# Patient Record
Sex: Female | Born: 1985 | Race: Black or African American | Hispanic: No | Marital: Single | Smoking: Former smoker
Health system: Southern US, Community
[De-identification: ages and names within clinical notes are randomized; demographics above are authoritative.]

## PROBLEM LIST (undated history)

## (undated) DIAGNOSIS — G932 Benign intracranial hypertension: Secondary | ICD-10-CM

## (undated) DIAGNOSIS — O24419 Gestational diabetes mellitus in pregnancy, unspecified control: Secondary | ICD-10-CM

## (undated) DIAGNOSIS — T7840XA Allergy, unspecified, initial encounter: Secondary | ICD-10-CM

## (undated) DIAGNOSIS — D649 Anemia, unspecified: Secondary | ICD-10-CM

## (undated) DIAGNOSIS — J45909 Unspecified asthma, uncomplicated: Secondary | ICD-10-CM

## (undated) DIAGNOSIS — F209 Schizophrenia, unspecified: Secondary | ICD-10-CM

## (undated) HISTORY — PX: WISDOM TOOTH EXTRACTION: SHX21

## (undated) HISTORY — DX: Unspecified asthma, uncomplicated: J45.909

## (undated) HISTORY — DX: Gestational diabetes mellitus in pregnancy, unspecified control: O24.419

## (undated) HISTORY — DX: Allergy, unspecified, initial encounter: T78.40XA

## (undated) HISTORY — PX: NO PAST SURGERIES: SHX2092

---

## 2017-01-17 DIAGNOSIS — R0789 Other chest pain: Secondary | ICD-10-CM | POA: Diagnosis not present

## 2017-01-17 DIAGNOSIS — F17210 Nicotine dependence, cigarettes, uncomplicated: Secondary | ICD-10-CM | POA: Diagnosis not present

## 2017-01-17 DIAGNOSIS — J45909 Unspecified asthma, uncomplicated: Secondary | ICD-10-CM | POA: Diagnosis not present

## 2017-01-28 DIAGNOSIS — M79672 Pain in left foot: Secondary | ICD-10-CM | POA: Diagnosis not present

## 2017-01-28 DIAGNOSIS — M7989 Other specified soft tissue disorders: Secondary | ICD-10-CM | POA: Diagnosis not present

## 2017-01-28 DIAGNOSIS — F17210 Nicotine dependence, cigarettes, uncomplicated: Secondary | ICD-10-CM | POA: Diagnosis not present

## 2017-02-12 DIAGNOSIS — R252 Cramp and spasm: Secondary | ICD-10-CM | POA: Diagnosis not present

## 2018-05-14 ENCOUNTER — Encounter: Payer: Self-pay | Admitting: Family Medicine

## 2018-05-14 ENCOUNTER — Ambulatory Visit (INDEPENDENT_AMBULATORY_CARE_PROVIDER_SITE_OTHER): Payer: BLUE CROSS/BLUE SHIELD | Admitting: Family Medicine

## 2018-05-14 ENCOUNTER — Other Ambulatory Visit: Payer: Self-pay

## 2018-05-14 VITALS — BP 114/73 | HR 76 | Temp 98.9°F | Ht 65.0 in | Wt 175.0 lb

## 2018-05-14 DIAGNOSIS — N760 Acute vaginitis: Secondary | ICD-10-CM

## 2018-05-14 DIAGNOSIS — Z30431 Encounter for routine checking of intrauterine contraceptive device: Secondary | ICD-10-CM

## 2018-05-14 DIAGNOSIS — N898 Other specified noninflammatory disorders of vagina: Secondary | ICD-10-CM

## 2018-05-14 DIAGNOSIS — B9689 Other specified bacterial agents as the cause of diseases classified elsewhere: Secondary | ICD-10-CM

## 2018-05-14 DIAGNOSIS — F439 Reaction to severe stress, unspecified: Secondary | ICD-10-CM

## 2018-05-14 LAB — WET PREP FOR TRICH, YEAST, CLUE
CLUE CELL EXAM: POSITIVE — AB
TRICHOMONAS EXAM: NEGATIVE
YEAST EXAM: NEGATIVE

## 2018-05-14 MED ORDER — METRONIDAZOLE 500 MG PO TABS: 500.0000 mg | ORAL_TABLET | Freq: Two times a day (BID) | ORAL | 0 refills | Status: DC

## 2018-05-14 NOTE — Patient Instructions (Addendum)
Shoulder Exercises Ask your health care provider which exercises are safe for you. Do exercises exactly as told by your health care provider and adjust them as directed. It is normal to feel mild stretching, pulling, tightness, or discomfort as you do these exercises, but you should stop right away if you feel sudden pain or your pain gets worse.Do not begin these exercises until told by your health care provider. RANGE OF MOTION EXERCISES These exercises warm up your muscles and joints and improve the movement and flexibility of your shoulder. These exercises also help to relieve pain, numbness, and tingling. These exercises involve stretching your injured shoulder directly. Exercise A: Pendulum  1. Stand near a wall or a surface that you can hold onto for balance. 2. Bend at the waist and let your left / right arm hang straight down. Use your other arm to support you. Keep your back straight and do not lock your knees. 3. Relax your left / right arm and shoulder muscles, and move your hips and your trunk so your left / right arm swings freely. Your arm should swing because of the motion of your body, not because you are using your arm or shoulder muscles. 4. Keep moving your body so your arm swings in the following directions, as told by your health care provider: ? Side to side. ? Forward and backward. ? In clockwise and counterclockwise circles. 5. Continue each motion for __________ seconds, or for as long as told by your health care provider. 6. Slowly return to the starting position. Repeat __________ times. Complete this exercise __________ times a day. Exercise B:Flexion, Standing  1. Stand and hold a broomstick, a cane, or a similar object. Place your hands a little more than shoulder-width apart on the object. Your left / right hand should be palm-up, and your other hand should be palm-down. 2. Keep your elbow straight and keep your shoulder muscles relaxed. Push the stick down with  your healthy arm to raise your left / right arm in front of your body, and then over your head until you feel a stretch in your shoulder. ? Avoid shrugging your shoulder while you raise your arm. Keep your shoulder blade tucked down toward the middle of your back. 3. Hold for __________ seconds. 4. Slowly return to the starting position. Repeat __________ times. Complete this exercise __________ times a day. Exercise C: Abduction, Standing 1. Stand and hold a broomstick, a cane, or a similar object. Place your hands a little more than shoulder-width apart on the object. Your left / right hand should be palm-up, and your other hand should be palm-down. 2. While keeping your elbow straight and your shoulder muscles relaxed, push the stick across your body toward your left / right side. Raise your left / right arm to the side of your body and then over your head until you feel a stretch in your shoulder. ? Do not raise your arm above shoulder height, unless your health care provider tells you to do that. ? Avoid shrugging your shoulder while you raise your arm. Keep your shoulder blade tucked down toward the middle of your back. 3. Hold for __________ seconds. 4. Slowly return to the starting position. Repeat __________ times. Complete this exercise __________ times a day. Exercise D:Internal Rotation  1. Place your left / right hand behind your back, palm-up. 2. Use your other hand to dangle an exercise band, a towel, or a similar object over your shoulder. Grasp the band with   your left / right hand so you are holding onto both ends. 3. Gently pull up on the band until you feel a stretch in the front of your left / right shoulder. ? Avoid shrugging your shoulder while you raise your arm. Keep your shoulder blade tucked down toward the middle of your back. 4. Hold for __________ seconds. 5. Release the stretch by letting go of the band and lowering your hands. Repeat __________ times. Complete  this exercise __________ times a day. STRETCHING EXERCISES These exercises warm up your muscles and joints and improve the movement and flexibility of your shoulder. These exercises also help to relieve pain, numbness, and tingling. These exercises are done using your healthy shoulder to help stretch the muscles of your injured shoulder. Exercise E: Corner Stretch (External Rotation and Abduction)  1. Stand in a doorway with one of your feet slightly in front of the other. This is called a staggered stance. If you cannot reach your forearms to the door frame, stand facing a corner of a room. 2. Choose one of the following positions as told by your health care provider: ? Place your hands and forearms on the door frame above your head. ? Place your hands and forearms on the door frame at the height of your head. ? Place your hands on the door frame at the height of your elbows. 3. Slowly move your weight onto your front foot until you feel a stretch across your chest and in the front of your shoulders. Keep your head and chest upright and keep your abdominal muscles tight. 4. Hold for __________ seconds. 5. To release the stretch, shift your weight to your back foot. Repeat __________ times. Complete this stretch __________ times a day. Exercise F:Extension, Standing 1. Stand and hold a broomstick, a cane, or a similar object behind your back. ? Your hands should be a little wider than shoulder-width apart. ? Your palms should face away from your back. 2. Keeping your elbows straight and keeping your shoulder muscles relaxed, move the stick away from your body until you feel a stretch in your shoulder. ? Avoid shrugging your shoulders while you move the stick. Keep your shoulder blade tucked down toward the middle of your back. 3. Hold for __________ seconds. 4. Slowly return to the starting position. Repeat __________ times. Complete this exercise __________ times a day. STRENGTHENING  EXERCISES These exercises build strength and endurance in your shoulder. Endurance is the ability to use your muscles for a long time, even after they get tired. Exercise G:External Rotation  1. Sit in a stable chair without armrests. 2. Secure an exercise band at elbow height on your left / right side. 3. Place a soft object, such as a folded towel or a small pillow, between your left / right upper arm and your body to move your elbow a few inches away (about 10 cm) from your side. 4. Hold the end of the band so it is tight and there is no slack. 5. Keeping your elbow pressed against the soft object, move your left / right forearm out, away from your abdomen. Keep your body steady so only your forearm moves. 6. Hold for __________ seconds. 7. Slowly return to the starting position. Repeat __________ times. Complete this exercise __________ times a day. Exercise H:Shoulder Abduction  1. Sit in a stable chair without armrests, or stand. 2. Hold a __________ weight in your left / right hand, or hold an exercise band with both hands.   3. Start with your arms straight down and your left / right palm facing in, toward your body. 4. Slowly lift your left / right hand out to your side. Do not lift your hand above shoulder height unless your health care provider tells you that this is safe. ? Keep your arms straight. ? Avoid shrugging your shoulder while you do this movement. Keep your shoulder blade tucked down toward the middle of your back. 5. Hold for __________ seconds. 6. Slowly lower your arm, and return to the starting position. Repeat __________ times. Complete this exercise __________ times a day. Exercise I:Shoulder Extension 1. Sit in a stable chair without armrests, or stand. 2. Secure an exercise band to a stable object in front of you where it is at shoulder height. 3. Hold one end of the exercise band in each hand. Your palms should face each other. 4. Straighten your elbows and  lift your hands up to shoulder height. 5. Step back, away from the secured end of the exercise band, until the band is tight and there is no slack. 6. Squeeze your shoulder blades together as you pull your hands down to the sides of your thighs. Stop when your hands are straight down by your sides. Do not let your hands go behind your body. 7. Hold for __________ seconds. 8. Slowly return to the starting position. Repeat __________ times. Complete this exercise __________ times a day. Exercise J:Standing Shoulder Row 1. Sit in a stable chair without armrests, or stand. 2. Secure an exercise band to a stable object in front of you so it is at waist height. 3. Hold one end of the exercise band in each hand. Your palms should be in a thumbs-up position. 4. Bend each of your elbows to an "L" shape (about 90 degrees) and keep your upper arms at your sides. 5. Step back until the band is tight and there is no slack. 6. Slowly pull your elbows back behind you. 7. Hold for __________ seconds. 8. Slowly return to the starting position. Repeat __________ times. Complete this exercise __________ times a day. Exercise K:Shoulder Press-Ups  1. Sit in a stable chair that has armrests. Sit upright, with your feet flat on the floor. 2. Put your hands on the armrests so your elbows are bent and your fingers are pointing forward. Your hands should be about even with the sides of your body. 3. Push down on the armrests and use your arms to lift yourself off of the chair. Straighten your elbows and lift yourself up as much as you comfortably can. ? Move your shoulder blades down, and avoid letting your shoulders move up toward your ears. ? Keep your feet on the ground. As you get stronger, your feet should support less of your body weight as you lift yourself up. 4. Hold for __________ seconds. 5. Slowly lower yourself back into the chair. Repeat __________ times. Complete this exercise __________ times a  day. Exercise L: Wall Push-Ups  1. Stand so you are facing a stable wall. Your feet should be about one arm-length away from the wall. 2. Lean forward and place your palms on the wall at shoulder height. 3. Keep your feet flat on the floor as you bend your elbows and lean forward toward the wall. 4. Hold for __________ seconds. 5. Straighten your elbows to push yourself back to the starting position. Repeat __________ times. Complete this exercise __________ times a day. This information is not intended to replace advice   given to you by your health care provider. Make sure you discuss any questions you have with your health care provider. Document Released: 06/08/2005 Document Revised: 04/18/2016 Document Reviewed: 04/05/2015 Elsevier Interactive Patient Education  2018 [place] and Stress Management Stress is a normal reaction to life events. It is what you feel when life demands more than you are used to or more than you can handle. Some stress can be useful. For example, the stress reaction can help you catch the last bus of the day, study for a test, or meet a deadline at work. But stress that occurs too often or for too long can cause problems. It can affect your emotional health and interfere with relationships and normal daily activities. Too much stress can weaken your immune system and increase your risk for physical illness. If you already have a medical problem, stress can make it worse. What are the causes? All sorts of life events may cause stress. An event that causes stress for one person may not be stressful for another person. Major life events commonly cause stress. These may be positive or negative. Examples include losing your job, moving into a new home, getting married, having a baby, or losing a loved one. Less obvious life events may also cause stress, especially if they occur day after day or in combination. Examples include working long hours, driving in  traffic, caring for children, being in debt, or being in a difficult relationship. What are the signs or symptoms? Stress may cause emotional symptoms including, the following:  Anxiety. This is feeling worried, afraid, on edge, overwhelmed, or out of control.  Anger. This is feeling irritated or impatient.  Depression. This is feeling sad, down, helpless, or guilty.  Difficulty focusing, remembering, or making decisions.  Stress may cause physical symptoms, including the following:  Aches and pains. These may affect your head, neck, back, stomach, or other areas of your body.  Tight muscles or clenched jaw.  Low energy or trouble sleeping.  Stress may cause unhealthy behaviors, including the following:  Eating to feel better (overeating) or skipping meals.  Sleeping too little, too much, or both.  Working too much or putting off tasks (procrastination).  Smoking, drinking alcohol, or using drugs to feel better.  How is this diagnosed? Stress is diagnosed through an assessment by your health care provider. Your health care provider will ask questions about your symptoms and any stressful life events.Your health care provider will also ask about your medical history and may order blood tests or other tests. Certain medical conditions and medicine can cause physical symptoms similar to stress. Mental illness can cause emotional symptoms and unhealthy behaviors similar to stress. Your health care provider may refer you to a mental health professional for further evaluation. How is this treated? Stress management is the recommended treatment for stress.The goals of stress management are reducing stressful life events and coping with stress in healthy ways. Techniques for reducing stressful life events include the following:  Stress identification. Self-monitor for stress and identify what causes stress for you. These skills may help you to avoid some stressful events.  Time  management. Set your priorities, keep a calendar of events, and learn to say "no." These tools can help you avoid making too many commitments.  Techniques for coping with stress include the following:  Rethinking the problem. Try to think realistically about stressful events rather than ignoring them or overreacting. Try to find the positives in a stressful situation  rather than focusing on the negatives.  Exercise. Physical exercise can release both physical and emotional tension. The key is to find a form of exercise you enjoy and do it regularly.  Relaxation techniques. These relax the body and mind. Examples include yoga, meditation, tai chi, biofeedback, deep breathing, progressive muscle relaxation, listening to music, being out in nature, journaling, and other hobbies. Again, the key is to find one or more that you enjoy and can do regularly.  Healthy lifestyle. Eat a balanced diet, get plenty of sleep, and do not smoke. Avoid using alcohol or drugs to relax.  Strong support network. Spend time with family, friends, or other people you enjoy being around.Express your feelings and talk things over with someone you trust.  Counseling or talktherapy with a mental health professional may be helpful if you are having difficulty managing stress on your own. Medicine is typically not recommended for the treatment of stress.Talk to your health care provider if you think you need medicine for symptoms of stress. Follow these instructions at home:  Keep all follow-up visits as directed by your health care provider.  Take all medicines as directed by your health care provider. Contact a health care provider if:  Your symptoms get worse or you start having new symptoms.  You feel overwhelmed by your problems and can no longer manage them on your own. Get help right away if:  You feel like hurting yourself or someone else. This information is not intended to replace advice given to you by  your health care provider. Make sure you discuss any questions you have with your health care provider. Document Released: 01/18/2001 Document Revised: 12/31/2015 Document Reviewed: 03/19/2013 Elsevier Interactive Patient Education  2017 [employer]  Bacterial Vaginosis Bacterial vaginosis is a vaginal infection that occurs when the normal balance of bacteria in the vagina is disrupted. It results from an overgrowth of certain bacteria. This is the most common vaginal infection among women ages 99-44. Because bacterial vaginosis increases your risk for STIs (sexually transmitted infections), getting treated can help reduce your risk for chlamydia, gonorrhea, herpes, and HIV (human immunodeficiency virus). Treatment is also important for preventing complications in pregnant women, because this condition can cause an early (premature) delivery. What are the causes? This condition is caused by an increase in harmful bacteria that are normally present in small amounts in the vagina. However, the reason that the condition develops is not fully understood. What increases the risk? The following factors may make you more likely to develop this condition:  Having a new sexual partner or multiple sexual partners.  Having unprotected sex.  Douching.  Having an intrauterine device (IUD).  Smoking.  Drug and alcohol abuse.  Taking certain antibiotic medicines.  Being pregnant.  You cannot get bacterial vaginosis from toilet seats, bedding, swimming pools, or contact with objects around you. What are the signs or symptoms? Symptoms of this condition include:  Grey or white vaginal discharge. The discharge can also be watery or foamy.  A fish-like odor with discharge, especially after sexual intercourse or during menstruation.  Itching in and around the vagina.  Burning or pain with urination.  Some women with bacterial vaginosis have no signs or symptoms. How is this diagnosed? This  condition is diagnosed based on:  Your medical history.  A physical exam of the vagina.  Testing a sample of vaginal fluid under a microscope to look for a large amount of bad bacteria or abnormal cells. Your health care provider  may use a cotton swab or a small wooden spatula to collect the sample.  How is this treated? This condition is treated with antibiotics. These may be given as a pill, a vaginal cream, or a medicine that is put into the vagina (suppository). If the condition comes back after treatment, a second round of antibiotics may be needed. Follow these instructions at home: Medicines  Take over-the-counter and prescription medicines only as told by your health care provider.  Take or use your antibiotic as told by your health care provider. Do not stop taking or using the antibiotic even if you start to feel better. General instructions  If you have a female sexual partner, tell her that you have a vaginal infection. She should see her health care provider and be treated if she has symptoms. If you have a female sexual partner, he does not need treatment.  During treatment: ? Avoid sexual activity until you finish treatment. ? Do not douche. ? Avoid alcohol as directed by your health care provider. ? Avoid breastfeeding as directed by your health care provider.  Drink enough water and fluids to keep your urine clear or pale yellow.  Keep the area around your vagina and rectum clean. ? Wash the area daily with warm water. ? Wipe yourself from front to back after using the toilet.  Keep all follow-up visits as told by your health care provider. This is important. How is this prevented?  Do not douche.  Wash the outside of your vagina with warm water only.  Use protection when having sex. This includes latex condoms and dental dams.  Limit how many sexual partners you have. To help prevent bacterial vaginosis, it is best to have sex with just one partner  (monogamous).  Make sure you and your sexual partner are tested for STIs.  Wear cotton or cotton-lined underwear.  Avoid wearing tight pants and pantyhose, especially during summer.  Limit the amount of alcohol that you drink.  Do not use any products that contain nicotine or tobacco, such as cigarettes and e-cigarettes. If you need help quitting, ask your health care provider.  Do not use illegal drugs. Where to find more information:  Centers for Disease Control and Prevention: AppraiserFraud.fi  American Sexual Health Association (ASHA): www.ashastd.org  [place] Department of Health and Financial controller, Office on Women's Health: DustingSprays.pl or SecuritiesCard.it Contact a health care provider if:  Your symptoms do not improve, even after treatment.  You have more discharge or pain when urinating.  You have a fever.  You have pain in your abdomen.  You have pain during sex.  You have vaginal bleeding between periods. Summary  Bacterial vaginosis is a vaginal infection that occurs when the normal balance of bacteria in the vagina is disrupted.  Because bacterial vaginosis increases your risk for STIs (sexually transmitted infections), getting treated can help reduce your risk for chlamydia, gonorrhea, herpes, and HIV (human immunodeficiency virus). Treatment is also important for preventing complications in pregnant women, because the condition can cause an early (premature) delivery.  This condition is treated with antibiotic medicines. These may be given as a pill, a vaginal cream, or a medicine that is put into the vagina (suppository). This information is not intended to replace advice given to you by your health care provider. Make sure you discuss any questions you have with your health care provider. Document Released: 07/25/2005 Document Revised: 11/28/2016 Document Reviewed: 04/09/2016 Elsevier Interactive Patient  Education  [employer].

## 2018-05-14 NOTE — Progress Notes (Signed)
BP 114/73   Pulse 76   Temp 98.9 F (37.2 C) (Oral)   Ht 5\' 5"  (1.651 m)   Wt 175 lb (79.4 kg)   SpO2 94%   BMI 29.12 kg/m    Subjective:    Patient ID: Caitlin Ortega, female    DOB: 28-Feb-1986, 32 y.o.   MRN: [id number]  HPI: Caitlin Ortega is a 32 y.o. female who presents today to establish care  Chief Complaint  Patient presents with  . Anxiety  . New Patient (Initial Visit)    pt would like to discuss about her mental health, IUD and having hard times to relax   CONTRACEPTION CONCERNS- has been having issues with BV with her IUD and is concerned with it, has some pelvic pain. Has cramps for about the last year, worse after sex- has the paraguard IUD Contraception: IUD Previous contraception: OCP- got pregnant now, IUD  Sexual activity: monogamous Average interval between menses: 1x a month Length of menses: 7 days Flow: heavy Dysmenorrhea: yes  VAGINAL DISCHARGE Duration: about a year Discharge description: thin clear  Pruritus: no Dysuria: no Malodorous: yes Urinary frequency: no Fevers: no Abdominal pain: yes  Sexual activity: monogamous History of sexually transmitted diseases: no Recent antibiotic use: no Context: recurrent BV  Treatments attempted: metronidazole  ANXIETY/STRESS Duration: since she was a teen- has noticed that her shoulders are very tense Anxious mood: yes  Excessive worrying: yes Irritability: no  Sweating: no Nausea: no Palpitations:no Hyperventilation: no Panic attacks: no Agoraphobia: no  Obscessions/compulsions: no Depressed mood: no Depression screen PHQ 2/9 05/14/2018  Decreased Interest 1  Down, Depressed, Hopeless 1  PHQ - 2 Score 2  Altered sleeping 0  Tired, decreased energy 2  Change in appetite 1  Feeling bad or failure about yourself  3  Trouble concentrating 1  Moving slowly or fidgety/restless 1  Suicidal thoughts 0  PHQ-9 Score 10  Difficult doing work/chores Not difficult at all   GAD 7 : Generalized  Anxiety Score 05/14/2018  Nervous, Anxious, on Edge 2  Control/stop worrying 2  Worry too much - different things 3  Trouble relaxing 3  Restless 0  Easily annoyed or irritable 1  Afraid - awful might happen 2  Total GAD 7 Score 13  Anxiety Difficulty Somewhat difficult   Anhedonia: no Weight changes: no Insomnia: no   Hypersomnia: no Fatigue/loss of energy: no Feelings of worthlessness: no Feelings of guilt: no Impaired concentration/indecisiveness: no Suicidal ideations: no  Crying spells: no Recent Stressors/Life Changes: no   Relationship problems: no   Family stress: yes     Financial stress: no    Job stress: no    Recent death/loss: no  Active Ambulatory Problems    Diagnosis Date Noted  . No Active Ambulatory Problems   Resolved Ambulatory Problems    Diagnosis Date Noted  . No Resolved Ambulatory Problems   Past Medical History:  Diagnosis Date  . Allergy   . Asthma    History reviewed. No pertinent surgical history. Outpatient Encounter Medications as of 05/14/2018  Medication Sig  . levonorgestrel (MIRENA) 20 MCG/24HR IUD by Intrauterine route.  . metroNIDAZOLE (FLAGYL) 500 MG tablet Take 1 tablet (500 mg total) by mouth 2 (two) times daily.   No facility-administered encounter medications on file as of 05/14/2018.    No Known Allergies Social History   Socioeconomic History  . Marital status: Not on file    Spouse name: Not on file  . Number of  children: Not on file  . Years of education: Not on file  . Highest education level: Not on file  Occupational History  . Not on file  Social Needs  . Financial resource strain: Not on file  . Food insecurity:    Worry: Not on file    Inability: Not on file  . Transportation needs:    Medical: Not on file    Non-medical: Not on file  Tobacco Use  . Smoking status: Former Research scientist (life sciences)  . Smokeless tobacco: Never Used  Substance and Sexual Activity  . Alcohol use: Not Currently  . Drug use: Never  .  Sexual activity: Yes  Lifestyle  . Physical activity:    Days per week: Not on file    Minutes per session: Not on file  . Stress: Not on file  Relationships  . Social connections:    Talks on phone: Not on file    Gets together: Not on file    Attends religious service: Not on file    Active member of club or organization: Not on file    Attends meetings of clubs or organizations: Not on file    Relationship status: Not on file  Other Topics Concern  . Not on file  Social History Narrative  . Not on file   Family History  Problem Relation Age of Onset  . Hypertension Mother   . Hypertension Sister   . Heart disease Maternal Grandfather   . Diabetes Maternal Grandfather     Review of Systems  Constitutional: Negative.   Respiratory: Negative.   Cardiovascular: Negative.   Musculoskeletal: Positive for myalgias. Negative for arthralgias, back pain, gait problem, joint swelling, neck pain and neck stiffness.  Skin: Negative.   Psychiatric/Behavioral: Negative.     Per HPI unless specifically indicated above     Objective:    BP 114/73   Pulse 76   Temp 98.9 F (37.2 C) (Oral)   Ht 5\' 5"  (1.651 m)   Wt 175 lb (79.4 kg)   SpO2 94%   BMI 29.12 kg/m   Wt Readings from Last 3 Encounters:  05/14/18 175 lb (79.4 kg)    Physical Exam  Constitutional: She is oriented to person, place, and time. She appears well-developed and well-nourished. No distress.  HENT:  Head: Normocephalic and atraumatic.  Right Ear: Hearing normal.  Left Ear: Hearing normal.  Nose: Nose normal.  Eyes: Conjunctivae and lids are normal. Right eye exhibits no discharge. Left eye exhibits no discharge. No scleral icterus.  Cardiovascular: Normal rate, regular rhythm, normal heart sounds and intact distal pulses. Exam reveals no gallop and no friction rub.  No murmur heard. Pulmonary/Chest: Effort normal and breath sounds normal. No stridor. No respiratory distress. She has no wheezes. She has  no rales. She exhibits no tenderness.  Genitourinary: Rectal exam shows guaiac negative stool. Vaginal discharge found.  Musculoskeletal: Normal range of motion.  Neurological: She is alert and oriented to person, place, and time.  Skin: Skin is warm, dry and intact. Capillary refill takes less than 2 seconds. No rash noted. She is not diaphoretic. No erythema. No pallor.  Psychiatric: She has a normal mood and affect. Her speech is normal and behavior is normal. Judgment and thought content normal. Cognition and memory are normal.  Nursing note and vitals reviewed.   No results found for this or any previous visit.    Assessment & Plan:   Problem List Items Addressed This Visit    None  Visit Diagnoses    BV (bacterial vaginosis)    -  Primary   Wil treat with metronidazole. Call with any concerns.    Relevant Medications   metroNIDAZOLE (FLAGYL) 500 MG tablet   Vaginal discharge       WIll check wet prep. Call with any concerns. +BV   Relevant Orders   WET PREP FOR TRICH, YEAST, CLUE   Stress       Discussed breathing exercises and shoulder stretches. Continue to monitor. Call with any concerns.    Encounter for management of intrauterine contraceptive device (IUD), unspecified IUD management type       Will continue with paraguard. Call with any concerns.        Follow up plan: Return if symptoms worsen or fail to improve, for Physical.

## 2018-11-09 ENCOUNTER — Encounter: Payer: BLUE CROSS/BLUE SHIELD | Admitting: Family Medicine

## 2019-01-28 ENCOUNTER — Other Ambulatory Visit: Payer: Self-pay

## 2019-01-28 ENCOUNTER — Ambulatory Visit (INDEPENDENT_AMBULATORY_CARE_PROVIDER_SITE_OTHER): Payer: Medicaid Other | Admitting: Family Medicine

## 2019-01-28 ENCOUNTER — Encounter: Payer: Self-pay | Admitting: Family Medicine

## 2019-01-28 ENCOUNTER — Other Ambulatory Visit (HOSPITAL_COMMUNITY)
Admission: RE | Admit: 2019-01-28 | Discharge: 2019-01-28 | Disposition: A | Discharge status: Home / Self Care | Payer: Medicaid Other | Source: Ambulatory Visit | Attending: Family Medicine | Admitting: Family Medicine

## 2019-01-28 VITALS — BP 106/72 | HR 57 | Temp 99.3°F | Ht 65.0 in | Wt 190.0 lb

## 2019-01-28 DIAGNOSIS — Z124 Encounter for screening for malignant neoplasm of cervix: Secondary | ICD-10-CM

## 2019-01-28 DIAGNOSIS — Z0000 Encounter for general adult medical examination without abnormal findings: Secondary | ICD-10-CM | POA: Diagnosis not present

## 2019-01-28 DIAGNOSIS — M545 Low back pain, unspecified: Secondary | ICD-10-CM

## 2019-01-28 DIAGNOSIS — Z30431 Encounter for routine checking of intrauterine contraceptive device: Secondary | ICD-10-CM | POA: Diagnosis not present

## 2019-01-28 LAB — UA/M W/RFLX CULTURE, ROUTINE
Bilirubin, UA: NEGATIVE
Glucose, UA: NEGATIVE
Ketones, UA: NEGATIVE
Leukocytes,UA: NEGATIVE
Nitrite, UA: NEGATIVE
Protein,UA: NEGATIVE
RBC, UA: NEGATIVE
Specific Gravity, UA: 1.025 (ref 1.005–1.030)
Urobilinogen, Ur: 0.2 mg/dL (ref 0.2–1.0)
pH, UA: 5 (ref 5.0–7.5)

## 2019-01-28 NOTE — Progress Notes (Signed)
BP 106/72   Pulse (!) 57   Temp 99.3 F (37.4 C) (Oral)   Ht 5\' 5"  (1.651 m)   Wt 190 lb (86.2 kg)   SpO2 98%   BMI 31.62 kg/m    Subjective:    Patient ID: Caitlin Ortega, female    DOB: 08/30/1985, 33 y.o.   MRN: [id number]  HPI: Caitlin Ortega is a 33 y.o. female presenting on 01/28/2019 for comprehensive medical examination. Current medical complaints include:  Has been having some issues with back pain. Would like to see a chiropractor. Would like referral for that.   She is not sure if she has a mirena or paraguard IUD- was going to have mirena placed at [facility] in 2013, but then states that she had another placed through [facility] in 2013. She really wanted the paragard and thought that was what was placed, but now she's unsure.  Menopausal Symptoms: no  Depression Screen done today and results listed below:  Depression screen [facility] 2/9 01/28/2019 05/14/2018  Decreased Interest 0 1  Down, Depressed, Hopeless 0 1  PHQ - 2 Score 0 2  Altered sleeping 3 0  Tired, decreased energy 1 2  Change in appetite 0 1  Feeling bad or failure about yourself  0 3  Trouble concentrating 0 1  Moving slowly or fidgety/restless 0 1  Suicidal thoughts 0 0  PHQ-9 Score 4 10  Difficult doing work/chores Not difficult at all Not difficult at all    Past Medical History:  Past Medical History:  Diagnosis Date  . Allergy   . Asthma     Surgical History:  History reviewed. No pertinent surgical history.  Medications:  Current Outpatient Medications on File Prior to Visit  Medication Sig  . IBUPROFEN PO Take by mouth daily. 600 mg   No current facility-administered medications on file prior to visit.     Allergies:  No Known Allergies  Social History:  Social History   Socioeconomic History  . Marital status: Single    Spouse name: Not on file  . Number of children: Not on file  . Years of education: Not on file  . Highest education level: Not on file   Occupational History  . Not on file  Social Needs  . Financial resource strain: Not on file  . Food insecurity    Worry: Not on file    Inability: Not on file  . Transportation needs    Medical: Not on file    Non-medical: Not on file  Tobacco Use  . Smoking status: Former Research scientist (life sciences)  . Smokeless tobacco: Never Used  Substance and Sexual Activity  . Alcohol use: Not Currently  . Drug use: Never  . Sexual activity: Yes  Lifestyle  . Physical activity    Days per week: Not on file    Minutes per session: Not on file  . Stress: Not on file  Relationships  . Social Herbalist on phone: Not on file    Gets together: Not on file    Attends religious service: Not on file    Active member of club or organization: Not on file    Attends meetings of clubs or organizations: Not on file    Relationship status: Not on file  . Intimate partner violence    Fear of current or ex partner: Not on file    Emotionally abused: Not on file    Physically abused: Not on file  Forced sexual activity: Not on file  Other Topics Concern  . Not on file  Social History Narrative  . Not on file   Social History   Tobacco Use  Smoking Status Former Smoker  Smokeless Tobacco Never Used   Social History   Substance and Sexual Activity  Alcohol Use Not Currently    Family History:  Family History  Problem Relation Age of Onset  . Hypertension Mother   . Hypertension Sister   . Heart disease Maternal Grandfather   . Diabetes Maternal Grandfather     Past medical history, surgical history, medications, allergies, family history and social history reviewed with patient today and changes made to appropriate areas of the chart.   Review of Systems  Constitutional: Negative.   HENT: Negative.   Eyes: Negative.   Respiratory: Negative.   Cardiovascular: Negative.   Gastrointestinal: Negative.   Genitourinary: Negative.        Heavy menses and pain in her belly occasionally   Musculoskeletal: Positive for back pain. Negative for falls, joint pain, myalgias and neck pain.  Skin: Negative.   Neurological: Negative.   Endo/Heme/Allergies: Negative.   Psychiatric/Behavioral: Negative.     All other ROS negative except what is listed above and in the HPI.      Objective:    BP 106/72   Pulse (!) 57   Temp 99.3 F (37.4 C) (Oral)   Ht 5\' 5"  (1.651 m)   Wt 190 lb (86.2 kg)   SpO2 98%   BMI 31.62 kg/m   Wt Readings from Last 3 Encounters:  01/28/19 190 lb (86.2 kg)  05/14/18 175 lb (79.4 kg)    Physical Exam Vitals signs and nursing note reviewed. Exam conducted with a chaperone present.  Constitutional:      General: She is not in acute distress.    Appearance: Normal appearance. She is not ill-appearing, toxic-appearing or diaphoretic.  HENT:     Head: Normocephalic and atraumatic.     Right Ear: Tympanic membrane, ear canal and external ear normal. There is no impacted cerumen.     Left Ear: Tympanic membrane, ear canal and external ear normal. There is no impacted cerumen.     Nose: Nose normal. No congestion or rhinorrhea.     Mouth/Throat:     Mouth: Mucous membranes are moist.     Pharynx: Oropharynx is clear. No oropharyngeal exudate or posterior oropharyngeal erythema.  Eyes:     General: No scleral icterus.       Right eye: No discharge.        Left eye: No discharge.     Extraocular Movements: Extraocular movements intact.     Conjunctiva/sclera: Conjunctivae normal.     Pupils: Pupils are equal, round, and reactive to light.  Neck:     Musculoskeletal: Normal range of motion and neck supple. No neck rigidity or muscular tenderness.     Vascular: No carotid bruit.  Cardiovascular:     Rate and Rhythm: Normal rate and regular rhythm.     Pulses: Normal pulses.     Heart sounds: No murmur. No friction rub. No gallop.   Pulmonary:     Effort: Pulmonary effort is normal. No respiratory distress.     Breath sounds: Normal breath  sounds. No stridor. No wheezing, rhonchi or rales.  Chest:     Chest wall: No tenderness.     Breasts:        Right: Normal. No swelling, bleeding, inverted nipple, mass,  nipple discharge, skin change or tenderness.        Left: Normal. No swelling, bleeding, inverted nipple, mass, nipple discharge, skin change or tenderness.  Abdominal:     General: Abdomen is flat. Bowel sounds are normal. There is no distension.     Palpations: Abdomen is soft. There is no mass.     Tenderness: There is no abdominal tenderness. There is no right CVA tenderness, left CVA tenderness, guarding or rebound.     Hernia: No hernia is present.  Genitourinary:    Labia:        Right: No rash, tenderness, lesion or injury.        Left: No rash, tenderness, lesion or injury.      Urethra: No prolapse, urethral pain, urethral swelling or urethral lesion.     Vagina: Normal. No signs of injury and foreign body. No vaginal discharge, erythema, tenderness, bleeding, lesions or prolapsed vaginal walls.     Cervix: Normal. No eversion.  Musculoskeletal:        General: No swelling, tenderness, deformity or signs of injury.     Right lower leg: No edema.     Left lower leg: No edema.  Lymphadenopathy:     Cervical: No cervical adenopathy.  Skin:    General: Skin is warm and dry.     Capillary Refill: Capillary refill takes less than 2 seconds.     Coloration: Skin is not jaundiced or pale.     Findings: No bruising, erythema, lesion or rash.  Neurological:     General: No focal deficit present.     Mental Status: She is alert and oriented to person, place, and time. Mental status is at baseline.     Cranial Nerves: No cranial nerve deficit.     Sensory: No sensory deficit.     Motor: No weakness.     Coordination: Coordination normal.     Gait: Gait normal.     Deep Tendon Reflexes: Reflexes normal.  Psychiatric:        Mood and Affect: Mood normal.        Behavior: Behavior normal.        Thought Content:  Thought content normal.        Judgment: Judgment normal.     Results for orders placed or performed in visit on 05/14/18  WET PREP FOR [place], YEAST, CLUE   Specimen: Vaginal Fluid   VAGINAL FLUI  Result Value Ref Range   Trichomonas Exam Negative Negative   Yeast Exam Negative Negative   Clue Cell Exam Positive (A) Negative      Assessment & Plan:   Problem List Items Addressed This Visit    None    Visit Diagnoses    Routine general medical examination at a health care facility    -  Primary   Vaccines updated. Screening labs checked today. Pap done. Continue diet and exercise. Continue to monitor. Call with any concerns.    Relevant Orders   CBC with Differential/Platelet   Comprehensive metabolic panel   Lipid Panel w/o Chol/HDL Ratio   TSH   HIV Antibody (routine testing w rflx)   UA/M w/rflx Culture, Routine   Screening for cervical cancer       Pap done today, await results.    Relevant Orders   Cytology - PAP   Low back pain without sciatica, unspecified back pain laterality, unspecified chronicity       Would like to see chiropractor. Referral generated today.  Relevant Medications   IBUPROFEN PO   Other Relevant Orders   Ambulatory referral to Chiropractic   Encounter for management of intrauterine contraceptive device (IUD), unspecified IUD management type       Will double check on type of IUD she had placed- records release for [facility] health department signed today.       Follow up plan: Return in about 1 year (around 01/28/2020) for Physical.   LABORATORY TESTING:  - Pap smear: pap done  IMMUNIZATIONS:   - Tdap: Tetanus vaccination status reviewed: last tetanus booster within 10 years, Td vaccination indicated and given today. - Influenza: Postponed to flu season - Pneumovax: Not applicable - HPV: Up to date  PATIENT COUNSELING:   Advised to take 1 mg of folate supplement per day if capable of pregnancy.   Sexuality: Discussed sexually  transmitted diseases, partner selection, use of condoms, avoidance of unintended pregnancy  and contraceptive alternatives.   Advised to avoid cigarette smoking.  I discussed with the patient that most people either abstain from alcohol or drink within safe limits (<=14/week and <=4 drinks/occasion for males, <=7/weeks and <= 3 drinks/occasion for females) and that the risk for alcohol disorders and other health effects rises proportionally with the number of drinks per week and how often a drinker exceeds daily limits.  Discussed cessation/primary prevention of drug use and availability of treatment for abuse.   Diet: Encouraged to adjust caloric intake to maintain  or achieve ideal body weight, to reduce intake of dietary saturated fat and total fat, to limit sodium intake by avoiding high sodium foods and not adding table salt, and to maintain adequate dietary potassium and calcium preferably from fresh fruits, vegetables, and low-fat dairy products.    stressed the importance of regular exercise  Injury prevention: Discussed safety belts, safety helmets, smoke detector, smoking near bedding or upholstery.   Dental health: Discussed importance of regular tooth brushing, flossing, and dental visits.    NEXT PREVENTATIVE PHYSICAL DUE IN 1 YEAR. Return in about 1 year (around 01/28/2020) for Physical.

## 2019-01-28 NOTE — Patient Instructions (Signed)

## 2019-01-29 ENCOUNTER — Telehealth: Payer: Self-pay | Admitting: Family Medicine

## 2019-01-29 DIAGNOSIS — N920 Excessive and frequent menstruation with regular cycle: Secondary | ICD-10-CM

## 2019-01-29 LAB — COMPREHENSIVE METABOLIC PANEL
ALT: 37 IU/L — ABNORMAL HIGH (ref 0–32)
AST: 31 IU/L (ref 0–40)
Albumin/Globulin Ratio: 1.7 (ref 1.2–2.2)
Albumin: 4.7 g/dL (ref 3.9–5.0)
Alkaline Phosphatase: 76 IU/L (ref 39–117)
BUN/Creatinine Ratio: 20 (ref 9–23)
BUN: 13 mg/dL (ref 6–20)
Bilirubin Total: 0.4 mg/dL (ref 0.0–1.2)
CO2: 20 mmol/L (ref 20–29)
Calcium: 10 mg/dL (ref 8.7–10.2)
Chloride: 103 mmol/L (ref 96–106)
Creatinine, Ser: 0.65 mg/dL (ref 0.57–1.00)
GFR calc Af Amer: 138 mL/min/{1.73_m2} (ref >59)
GFR calc non Af Amer: 120 mL/min/{1.73_m2} (ref >59)
Globulin, Total: 2.8 g/dL (ref 1.5–4.5)
Glucose: 80 mg/dL (ref 65–99)
Potassium: 4 mmol/L (ref 3.5–5.2)
Sodium: 137 mmol/L (ref 134–144)
Total Protein: 7.5 g/dL (ref 6.0–8.5)

## 2019-01-29 LAB — CBC WITH DIFFERENTIAL/PLATELET
Basophils Absolute: 0.1 10*3/uL (ref 0.0–0.2)
Basos: 1 %
EOS (ABSOLUTE): 0.2 10*3/uL (ref 0.0–0.4)
Eos: 3 %
Hematocrit: 34.6 % (ref 34.0–46.6)
Hemoglobin: 10.8 g/dL — ABNORMAL LOW (ref 11.1–15.9)
Immature Grans (Abs): 0 10*3/uL (ref 0.0–0.1)
Immature Granulocytes: 0 %
Lymphocytes Absolute: 2.7 10*3/uL (ref 0.7–3.1)
Lymphs: 39 %
MCH: 23.7 pg — ABNORMAL LOW (ref 26.6–33.0)
MCHC: 31.2 g/dL — ABNORMAL LOW (ref 31.5–35.7)
MCV: 76 fL — ABNORMAL LOW (ref 79–97)
Monocytes Absolute: 0.4 10*3/uL (ref 0.1–0.9)
Monocytes: 6 %
Neutrophils Absolute: 3.5 10*3/uL (ref 1.4–7.0)
Neutrophils: 51 %
Platelets: 334 10*3/uL (ref 150–450)
RBC: 4.56 x10E6/uL (ref 3.77–5.28)
RDW: 15.6 % — ABNORMAL HIGH (ref 11.7–15.4)
WBC: 6.9 10*3/uL (ref 3.4–10.8)

## 2019-01-29 LAB — LIPID PANEL W/O CHOL/HDL RATIO
Cholesterol, Total: 173 mg/dL (ref 100–199)
HDL: 47 mg/dL (ref >39)
LDL Calculated: 110 mg/dL — ABNORMAL HIGH (ref 0–99)
Triglycerides: 80 mg/dL (ref 0–149)
VLDL Cholesterol Cal: 16 mg/dL (ref 5–40)

## 2019-01-29 LAB — TSH: TSH: 0.926 u[IU]/mL (ref 0.450–4.500)

## 2019-01-29 LAB — HIV ANTIBODY (ROUTINE TESTING W REFLEX): HIV Screen 4th Generation wRfx: NONREACTIVE

## 2019-01-29 MED ORDER — FERROUS SULFATE 325 (65 FE) MG PO TABS: 325.0000 mg | ORAL_TABLET | Freq: Every day | ORAL | 3 refills | Status: DC

## 2019-01-29 NOTE — Telephone Encounter (Signed)
Please send iron into [facility] Caitlin Ortega

## 2019-01-29 NOTE — Telephone Encounter (Signed)
Please let her know that her labs came back normal except she's a little anemic- I'm going to get her set up for the [place] to make sure she doesn't have fibroids, so they will call her. i've also sent in a iron supplement for her. That should help. Everything else looks great! Thanks!

## 2019-01-30 LAB — CYTOLOGY - PAP
Adequacy: ABSENT
Diagnosis: NEGATIVE
HPV: NOT DETECTED

## 2019-01-31 ENCOUNTER — Encounter: Payer: Self-pay | Admitting: Family Medicine

## 2019-02-01 ENCOUNTER — Telehealth: Payer: Self-pay | Admitting: Family Medicine

## 2019-02-01 NOTE — Telephone Encounter (Signed)
Spoke with Caitlin Ortega who is handling the Pre Certification and has also spoke with [place].  Routing to [place] for documentation on Pre Cert for [place]

## 2019-02-01 NOTE — Telephone Encounter (Signed)
I have not been informed that a PA needed to be done. If there is something I need to fill out- please let me know. Patient has very heavy, painful periods and is anemic because of them, which is why we are trying to get the [place].

## 2019-02-01 NOTE — Telephone Encounter (Signed)
Copied from [place] [id number]. Topic: General - Inquiry >> Feb 01, 2019  8:58 AM Mathis Bud wrote: Reason for CRM: Gabriel Cirri called from [facility] center [facility] regarding patient app 6/29 with [facility], Gabriel Cirri states PCP did not do PA for  US PELVIS COMPLETE Call back [place] # [phone] [phone]

## 2019-02-04 ENCOUNTER — Other Ambulatory Visit: Payer: Self-pay

## 2019-02-04 ENCOUNTER — Ambulatory Visit
Admission: RE | Admit: 2019-02-04 | Discharge: 2019-02-04 | Disposition: A | Discharge status: Home / Self Care | Payer: Medicaid Other | Source: Ambulatory Visit | Attending: Family Medicine | Admitting: Family Medicine

## 2019-02-04 ENCOUNTER — Telehealth: Payer: Self-pay | Admitting: Family Medicine

## 2019-02-04 DIAGNOSIS — N920 Excessive and frequent menstruation with regular cycle: Secondary | ICD-10-CM | POA: Diagnosis not present

## 2019-02-04 DIAGNOSIS — D259 Leiomyoma of uterus, unspecified: Secondary | ICD-10-CM | POA: Diagnosis not present

## 2019-02-04 DIAGNOSIS — N83291 Other ovarian cyst, right side: Secondary | ICD-10-CM | POA: Diagnosis not present

## 2019-02-04 NOTE — Telephone Encounter (Signed)
Patient returned my call and was given results. Patient requested the size of her fibroid. Dr. Wynetta Emery was standing beside me when patient called back so Dr. Wynetta Emery provided the size for the patient. Patient states that since it is fairly small in size, she is going to hold off on GYN right now. States she will let us know if she changes her mind.

## 2019-02-04 NOTE — Telephone Encounter (Signed)
Called and left patient a VM asking for her to please return my call.  

## 2019-02-04 NOTE — Telephone Encounter (Signed)
Please let her know that she does have a fibroid, which is likely why her periods are so heavy and uncomfortable. We can get her into GYN to talk about her options for that if she'd like. She has a little cyst on her ovary from ovulating, everything else looks normal. Thanks!

## 2019-02-19 DIAGNOSIS — M546 Pain in thoracic spine: Secondary | ICD-10-CM | POA: Diagnosis not present

## 2019-02-19 DIAGNOSIS — M9902 Segmental and somatic dysfunction of thoracic region: Secondary | ICD-10-CM | POA: Diagnosis not present

## 2019-02-19 DIAGNOSIS — M531 Cervicobrachial syndrome: Secondary | ICD-10-CM | POA: Diagnosis not present

## 2019-02-19 DIAGNOSIS — M9901 Segmental and somatic dysfunction of cervical region: Secondary | ICD-10-CM | POA: Diagnosis not present

## 2019-02-21 DIAGNOSIS — M531 Cervicobrachial syndrome: Secondary | ICD-10-CM | POA: Diagnosis not present

## 2019-02-21 DIAGNOSIS — M9902 Segmental and somatic dysfunction of thoracic region: Secondary | ICD-10-CM | POA: Diagnosis not present

## 2019-02-21 DIAGNOSIS — M9901 Segmental and somatic dysfunction of cervical region: Secondary | ICD-10-CM | POA: Diagnosis not present

## 2019-02-21 DIAGNOSIS — M546 Pain in thoracic spine: Secondary | ICD-10-CM | POA: Diagnosis not present

## 2019-02-25 DIAGNOSIS — M546 Pain in thoracic spine: Secondary | ICD-10-CM | POA: Diagnosis not present

## 2019-02-25 DIAGNOSIS — M531 Cervicobrachial syndrome: Secondary | ICD-10-CM | POA: Diagnosis not present

## 2019-02-25 DIAGNOSIS — M9902 Segmental and somatic dysfunction of thoracic region: Secondary | ICD-10-CM | POA: Diagnosis not present

## 2019-02-25 DIAGNOSIS — M9901 Segmental and somatic dysfunction of cervical region: Secondary | ICD-10-CM | POA: Diagnosis not present

## 2019-02-27 DIAGNOSIS — M9901 Segmental and somatic dysfunction of cervical region: Secondary | ICD-10-CM | POA: Diagnosis not present

## 2019-02-27 DIAGNOSIS — M9902 Segmental and somatic dysfunction of thoracic region: Secondary | ICD-10-CM | POA: Diagnosis not present

## 2019-02-27 DIAGNOSIS — M531 Cervicobrachial syndrome: Secondary | ICD-10-CM | POA: Diagnosis not present

## 2019-02-27 DIAGNOSIS — M546 Pain in thoracic spine: Secondary | ICD-10-CM | POA: Diagnosis not present

## 2019-03-04 DIAGNOSIS — M9902 Segmental and somatic dysfunction of thoracic region: Secondary | ICD-10-CM | POA: Diagnosis not present

## 2019-03-04 DIAGNOSIS — M9901 Segmental and somatic dysfunction of cervical region: Secondary | ICD-10-CM | POA: Diagnosis not present

## 2019-03-04 DIAGNOSIS — M531 Cervicobrachial syndrome: Secondary | ICD-10-CM | POA: Diagnosis not present

## 2019-03-04 DIAGNOSIS — M546 Pain in thoracic spine: Secondary | ICD-10-CM | POA: Diagnosis not present

## 2019-03-07 DIAGNOSIS — M546 Pain in thoracic spine: Secondary | ICD-10-CM | POA: Diagnosis not present

## 2019-03-07 DIAGNOSIS — M531 Cervicobrachial syndrome: Secondary | ICD-10-CM | POA: Diagnosis not present

## 2019-03-07 DIAGNOSIS — M9902 Segmental and somatic dysfunction of thoracic region: Secondary | ICD-10-CM | POA: Diagnosis not present

## 2019-03-07 DIAGNOSIS — M9901 Segmental and somatic dysfunction of cervical region: Secondary | ICD-10-CM | POA: Diagnosis not present

## 2019-03-13 DIAGNOSIS — M531 Cervicobrachial syndrome: Secondary | ICD-10-CM | POA: Diagnosis not present

## 2019-03-13 DIAGNOSIS — M546 Pain in thoracic spine: Secondary | ICD-10-CM | POA: Diagnosis not present

## 2019-03-13 DIAGNOSIS — M9902 Segmental and somatic dysfunction of thoracic region: Secondary | ICD-10-CM | POA: Diagnosis not present

## 2019-03-13 DIAGNOSIS — M9901 Segmental and somatic dysfunction of cervical region: Secondary | ICD-10-CM | POA: Diagnosis not present

## 2019-03-20 DIAGNOSIS — M9902 Segmental and somatic dysfunction of thoracic region: Secondary | ICD-10-CM | POA: Diagnosis not present

## 2019-03-20 DIAGNOSIS — M531 Cervicobrachial syndrome: Secondary | ICD-10-CM | POA: Diagnosis not present

## 2019-03-20 DIAGNOSIS — M9901 Segmental and somatic dysfunction of cervical region: Secondary | ICD-10-CM | POA: Diagnosis not present

## 2019-03-20 DIAGNOSIS — M546 Pain in thoracic spine: Secondary | ICD-10-CM | POA: Diagnosis not present

## 2019-04-09 ENCOUNTER — Telehealth: Payer: Self-pay | Admitting: Family Medicine

## 2019-04-09 NOTE — Telephone Encounter (Signed)
Routing to provider  

## 2019-04-09 NOTE — Telephone Encounter (Signed)
Pt called to ask if Dr Wynetta Emery could call her to answer some questions re: pt's IUD, specifically whether the IUD was to be in place for 41yrs or 97yrs.  Please call pt to advise: [phone]

## 2019-04-11 NOTE — Telephone Encounter (Signed)
Called [facility], they have to have a signed release. Notified patient she will come in and sign it tomorrow.

## 2019-04-11 NOTE — Telephone Encounter (Signed)
We had requested records- I don't think we ever got them. [facility]- can we confirm whether she has a paraguard or mirana- both are on the chart.

## 2019-08-21 DIAGNOSIS — F411 Generalized anxiety disorder: Secondary | ICD-10-CM | POA: Diagnosis not present

## 2019-08-21 DIAGNOSIS — F331 Major depressive disorder, recurrent, moderate: Secondary | ICD-10-CM | POA: Diagnosis not present

## 2019-09-19 DIAGNOSIS — F331 Major depressive disorder, recurrent, moderate: Secondary | ICD-10-CM | POA: Diagnosis not present

## 2019-09-19 DIAGNOSIS — F411 Generalized anxiety disorder: Secondary | ICD-10-CM | POA: Diagnosis not present

## 2019-09-26 DIAGNOSIS — F331 Major depressive disorder, recurrent, moderate: Secondary | ICD-10-CM | POA: Diagnosis not present

## 2019-10-17 DIAGNOSIS — F331 Major depressive disorder, recurrent, moderate: Secondary | ICD-10-CM | POA: Diagnosis not present

## 2019-11-07 DIAGNOSIS — F331 Major depressive disorder, recurrent, moderate: Secondary | ICD-10-CM | POA: Diagnosis not present

## 2019-11-14 DIAGNOSIS — F331 Major depressive disorder, recurrent, moderate: Secondary | ICD-10-CM | POA: Diagnosis not present

## 2020-01-28 ENCOUNTER — Telehealth: Payer: Self-pay | Admitting: Family Medicine

## 2020-01-28 NOTE — Telephone Encounter (Signed)
Called pt to let her know that Dr Wynetta Emery does remove them, but she does not replace them. No answer left vm.  Copied from [place] [phone]. Topic: Appointment Scheduling - Scheduling Inquiry for Clinic >> Jan 28, 2020 11:16 AM Caitlin Ortega wrote: Reason for CRM: pt would like to know if Dr Wynetta Emery removes IUD? I advised pt I did not think she does, but I woould ask.  Pt got it at health Dept in [place].

## 2020-02-03 ENCOUNTER — Encounter: Payer: Self-pay | Admitting: Family Medicine

## 2020-02-03 ENCOUNTER — Ambulatory Visit (INDEPENDENT_AMBULATORY_CARE_PROVIDER_SITE_OTHER): Payer: Medicaid Other | Admitting: Family Medicine

## 2020-02-03 ENCOUNTER — Other Ambulatory Visit: Payer: Self-pay

## 2020-02-03 VITALS — BP 121/76 | HR 120 | Temp 99.0°F | Ht 64.37 in | Wt 178.4 lb

## 2020-02-03 DIAGNOSIS — Z113 Encounter for screening for infections with a predominantly sexual mode of transmission: Secondary | ICD-10-CM | POA: Diagnosis not present

## 2020-02-03 DIAGNOSIS — Z0000 Encounter for general adult medical examination without abnormal findings: Secondary | ICD-10-CM

## 2020-02-03 DIAGNOSIS — Z30011 Encounter for initial prescription of contraceptive pills: Secondary | ICD-10-CM

## 2020-02-03 DIAGNOSIS — Z1159 Encounter for screening for other viral diseases: Secondary | ICD-10-CM

## 2020-02-03 DIAGNOSIS — Z30432 Encounter for removal of intrauterine contraceptive device: Secondary | ICD-10-CM

## 2020-02-03 LAB — URINALYSIS, ROUTINE W REFLEX MICROSCOPIC
Bilirubin, UA: NEGATIVE
Glucose, UA: NEGATIVE
Ketones, UA: NEGATIVE
Nitrite, UA: NEGATIVE
Protein,UA: NEGATIVE
Specific Gravity, UA: 1.025 (ref 1.005–1.030)
Urobilinogen, Ur: 0.2 mg/dL (ref 0.2–1.0)
pH, UA: 7 (ref 5.0–7.5)

## 2020-02-03 LAB — MICROSCOPIC EXAMINATION

## 2020-02-03 MED ORDER — NORETHIN ACE-ETH ESTRAD-FE 1-20 MG-MCG PO TABS
1.0000 | ORAL_TABLET | Freq: Every day | ORAL | 11 refills | Status: DC
Start: 2020-02-03 — End: 2020-04-07

## 2020-02-03 NOTE — Progress Notes (Signed)
BP 121/76 (BP Location: Left Arm, Patient Position: Sitting, Cuff Size: Normal)   Pulse (!) 120   Temp 99 F (37.2 C) (Oral)   Ht 5' 4.37" (1.635 m)   Wt 178 lb 6.4 oz (80.9 kg)   LMP 01/27/2020 (Exact Date)   BMI 30.27 kg/m    Subjective:    Patient ID: Caitlin Ortega, female    DOB: [birth year], 34 y.o.   MRN: [id number]  HPI: Caitlin Ortega is a 34 y.o. female presenting on 02/03/2020 for comprehensive medical examination. Current medical complaints include:  Had her IUD put in about 7 years ago. Has been having problems since she had it placed.   CONTRACEPTION CONCERNS Contraception: IUD Previous contraception: IUD, OCP  Average interval between menses: 28-30 days Length of menses: 5-7 days Flow: heavy Dysmenorrhea: yes  Menopausal Symptoms: no  Depression Screen done today and results listed below:  Depression screen [facility] 2/9 02/03/2020 01/28/2019 05/14/2018  Decreased Interest 0 0 1  Down, Depressed, Hopeless 0 0 1  PHQ - 2 Score 0 0 2  Altered sleeping - 3 0  Tired, decreased energy - 1 2  Change in appetite - 0 1  Feeling bad or failure about yourself  - 0 3  Trouble concentrating - 0 1  Moving slowly or fidgety/restless - 0 1  Suicidal thoughts - 0 0  PHQ-9 Score - 4 10  Difficult doing work/chores - Not difficult at all Not difficult at all    Past Medical History:  Past Medical History:  Diagnosis Date  . Allergy   . Asthma     Surgical History:  History reviewed. No pertinent surgical history.  Medications:  Current Outpatient Medications on File Prior to Visit  Medication Sig  . BIOTIN PO Take by mouth.  . ferrous sulfate (FERROUSUL) 325 (65 FE) MG tablet Take 1 tablet (325 mg total) by mouth daily with breakfast.  . Multiple Vitamins-Minerals (MULTIVITAMIN WITH MINERALS) tablet Take 1 tablet by mouth daily.  . Omega-3 Fatty Acids (FISH OIL) 1000 MG CAPS Take by mouth.  . IBUPROFEN PO Take by mouth daily. 600 mg (Patient not taking: Reported on  02/03/2020)   No current facility-administered medications on file prior to visit.    Allergies:  No Known Allergies  Social History:  Social History   Socioeconomic History  . Marital status: Single    Spouse name: Not on file  . Number of children: Not on file  . Years of education: Not on file  . Highest education level: Not on file  Occupational History  . Not on file  Tobacco Use  . Smoking status: Former Research scientist (life sciences)  . Smokeless tobacco: Never Used  Vaping Use  . Vaping Use: Never used  Substance and Sexual Activity  . Alcohol use: Not Currently  . Drug use: Never  . Sexual activity: Yes  Other Topics Concern  . Not on file  Social History Narrative  . Not on file   Social Determinants of Health   Financial Resource Strain:   . Difficulty of Paying Living Expenses:   Food Insecurity:   . Worried About Charity fundraiser in the Last Year:   . Arboriculturist in the Last Year:   Transportation Needs:   . Film/video editor (Medical):   Marland Kitchen Lack of Transportation (Non-Medical):   Physical Activity:   . Days of Exercise per Week:   . Minutes of Exercise per Session:   Stress:   .  Feeling of Stress :   Social Connections:   . Frequency of Communication with Friends and Family:   . Frequency of Social Gatherings with Friends and Family:   . Attends Religious Services:   . Active Member of Clubs or Organizations:   . Attends Archivist Meetings:   Marland Kitchen Marital Status:   Intimate Partner Violence:   . Fear of Current or Ex-Partner:   . Emotionally Abused:   Marland Kitchen Physically Abused:   . Sexually Abused:    Social History   Tobacco Use  Smoking Status Former Smoker  Smokeless Tobacco Never Used   Social History   Substance and Sexual Activity  Alcohol Use Not Currently    Family History:  Family History  Problem Relation Age of Onset  . Hypertension Mother   . Hypertension Sister   . Heart disease Maternal Grandfather   . Diabetes Maternal  Grandfather     Past medical history, surgical history, medications, allergies, family history and social history reviewed with patient today and changes made to appropriate areas of the chart.   Review of Systems  Constitutional: Negative.   HENT: Negative.   Eyes: Negative.   Respiratory: Negative.   Cardiovascular: Negative.   Gastrointestinal: Negative.   Genitourinary: Negative.   Musculoskeletal: Negative.   Skin: Negative.   Neurological: Negative.   Endo/Heme/Allergies: Negative.   Psychiatric/Behavioral: Negative.     All other ROS negative except what is listed above and in the HPI.      Objective:    BP 121/76 (BP Location: Left Arm, Patient Position: Sitting, Cuff Size: Normal)   Pulse (!) 120   Temp 99 F (37.2 C) (Oral)   Ht 5' 4.37" (1.635 m)   Wt 178 lb 6.4 oz (80.9 kg)   LMP 01/27/2020 (Exact Date)   BMI 30.27 kg/m   Wt Readings from Last 3 Encounters:  02/03/20 178 lb 6.4 oz (80.9 kg)  01/28/19 190 lb (86.2 kg)  05/14/18 175 lb (79.4 kg)    Physical Exam Vitals and nursing note reviewed. Exam conducted with a chaperone present.  Constitutional:      General: She is not in acute distress.    Appearance: Normal appearance. She is not ill-appearing, toxic-appearing or diaphoretic.  HENT:     Head: Normocephalic and atraumatic.     Right Ear: Tympanic membrane, ear canal and external ear normal. There is no impacted cerumen.     Left Ear: Tympanic membrane, ear canal and external ear normal. There is no impacted cerumen.     Nose: Nose normal. No congestion or rhinorrhea.     Mouth/Throat:     Mouth: Mucous membranes are moist.     Pharynx: Oropharynx is clear. No oropharyngeal exudate or posterior oropharyngeal erythema.  Eyes:     General: No scleral icterus.       Right eye: No discharge.        Left eye: No discharge.     Extraocular Movements: Extraocular movements intact.     Conjunctiva/sclera: Conjunctivae normal.     Pupils: Pupils are  equal, round, and reactive to light.  Neck:     Vascular: No carotid bruit.  Cardiovascular:     Rate and Rhythm: Normal rate and regular rhythm.     Pulses: Normal pulses.     Heart sounds: No murmur heard.  No friction rub. No gallop.   Pulmonary:     Effort: Pulmonary effort is normal. No respiratory distress.  Breath sounds: Normal breath sounds. No stridor. No wheezing, rhonchi or rales.  Chest:     Chest wall: No tenderness.  Abdominal:     General: Abdomen is flat. Bowel sounds are normal. There is no distension.     Palpations: Abdomen is soft. There is no mass.     Tenderness: There is no abdominal tenderness. There is no right CVA tenderness, left CVA tenderness, guarding or rebound.     Hernia: No hernia is present.  Genitourinary:    Labia:        Right: No rash, tenderness, lesion or injury.        Left: No rash, tenderness, lesion or injury.      Urethra: No prolapse, urethral pain, urethral swelling or urethral lesion.     Vagina: Normal.     Cervix: Normal.     Uterus: Normal.      Adnexa: Right adnexa normal and left adnexa normal.     Rectum: Normal.     Comments: Breast exams deferred with shared decision making Musculoskeletal:        General: No swelling, tenderness, deformity or signs of injury.     Cervical back: Normal range of motion and neck supple. No rigidity. No muscular tenderness.     Right lower leg: No edema.     Left lower leg: No edema.  Lymphadenopathy:     Cervical: No cervical adenopathy.  Skin:    General: Skin is warm and dry.     Capillary Refill: Capillary refill takes less than 2 seconds.     Coloration: Skin is not jaundiced or pale.     Findings: No bruising, erythema, lesion or rash.  Neurological:     General: No focal deficit present.     Mental Status: She is alert and oriented to person, place, and time. Mental status is at baseline.     Cranial Nerves: No cranial nerve deficit.     Sensory: No sensory deficit.      Motor: No weakness.     Coordination: Coordination normal.     Gait: Gait normal.     Deep Tendon Reflexes: Reflexes normal.  Psychiatric:        Mood and Affect: Mood normal.        Behavior: Behavior normal.        Thought Content: Thought content normal.        Judgment: Judgment normal.     Results for orders placed or performed in visit on 01/28/19  CBC with Differential/Platelet  Result Value Ref Range   WBC 6.9 3.4 - 10.8 x10E3/uL   RBC 4.56 3.77 - 5.28 x10E6/uL   Hemoglobin 10.8 (L) 11.1 - 15.9 g/dL   Hematocrit 34.6 34.0 - 46.6 %   MCV 76 (L) 79 - 97 fL   MCH 23.7 (L) 26.6 - 33.0 pg   MCHC 31.2 (L) 31 - 35 g/dL   RDW 15.6 (H) 11.7 - 15.4 %   Platelets 334 150 - 450 x10E3/uL   Neutrophils 51 Not Estab. %   Lymphs 39 Not Estab. %   Monocytes 6 Not Estab. %   Eos 3 Not Estab. %   Basos 1 Not Estab. %   Neutrophils Absolute 3.5 1 - 7 x10E3/uL   Lymphocytes Absolute 2.7 0 - 3 x10E3/uL   Monocytes Absolute 0.4 0 - 0 x10E3/uL   EOS (ABSOLUTE) 0.2 0.0 - 0.4 x10E3/uL   Basophils Absolute 0.1 0 - 0 x10E3/uL   Immature  Granulocytes 0 Not Estab. %   Immature Grans (Abs) 0.0 0.0 - 0.1 x10E3/uL  Comprehensive metabolic panel  Result Value Ref Range   Glucose 80 65 - 99 mg/dL   BUN 13 6 - 20 mg/dL   Creatinine, Ser 0.65 0.57 - 1.00 mg/dL   GFR calc non Af Amer 120 >59 mL/min/1.73   GFR calc Af Amer 138 >59 mL/min/1.73   BUN/Creatinine Ratio 20 9 - 23   Sodium 137 134 - 144 mmol/L   Potassium 4.0 3.5 - 5.2 mmol/L   Chloride 103 96 - 106 mmol/L   CO2 20 20 - 29 mmol/L   Calcium 10.0 8.7 - 10.2 mg/dL   Total Protein 7.5 6.0 - 8.5 g/dL   Albumin 4.7 3.9 - 5.0 g/dL   Globulin, Total 2.8 1.5 - 4.5 g/dL   Albumin/Globulin Ratio 1.7 1.2 - 2.2   Bilirubin Total 0.4 0.0 - 1.2 mg/dL   Alkaline Phosphatase 76 39 - 117 IU/L   AST 31 0 - 40 IU/L   ALT 37 (H) 0 - 32 IU/L  Lipid Panel w/o Chol/HDL Ratio  Result Value Ref Range   Cholesterol, Total 173 100 - 199 mg/dL    Triglycerides 80 0 - 149 mg/dL   HDL 47 >39 mg/dL   VLDL Cholesterol Cal 16 5 - 40 mg/dL   LDL Calculated 110 (H) 0 - 99 mg/dL  TSH  Result Value Ref Range   TSH 0.926 0.450 - 4.500 uIU/mL  HIV Antibody (routine testing w rflx)  Result Value Ref Range   HIV Screen 4th Generation wRfx Non Reactive Non Reactive  UA/M w/rflx Culture, Routine   Specimen: Urine   URINE  Result Value Ref Range   Specific Gravity, UA 1.025 1.005 - 1.030   pH, UA 5.0 5.0 - 7.5   Color, UA Yellow Yellow   Appearance Ur Clear Clear   Leukocytes,UA Negative Negative   Protein,UA Negative Negative/Trace   Glucose, UA Negative Negative   Ketones, UA Negative Negative   RBC, UA Negative Negative   Bilirubin, UA Negative Negative   Urobilinogen, Ur 0.2 0.2 - 1.0 mg/dL   Nitrite, UA Negative Negative  Cytology - PAP  Result Value Ref Range   Adequacy      Satisfactory for evaluation  endocervical/transformation zone component ABSENT.   Diagnosis      NEGATIVE FOR INTRAEPITHELIAL LESIONS OR MALIGNANCY.   HPV NOT DETECTED    Material Submitted CervicoVaginal Pap [ThinPrep Imaged]    CYTOLOGY - PAP PAP RESULT       Assessment & Plan:   Problem List Items Addressed This Visit    None    Visit Diagnoses    Routine general medical examination at a health care facility    -  Primary   Vaccines up to date/declined. Screening labs checked today. Pap up to date. Continue diet and exercise. Call with any concerns.    Relevant Orders   CBC with Differential/Platelet   Comprehensive metabolic panel   Lipid Panel w/o Chol/HDL Ratio   TSH   Urinalysis, Routine w reflex microscopic   Need for hepatitis C screening test       Labs drawn today. Await results.    Relevant Orders   Hepatitis C antibody   Encounter for removal of intrauterine contraceptive device (IUD)       Removed today with no complications.    Encounter for initial prescription of contraceptive pills       Will start  OCP. Recheck tolerance 1  month. Call with any concerns.    Routine screening for STI (sexually transmitted infection)       Labs drawn today. Await results.    Relevant Orders   HIV Antibody (routine testing w rflx)   GC/Chlamydia Probe Amp   RPR   HSV(herpes simplex vrs) 1+2 ab-IgG       Follow up plan: Return in about 4 weeks (around 03/02/2020) for follow up OCP.   LABORATORY TESTING:  - Pap smear: pap done  IMMUNIZATIONS:   - Tdap: Tetanus vaccination status reviewed: last tetanus booster within 10 years. - Influenza: Postponed to flu season - Pneumovax: Not applicable  PATIENT COUNSELING:   Advised to take 1 mg of folate supplement per day if capable of pregnancy.   Sexuality: Discussed sexually transmitted diseases, partner selection, use of condoms, avoidance of unintended pregnancy  and contraceptive alternatives.   Advised to avoid cigarette smoking.  I discussed with the patient that most people either abstain from alcohol or drink within safe limits (<=14/week and <=4 drinks/occasion for males, <=7/weeks and <= 3 drinks/occasion for females) and that the risk for alcohol disorders and other health effects rises proportionally with the number of drinks per week and how often a drinker exceeds daily limits.  Discussed cessation/primary prevention of drug use and availability of treatment for abuse.   Diet: Encouraged to adjust caloric intake to maintain  or achieve ideal body weight, to reduce intake of dietary saturated fat and total fat, to limit sodium intake by avoiding high sodium foods and not adding table salt, and to maintain adequate dietary potassium and calcium preferably from fresh fruits, vegetables, and low-fat dairy products.    stressed the importance of regular exercise  Injury prevention: Discussed safety belts, safety helmets, smoke detector, smoking near bedding or upholstery.   Dental health: Discussed importance of regular tooth brushing, flossing, and dental visits.     NEXT PREVENTATIVE PHYSICAL DUE IN 1 YEAR. Return in about 4 weeks (around 03/02/2020) for follow up OCP.

## 2020-02-03 NOTE — Patient Instructions (Signed)
Health Maintenance, Female Adopting a healthy lifestyle and getting preventive care are important in promoting health and wellness. Ask your health care provider about:  The right schedule for you to have regular tests and exams.  Things you can do on your own to prevent diseases and keep yourself healthy. What should I know about diet, weight, and exercise? Eat a healthy diet   Eat a diet that includes plenty of vegetables, fruits, low-fat dairy products, and lean protein.  Do not eat a lot of foods that are high in solid fats, added sugars, or sodium. Maintain a healthy weight Body mass index (BMI) is used to identify weight problems. It estimates body fat based on height and weight. Your health care provider can help determine your BMI and help you achieve or maintain a healthy weight. Get regular exercise Get regular exercise. This is one of the most important things you can do for your health. Most adults should:  Exercise for at least 150 minutes each week. The exercise should increase your heart rate and make you sweat (moderate-intensity exercise).  Do strengthening exercises at least twice a week. This is in addition to the moderate-intensity exercise.  Spend less time sitting. Even light physical activity can be beneficial. Watch cholesterol and blood lipids Have your blood tested for lipids and cholesterol at 32 years of age, then have this test every 5 years. Have your cholesterol levels checked more often if:  Your lipid or cholesterol levels are high.  You are older than 32 years of age.  You are at high risk for heart disease. What should I know about cancer screening? Depending on your health history and family history, you may need to have cancer screening at various ages. This may include screening for:  Breast cancer.  Cervical cancer.  Colorectal cancer.  Skin cancer.  Lung cancer. What should I know about heart disease, diabetes, and high blood  pressure? Blood pressure and heart disease  High blood pressure causes heart disease and increases the risk of stroke. This is more likely to develop in people who have high blood pressure readings, are of African descent, or are overweight.  Have your blood pressure checked: ? Every 3-5 years if you are 18-39 years of age. ? Every year if you are 40 years old or older. Diabetes Have regular diabetes screenings. This checks your fasting blood sugar level. Have the screening done:  Once every three years after age 40 if you are at a normal weight and have a low risk for diabetes.  More often and at a younger age if you are overweight or have a high risk for diabetes. What should I know about preventing infection? Hepatitis B If you have a higher risk for hepatitis B, you should be screened for this virus. Talk with your health care provider to find out if you are at risk for hepatitis B infection. Hepatitis C Testing is recommended for:  Everyone born from 1945 through 1965.  Anyone with known risk factors for hepatitis C. Sexually transmitted infections (STIs)  Get screened for STIs, including gonorrhea and chlamydia, if: ? You are sexually active and are younger than 32 years of age. ? You are older than 32 years of age and your health care provider tells you that you are at risk for this type of infection. ? Your sexual activity has changed since you were last screened, and you are at increased risk for chlamydia or gonorrhea. Ask your health care provider if   you are at risk.  Ask your health care provider about whether you are at high risk for HIV. Your health care provider may recommend a prescription medicine to help prevent HIV infection. If you choose to take medicine to prevent HIV, you should first get tested for HIV. You should then be tested every 3 months for as long as you are taking the medicine. Pregnancy  If you are about to stop having your period (premenopausal) and  you may become pregnant, seek counseling before you get pregnant.  Take 400 to 800 micrograms (mcg) of folic acid every day if you become pregnant.  Ask for birth control (contraception) if you want to prevent pregnancy. Osteoporosis and menopause Osteoporosis is a disease in which the bones lose minerals and strength with aging. This can result in bone fractures. If you are 65 years old or older, or if you are at risk for osteoporosis and fractures, ask your health care provider if you should:  Be screened for bone loss.  Take a calcium or vitamin D supplement to lower your risk of fractures.  Be given hormone replacement therapy (HRT) to treat symptoms of menopause. Follow these instructions at home: Lifestyle  Do not use any products that contain nicotine or tobacco, such as cigarettes, e-cigarettes, and chewing tobacco. If you need help quitting, ask your health care provider.  Do not use street drugs.  Do not share needles.  Ask your health care provider for help if you need support or information about quitting drugs. Alcohol use  Do not drink alcohol if: ? Your health care provider tells you not to drink. ? You are pregnant, may be pregnant, or are planning to become pregnant.  If you drink alcohol: ? Limit how much you use to 0-1 drink a day. ? Limit intake if you are breastfeeding.  Be aware of how much alcohol is in your drink. In the [place] one drink equals one 12 oz bottle of beer (355 mL), one 5 oz glass of wine (148 mL), or one 1 oz glass of hard liquor (44 mL). General instructions  Schedule regular health, dental, and eye exams.  Stay current with your vaccines.  Tell your health care provider if: ? You often feel depressed. ? You have ever been abused or do not feel safe at home. Summary  Adopting a healthy lifestyle and getting preventive care are important in promoting health and wellness.  Follow your health care provider's instructions about healthy  diet, exercising, and getting tested or screened for diseases.  Follow your health care provider's instructions on monitoring your cholesterol and blood pressure. This information is not intended to replace advice given to you by your health care provider. Make sure you discuss any questions you have with your health care provider. Document Revised: 07/18/2018 Document Reviewed: 07/18/2018 [employer]vier Patient Education  2020 [employer]  

## 2020-02-04 LAB — COMPREHENSIVE METABOLIC PANEL
ALT: 21 IU/L (ref 0–32)
AST: 24 IU/L (ref 0–40)
Albumin/Globulin Ratio: 1.4 (ref 1.2–2.2)
Albumin: 4.4 g/dL (ref 3.8–4.8)
Alkaline Phosphatase: 117 IU/L (ref 48–121)
BUN/Creatinine Ratio: 17 (ref 9–23)
BUN: 12 mg/dL (ref 6–20)
Bilirubin Total: 0.2 mg/dL (ref 0.0–1.2)
CO2: 23 mmol/L (ref 20–29)
Calcium: 9.7 mg/dL (ref 8.7–10.2)
Chloride: 106 mmol/L (ref 96–106)
Creatinine, Ser: 0.72 mg/dL (ref 0.57–1.00)
GFR calc Af Amer: 129 mL/min/{1.73_m2} (ref >59)
GFR calc non Af Amer: 112 mL/min/{1.73_m2} (ref >59)
Globulin, Total: 3.1 g/dL (ref 1.5–4.5)
Glucose: 86 mg/dL (ref 65–99)
Potassium: 4.1 mmol/L (ref 3.5–5.2)
Sodium: 138 mmol/L (ref 134–144)
Total Protein: 7.5 g/dL (ref 6.0–8.5)

## 2020-02-04 LAB — HSV(HERPES SIMPLEX VRS) I + II AB-IGG
HSV 1 Glycoprotein G Ab, IgG: 43.5 index — ABNORMAL HIGH (ref 0.00–0.90)
HSV 2 IgG, Type Spec: <0.91 index (ref 0.00–0.90)

## 2020-02-04 LAB — CBC WITH DIFFERENTIAL/PLATELET
Basophils Absolute: 0.1 10*3/uL (ref 0.0–0.2)
Basos: 1 %
EOS (ABSOLUTE): 0.2 10*3/uL (ref 0.0–0.4)
Eos: 4 %
Hematocrit: 35.1 % (ref 34.0–46.6)
Hemoglobin: 11 g/dL — ABNORMAL LOW (ref 11.1–15.9)
Immature Grans (Abs): 0 10*3/uL (ref 0.0–0.1)
Immature Granulocytes: 0 %
Lymphocytes Absolute: 2.1 10*3/uL (ref 0.7–3.1)
Lymphs: 34 %
MCH: 24.8 pg — ABNORMAL LOW (ref 26.6–33.0)
MCHC: 31.3 g/dL — ABNORMAL LOW (ref 31.5–35.7)
MCV: 79 fL (ref 79–97)
Monocytes Absolute: 0.4 10*3/uL (ref 0.1–0.9)
Monocytes: 6 %
Neutrophils Absolute: 3.3 10*3/uL (ref 1.4–7.0)
Neutrophils: 55 %
Platelets: 324 10*3/uL (ref 150–450)
RBC: 4.44 x10E6/uL (ref 3.77–5.28)
RDW: 15 % (ref 11.7–15.4)
WBC: 6.1 10*3/uL (ref 3.4–10.8)

## 2020-02-04 LAB — GC/CHLAMYDIA PROBE AMP
Chlamydia trachomatis, NAA: NEGATIVE
Neisseria Gonorrhoeae by PCR: NEGATIVE

## 2020-02-04 LAB — TSH: TSH: 0.511 u[IU]/mL (ref 0.450–4.500)

## 2020-02-04 LAB — HEPATITIS C ANTIBODY: Hep C Virus Ab: <0.1 s/co ratio (ref 0.0–0.9)

## 2020-02-04 LAB — LIPID PANEL W/O CHOL/HDL RATIO
Cholesterol, Total: 157 mg/dL (ref 100–199)
HDL: 36 mg/dL — ABNORMAL LOW (ref >39)
LDL Chol Calc (NIH): 85 mg/dL (ref 0–99)
Triglycerides: 211 mg/dL — ABNORMAL HIGH (ref 0–149)
VLDL Cholesterol Cal: 36 mg/dL (ref 5–40)

## 2020-02-04 LAB — RPR: RPR Ser Ql: NONREACTIVE

## 2020-02-04 LAB — HIV ANTIBODY (ROUTINE TESTING W REFLEX): HIV Screen 4th Generation wRfx: NONREACTIVE

## 2020-03-02 ENCOUNTER — Ambulatory Visit: Payer: Medicaid Other | Admitting: Family Medicine

## 2020-03-23 ENCOUNTER — Ambulatory Visit: Payer: Medicaid Other | Admitting: Family Medicine

## 2020-03-23 ENCOUNTER — Other Ambulatory Visit: Payer: Self-pay

## 2020-03-23 ENCOUNTER — Encounter: Payer: Self-pay | Admitting: Family Medicine

## 2020-03-23 VITALS — BP 121/79 | HR 68 | Temp 99.2°F | Wt 177.0 lb

## 2020-03-23 DIAGNOSIS — Z3041 Encounter for surveillance of contraceptive pills: Secondary | ICD-10-CM | POA: Diagnosis not present

## 2020-03-23 NOTE — Progress Notes (Signed)
BP 121/79 (BP Location: Left Arm, Patient Position: Sitting, Cuff Size: Normal)   Pulse 68   Temp 99.2 F (37.3 C) (Oral)   Wt 177 lb (80.3 kg)   LMP 02/22/2020   SpO2 100%   BMI 30.03 kg/m    Subjective:    Patient ID: Caitlin Ortega, female    DOB: 10/10/1985, 34 y.o.   MRN: [id number]  HPI: Caitlin Ortega is a 34 y.o. female  Chief Complaint  Patient presents with  . Contraception   CONTRACEPTION CONCERNS- back on her OCP for about a week Contraception: OCP Previous contraception: IUD  Sexual activity: careful partner selection Average interval between menses: 28 days  Flow: moderate Dysmenorrhea: no  Relevant past medical, surgical, family and social history reviewed and updated as indicated. Interim medical history since our last visit reviewed. Allergies and medications reviewed and updated.  Review of Systems  Constitutional: Negative.   Respiratory: Negative.   Cardiovascular: Negative.   Gastrointestinal: Negative.   Musculoskeletal: Negative.   Psychiatric/Behavioral: Negative.     Per HPI unless specifically indicated above     Objective:    BP 121/79 (BP Location: Left Arm, Patient Position: Sitting, Cuff Size: Normal)   Pulse 68   Temp 99.2 F (37.3 C) (Oral)   Wt 177 lb (80.3 kg)   LMP 02/22/2020   SpO2 100%   BMI 30.03 kg/m   Wt Readings from Last 3 Encounters:  03/23/20 177 lb (80.3 kg)  02/03/20 178 lb 6.4 oz (80.9 kg)  01/28/19 190 lb (86.2 kg)    Physical Exam Vitals and nursing note reviewed.  Constitutional:      General: She is not in acute distress.    Appearance: Normal appearance. She is not ill-appearing, toxic-appearing or diaphoretic.  HENT:     Head: Normocephalic and atraumatic.     Right Ear: External ear normal.     Left Ear: External ear normal.     Nose: Nose normal.     Mouth/Throat:     Mouth: Mucous membranes are moist.     Pharynx: Oropharynx is clear.  Eyes:     General: No scleral icterus.        Right eye: No discharge.        Left eye: No discharge.     Extraocular Movements: Extraocular movements intact.     Conjunctiva/sclera: Conjunctivae normal.     Pupils: Pupils are equal, round, and reactive to light.  Cardiovascular:     Rate and Rhythm: Normal rate and regular rhythm.     Pulses: Normal pulses.     Heart sounds: Normal heart sounds. No murmur heard.  No friction rub. No gallop.   Pulmonary:     Effort: Pulmonary effort is normal. No respiratory distress.     Breath sounds: Normal breath sounds. No stridor. No wheezing, rhonchi or rales.  Chest:     Chest wall: No tenderness.  Musculoskeletal:        General: Normal range of motion.     Cervical back: Normal range of motion and neck supple.  Skin:    General: Skin is warm and dry.     Capillary Refill: Capillary refill takes less than 2 seconds.     Coloration: Skin is not jaundiced or pale.     Findings: No bruising, erythema, lesion or rash.  Neurological:     General: No focal deficit present.     Mental Status: She is alert and oriented to person,  place, and time. Mental status is at baseline.  Psychiatric:        Mood and Affect: Mood normal.        Behavior: Behavior normal.        Thought Content: Thought content normal.        Judgment: Judgment normal.     Results for orders placed or performed in visit on 02/03/20  GC/Chlamydia Probe Amp   Specimen: Urine   UR  Result Value Ref Range   Chlamydia trachomatis, NAA Negative Negative   Neisseria Gonorrhoeae by PCR Negative Negative  Microscopic Examination   Urine  Result Value Ref Range   WBC, UA 0-5 0 - 5 /hpf   RBC 11-30 (A) 0 - 2 /hpf   Epithelial Cells (non renal) 0-10 0 - 10 /hpf   Bacteria, UA Few (A) None seen/Few  Hepatitis C antibody  Result Value Ref Range   Hep C Virus Ab <0.1 0.0 - 0.9 s/co ratio  CBC with Differential/Platelet  Result Value Ref Range   WBC 6.1 3.4 - 10.8 x10E3/uL   RBC 4.44 3.77 - 5.28 x10E6/uL   Hemoglobin  11.0 (L) 11.1 - 15.9 g/dL   Hematocrit 35.1 34.0 - 46.6 %   MCV 79 79 - 97 fL   MCH 24.8 (L) 26.6 - 33.0 pg   MCHC 31.3 (L) 31 - 35 g/dL   RDW 15.0 11.7 - 15.4 %   Platelets 324 150 - 450 x10E3/uL   Neutrophils 55 Not Estab. %   Lymphs 34 Not Estab. %   Monocytes 6 Not Estab. %   Eos 4 Not Estab. %   Basos 1 Not Estab. %   Neutrophils Absolute 3.3 1 - 7 x10E3/uL   Lymphocytes Absolute 2.1 0 - 3 x10E3/uL   Monocytes Absolute 0.4 0 - 0 x10E3/uL   EOS (ABSOLUTE) 0.2 0.0 - 0.4 x10E3/uL   Basophils Absolute 0.1 0 - 0 x10E3/uL   Immature Granulocytes 0 Not Estab. %   Immature Grans (Abs) 0.0 0.0 - 0.1 x10E3/uL  Comprehensive metabolic panel  Result Value Ref Range   Glucose 86 65 - 99 mg/dL   BUN 12 6 - 20 mg/dL   Creatinine, Ser 0.72 0.57 - 1.00 mg/dL   GFR calc non Af Amer 112 >59 mL/min/1.73   GFR calc Af Amer 129 >59 mL/min/1.73   BUN/Creatinine Ratio 17 9 - 23   Sodium 138 134 - 144 mmol/L   Potassium 4.1 3.5 - 5.2 mmol/L   Chloride 106 96 - 106 mmol/L   CO2 23 20 - 29 mmol/L   Calcium 9.7 8.7 - 10.2 mg/dL   Total Protein 7.5 6.0 - 8.5 g/dL   Albumin 4.4 3.8 - 4.8 g/dL   Globulin, Total 3.1 1.5 - 4.5 g/dL   Albumin/Globulin Ratio 1.4 1.2 - 2.2   Bilirubin Total 0.2 0.0 - 1.2 mg/dL   Alkaline Phosphatase 117 48 - 121 IU/L   AST 24 0 - 40 IU/L   ALT 21 0 - 32 IU/L  Lipid Panel w/o Chol/HDL Ratio  Result Value Ref Range   Cholesterol, Total 157 100 - 199 mg/dL   Triglycerides 211 (H) 0 - 149 mg/dL   HDL 36 (L) >39 mg/dL   VLDL Cholesterol Cal 36 5 - 40 mg/dL   LDL Chol Calc (NIH) 85 0 - 99 mg/dL  TSH  Result Value Ref Range   TSH 0.511 0.450 - 4.500 uIU/mL  Urinalysis, Routine w reflex microscopic  Result  Value Ref Range   Specific Gravity, UA 1.025 1.005 - 1.030   pH, UA 7.0 5.0 - 7.5   Color, UA Yellow Yellow   Appearance Ur Cloudy (A) Clear   Leukocytes,UA 1+ (A) Negative   Protein,UA Negative Negative/Trace   Glucose, UA Negative Negative   Ketones, UA  Negative Negative   RBC, UA 3+ (A) Negative   Bilirubin, UA Negative Negative   Urobilinogen, Ur 0.2 0.2 - 1.0 mg/dL   Nitrite, UA Negative Negative   Microscopic Examination See below:   HIV Antibody (routine testing w rflx)  Result Value Ref Range   HIV Screen 4th Generation wRfx Non Reactive Non Reactive  RPR  Result Value Ref Range   RPR Ser Ql Non Reactive Non Reactive  HSV(herpes simplex vrs) 1+2 ab-IgG  Result Value Ref Range   HSV 1 Glycoprotein G Ab, IgG 43.50 (H) 0.00 - 0.90 index   HSV 2 IgG, Type Spec <0.91 0.00 - 0.90 index      Assessment & Plan:   Problem List Items Addressed This Visit    None    Visit Diagnoses    Encounter for surveillance of contraceptive pills    -  Primary   Tolerating medicine well. Feeling well. No concerns.        Follow up plan: Return if symptoms worsen or fail to improve.

## 2020-04-07 ENCOUNTER — Other Ambulatory Visit: Payer: Self-pay

## 2020-04-07 ENCOUNTER — Encounter: Payer: Self-pay | Admitting: Family Medicine

## 2020-04-07 ENCOUNTER — Ambulatory Visit: Payer: Medicaid Other | Admitting: Family Medicine

## 2020-04-07 VITALS — BP 102/66 | HR 61 | Temp 98.9°F | Wt 179.0 lb

## 2020-04-07 DIAGNOSIS — Z3A01 Less than 8 weeks gestation of pregnancy: Secondary | ICD-10-CM

## 2020-04-07 DIAGNOSIS — N912 Amenorrhea, unspecified: Secondary | ICD-10-CM | POA: Diagnosis not present

## 2020-04-07 LAB — PREGNANCY, URINE: Preg Test, Ur: POSITIVE — AB

## 2020-04-07 MED ORDER — PRENATAL + COMPLETE MULTI 0.267 & 373 MG PO THPK: 1.0000 | PACK | Freq: Every day | ORAL | 3 refills | Status: DC

## 2020-04-07 NOTE — Patient Instructions (Signed)
Prenatal Care Prenatal care is health care during pregnancy. It helps you and your unborn baby (fetus) stay as healthy as possible. Prenatal care may be provided by a midwife, a family practice health care provider, or a childbirth and pregnancy specialist (obstetrician). How does this affect me? During pregnancy, you will be closely monitored for any new conditions that might develop. To lower your risk of pregnancy complications, you and your health care provider will talk about any underlying conditions you have. How does this affect my baby? Early and consistent prenatal care increases the chance that your baby will be healthy during pregnancy. Prenatal care lowers the risk that your baby will be:  Born early (prematurely).  Smaller than expected at birth (small for gestational age). What can I expect at the first prenatal care visit? Your first prenatal care visit will likely be the longest. You should schedule your first prenatal care visit as soon as you know that you are pregnant. Your first visit is a good time to talk about any questions or concerns you have about pregnancy. At your visit, you and your health care provider will talk about:  Your medical history, including: ? Any past pregnancies. ? Your family's medical history. ? The baby's father's medical history. ? Any long-term (chronic) health conditions you have and how you manage them. ? Any surgeries or procedures you have had. ? Any current over-the-counter or prescription medicines, herbs, or supplements you are taking.  Other factors that could pose a risk to your baby, including:  Your home setting and your stress levels, including: ? Exposure to abuse or violence. ? Household financial strain. ? Mental health conditions you have.  Your daily health habits, including diet and exercise. Your health care provider will also:  Measure your weight, height, and blood pressure.  Do a physical exam, including a pelvic  and breast exam.  Perform blood tests and urine tests to check for: ? Urinary tract infection. ? Sexually transmitted infections (STIs). ? Low iron levels in your blood (anemia). ? Blood type and certain proteins on red blood cells (Rh antibodies). ? Infections and immunity to viruses, such as hepatitis B and rubella. ? HIV (human immunodeficiency virus).  Do an ultrasound to confirm your baby's growth and development and to help predict your estimated due date (EDD). This ultrasound is done with a probe that is inserted into the vagina (transvaginal ultrasound).  Discuss your options for genetic screening.  Give you information about how to keep yourself and your baby healthy, including: ? Nutrition and taking vitamins. ? Physical activity. ? How to manage pregnancy symptoms such as nausea and vomiting (morning sickness). ? Infections and substances that may be harmful to your baby and how to avoid them. ? Food safety. ? Dental care. ? Working. ? Travel. ? Warning signs to watch for and when to call your health care provider. How often will I have prenatal care visits? After your first prenatal care visit, you will have regular visits throughout your pregnancy. The visit schedule is often as follows:  Up to week 28 of pregnancy: once every 4 weeks.  28-36 weeks: once every 2 weeks.  After 36 weeks: every week until delivery. Some women may have visits more or less often depending on any underlying health conditions and the health of the baby. Keep all follow-up and prenatal care visits as told by your health care provider. This is important. What happens during routine prenatal care visits? Your health care provider will:    Measure your weight and blood pressure.  Check for fetal heart sounds.  Measure the height of your uterus in your abdomen (fundal height). This may be measured starting around week 20 of pregnancy.  Check the position of your baby inside your  uterus.  Ask questions about your diet, sleeping patterns, and whether you can feel the baby move.  Review warning signs to watch for and signs of labor.  Ask about any pregnancy symptoms you are having and how you are dealing with them. Symptoms may include: ? Headaches. ? Nausea and vomiting. ? Vaginal discharge. ? Swelling. ? Fatigue. ? Constipation. ? Any discomfort, including back or pelvic pain. Make a list of questions to ask your health care provider at your routine visits. What tests might I have during prenatal care visits? You may have blood, urine, and imaging tests throughout your pregnancy, such as:  Urine tests to check for glucose, protein, or signs of infection.  Glucose tests to check for a form of diabetes that can develop during pregnancy (gestational diabetes mellitus). This is usually done around week 24 of pregnancy.  An ultrasound to check your baby's growth and development and to check for birth defects. This is usually done around week 20 of pregnancy.  A test to check for group B strep (GBS) infection. This is usually done around week 36 of pregnancy.  Genetic testing. This may include blood or imaging tests, such as an ultrasound. Some genetic tests are done during the first trimester and some are done during the second trimester. What else can I expect during prenatal care visits? Your health care provider may recommend getting certain vaccines during pregnancy. These may include:  A yearly flu shot (annual influenza vaccine). This is especially important if you will be pregnant during flu season.  Tdap (tetanus, diphtheria, pertussis) vaccine. Getting this vaccine during pregnancy can protect your baby from whooping cough (pertussis) after birth. This vaccine may be recommended between weeks 27 and 36 of pregnancy. Later in your pregnancy, your health care provider may give you information about:  Childbirth and breastfeeding classes.  Choosing a  health care provider for your baby.  Umbilical cord banking.  Breastfeeding.  Birth control after your baby is born.  The hospital labor and delivery unit and how to tour it.  Registering at the hospital before you go into labor. Where to find more information  Office on Women's Health: LegalWarrants.gl  American Pregnancy Association: americanpregnancy.org  March of Dimes: marchofdimes.org Summary  Prenatal care helps you and your baby stay as healthy as possible during pregnancy.  Your first prenatal care visit will most likely be the longest.  You will have visits and tests throughout your pregnancy to monitor your health and your baby's health.  Bring a list of questions to your visits to ask your health care provider.  Make sure to keep all follow-up and prenatal care visits with your health care provider. This information is not intended to replace advice given to you by your health care provider. Make sure you discuss any questions you have with your health care provider. Document Revised: 11/14/2018 Document Reviewed: 07/24/2017 Elsevier Patient Education  [place].  Morning Sickness  Morning sickness is when a woman feels nauseous during pregnancy. This nauseous feeling may or may not come with vomiting. It often occurs in the morning, but it can be a problem at any time of day. Morning sickness is most common during the first trimester. In some cases, it  may continue throughout pregnancy. Although morning sickness is unpleasant, it is usually harmless unless the woman develops severe and continual vomiting (hyperemesis gravidarum), a condition that requires more intense treatment. What are the causes? The exact cause of this condition is not known, but it seems to be related to normal hormonal changes that occur in pregnancy. What increases the risk? You are more likely to develop this condition if:  You experienced nausea or vomiting before your  pregnancy.  You had morning sickness during a previous pregnancy.  You are pregnant with more than one baby, such as twins. What are the signs or symptoms? Symptoms of this condition include:  Nausea.  Vomiting. How is this diagnosed? This condition is usually diagnosed based on your signs and symptoms. How is this treated? In many cases, treatment is not needed for this condition. Making some changes to what you eat may help to control symptoms. Your health care provider may also prescribe or recommend:  Vitamin B6 supplements.  Anti-nausea medicines.  Ginger. Follow these instructions at home: Medicines  Take over-the-counter and prescription medicines only as told by your health care provider. Do not use any prescription, over-the-counter, or herbal medicines for morning sickness without first talking with your health care provider.  Taking multivitamins before getting pregnant can prevent or decrease the severity of morning sickness in most women. Eating and drinking  Eat a piece of dry toast or crackers before getting out of bed in the morning.  Eat 5 or 6 small meals a day.  Eat dry and bland foods, such as rice or a baked potato. Foods that are high in carbohydrates are often helpful.  Avoid greasy, fatty, and spicy foods.  Have someone cook for you if the smell of any food causes nausea and vomiting.  If you feel nauseous after taking prenatal vitamins, take the vitamins at night or with a snack.  Snack on protein foods between meals if you are hungry. Nuts, yogurt, and cheese are good options.  Drink fluids throughout the day.  Try ginger ale made with real ginger, ginger tea made from fresh grated ginger, or ginger candies. General instructions  Do not use any products that contain nicotine or tobacco, such as cigarettes and e-cigarettes. If you need help quitting, ask your health care provider.  Get an air purifier to keep the air in your house free of  odors.  Get plenty of fresh air.  Try to avoid odors that trigger your nausea.  Consider trying these methods to help relieve symptoms: ? Wearing an acupressure wristband. These wristbands are often worn for seasickness. ? Acupuncture. Contact a health care provider if:  Your home remedies are not working and you need medicine.  You feel dizzy or light-headed.  You are losing weight. Get help right away if:  You have persistent and uncontrolled nausea and vomiting.  You faint.  You have severe pain in your abdomen. Summary  Morning sickness is when a woman feels nauseous during pregnancy. This nauseous feeling may or may not come with vomiting.  Morning sickness is most common during the first trimester.  It often occurs in the morning, but it can be a problem at any time of day.  In many cases, treatment is not needed for this condition. Making some changes to what you eat may help to control symptoms. This information is not intended to replace advice given to you by your health care provider. Make sure you discuss any questions you have with  your health care provider. Document Revised: 07/07/2017 Document Reviewed: 08/27/2016 Elsevier Patient Education  2020 [employer].  How a Baby Grows During Pregnancy  Pregnancy begins when a female's sperm enters a female's egg (fertilization). Fertilization usually happens in one of the tubes (fallopian tubes) that connect the ovaries to the womb (uterus). The fertilized egg moves down the fallopian tube to the uterus. Once it reaches the uterus, it implants into the lining of the uterus and begins to grow. For the first 10 weeks, the fertilized egg is called an embryo. After 10 weeks, it is called a fetus. As the fetus continues to grow, it receives oxygen and nutrients through tissue (placenta) that grows to support the developing baby. The placenta is the life support system for the baby. It provides oxygen and nutrition and removes  waste. Learning as much as you can about your pregnancy and how your baby is developing can help you enjoy the experience. It can also make you aware of when there might be a problem and when to ask questions. How long does a typical pregnancy last? A pregnancy usually lasts 280 days, or about 40 weeks. Pregnancy is divided into three periods of growth, also called trimesters:  First trimester: 0-12 weeks.  Second trimester: 13-27 weeks.  Third trimester: 28-40 weeks. The day when your baby is ready to be born (full term) is your estimated date of delivery. How does my baby develop month by month? First month  The fertilized egg attaches to the inside of the uterus.  Some cells will form the placenta. Others will form the fetus.  The arms, legs, brain, spinal cord, lungs, and heart begin to develop.  At the end of the first month, the heart begins to beat. Second month  The bones, inner ear, eyelids, hands, and feet form.  The genitals develop.  By the end of 8 weeks, all major organs are developing. Third month  All of the internal organs are forming.  Teeth develop below the gums.  Bones and muscles begin to grow. The spine can flex.  The skin is transparent.  Fingernails and toenails begin to form.  Arms and legs continue to grow longer, and hands and feet develop.  The fetus is about 3 inches (7.6 cm) long. Fourth month  The placenta is completely formed.  The external sex organs, neck, outer ear, eyebrows, eyelids, and fingernails are formed.  The fetus can hear, swallow, and move its arms and legs.  The kidneys begin to produce urine.  The skin is covered with a white, waxy coating (vernix) and very fine hair (lanugo). Fifth month  The fetus moves around more and can be felt for the first time (quickening).  The fetus starts to sleep and wake up and may begin to suck its finger.  The nails grow to the end of the fingers.  The organ in the digestive  system that makes bile (gallbladder) functions and helps to digest nutrients.  If your baby is a girl, eggs are present in her ovaries. If your baby is a boy, testicles start to move down into his scrotum. Sixth month  The lungs are formed.  The eyes open. The brain continues to develop.  Your baby has fingerprints and toe prints. Your baby's hair grows thicker.  At the end of the second trimester, the fetus is about 9 inches (22.9 cm) long. Seventh month  The fetus kicks and stretches.  The eyes are developed enough to sense changes in  light.  The hands can make a grasping motion.  The fetus responds to sound. Eighth month  All organs and body systems are fully developed and functioning.  Bones harden, and taste buds develop. The fetus may hiccup.  Certain areas of the brain are still developing. The skull remains soft. Ninth month  The fetus gains about  lb (0.23 kg) each week.  The lungs are fully developed.  Patterns of sleep develop.  The fetus's head typically moves into a head-down position (vertex) in the uterus to prepare for birth.  The fetus weighs 6-9 lb (2.72-4.08 kg) and is 19-20 inches (48.26-50.8 cm) long. What can I do to have a healthy pregnancy and help my baby develop? General instructions  Take prenatal vitamins as directed by your health care provider. These include vitamins such as folic acid, iron, calcium, and vitamin D. They are important for healthy development.  Take medicines only as directed by your health care provider. Read labels and ask a pharmacist or your health care provider whether over-the-counter medicines, supplements, and prescription drugs are safe to take during pregnancy.  Keep all follow-up visits as directed by your health care provider. This is important. Follow-up visits include prenatal care and screening tests. How do I know if my baby is developing well? At each prenatal visit, your health care provider will do  several different tests to check on your health and keep track of your baby's development. These include:  Fundal height and position. ? Your health care provider will measure your growing belly from your pubic bone to the top of the uterus using a tape measure. ? Your health care provider will also feel your belly to determine your baby's position.  Heartbeat. ? An ultrasound in the first trimester can confirm pregnancy and show a heartbeat, depending on how far along you are. ? Your health care provider will check your baby's heart rate at every prenatal visit.  Second trimester ultrasound. ? This ultrasound checks your baby's development. It also may show your baby's gender. What should I do if I have concerns about my baby's development? Always talk with your health care provider about any concerns that you may have about your pregnancy and your baby. Summary  A pregnancy usually lasts 280 days, or about 40 weeks. Pregnancy is divided into three periods of growth, also called trimesters.  Your health care provider will monitor your baby's growth and development throughout your pregnancy.  Follow your health care provider's recommendations about taking prenatal vitamins and medicines during your pregnancy.  Talk with your health care provider if you have any concerns about your pregnancy or your developing baby. This information is not intended to replace advice given to you by your health care provider. Make sure you discuss any questions you have with your health care provider. Document Revised: 11/15/2018 Document Reviewed: 06/07/2017 Elsevier Patient Education  2020 [place] of Pregnancy The first trimester of pregnancy is from week 1 until the end of week 13 (months 1 through 3). A week after a sperm fertilizes an egg, the egg will implant on the wall of the uterus. This embryo will begin to develop into a baby. Genes from you and your partner will form the  baby. The female genes will determine whether the baby will be a boy or a girl. At 6-8 weeks, the eyes and face will be formed, and the heartbeat can be seen on ultrasound. At the end of 12 weeks, all the  baby's organs will be formed. Now that you are pregnant, you will want to do everything you can to have a healthy baby. Two of the most important things are to get good prenatal care and to follow your health care provider's instructions. Prenatal care is all the medical care you receive before the baby's birth. This care will help prevent, find, and treat any problems during the pregnancy and childbirth. Body changes during your first trimester Your body goes through many changes during pregnancy. The changes vary from woman to woman.  You may gain or lose a couple of pounds at first.  You may feel sick to your stomach (nauseous) and you may throw up (vomit). If the vomiting is uncontrollable, call your health care provider.  You may tire easily.  You may develop headaches that can be relieved by medicines. All medicines should be approved by your health care provider.  You may urinate more often. Painful urination may mean you have a bladder infection.  You may develop heartburn as a result of your pregnancy.  You may develop constipation because certain hormones are causing the muscles that push stool through your intestines to slow down.  You may develop hemorrhoids or swollen veins (varicose veins).  Your breasts may begin to grow larger and become tender. Your nipples may stick out more, and the tissue that surrounds them (areola) may become darker.  Your gums may bleed and may be sensitive to brushing and flossing.  Dark spots or blotches (chloasma, mask of pregnancy) may develop on your face. This will likely fade after the baby is born.  Your menstrual periods will stop.  You may have a loss of appetite.  You may develop cravings for certain kinds of food.  You may have  changes in your emotions from day to day, such as being excited to be pregnant or being concerned that something may go wrong with the pregnancy and baby.  You may have more vivid and strange dreams.  You may have changes in your hair. These can include thickening of your hair, rapid growth, and changes in texture. Some women also have hair loss during or after pregnancy, or hair that feels dry or thin. Your hair will most likely return to normal after your baby is born. What to expect at prenatal visits During a routine prenatal visit:  You will be weighed to make sure you and the baby are growing normally.  Your blood pressure will be taken.  Your abdomen will be measured to track your baby's growth.  The fetal heartbeat will be listened to between weeks 10 and 14 of your pregnancy.  Test results from any previous visits will be discussed. Your health care provider may ask you:  How you are feeling.  If you are feeling the baby move.  If you have had any abnormal symptoms, such as leaking fluid, bleeding, severe headaches, or abdominal cramping.  If you are using any tobacco products, including cigarettes, chewing tobacco, and electronic cigarettes.  If you have any questions. Other tests that may be performed during your first trimester include:  Blood tests to find your blood type and to check for the presence of any previous infections. The tests will also be used to check for low iron levels (anemia) and protein on red blood cells (Rh antibodies). Depending on your risk factors, or if you previously had diabetes during pregnancy, you may have tests to check for high blood sugar that affects pregnant women (gestational diabetes).  Urine tests to check for infections, diabetes, or protein in the urine.  An ultrasound to confirm the proper growth and development of the baby.  Fetal screens for spinal cord problems (spina bifida) and Down syndrome.  HIV (human immunodeficiency  virus) testing. Routine prenatal testing includes screening for HIV, unless you choose not to have this test.  You may need other tests to make sure you and the baby are doing well. Follow these instructions at home: Medicines  Follow your health care provider's instructions regarding medicine use. Specific medicines may be either safe or unsafe to take during pregnancy.  Take a prenatal vitamin that contains at least 600 micrograms (mcg) of folic acid.  If you develop constipation, try taking a stool softener if your health care provider approves. Eating and drinking   Eat a balanced diet that includes fresh fruits and vegetables, whole grains, good sources of protein such as meat, eggs, or tofu, and low-fat dairy. Your health care provider will help you determine the amount of weight gain that is right for you.  Avoid raw meat and uncooked cheese. These carry germs that can cause birth defects in the baby.  Eating four or five small meals rather than three large meals a day may help relieve nausea and vomiting. If you start to feel nauseous, eating a few soda crackers can be helpful. Drinking liquids between meals, instead of during meals, also seems to help ease nausea and vomiting.  Limit foods that are high in fat and processed sugars, such as fried and sweet foods.  To prevent constipation: ? Eat foods that are high in fiber, such as fresh fruits and vegetables, whole grains, and beans. ? Drink enough fluid to keep your urine clear or pale yellow. Activity  Exercise only as directed by your health care provider. Most women can continue their usual exercise routine during pregnancy. Try to exercise for 30 minutes at least 5 days a week. Exercising will help you: ? Control your weight. ? Stay in shape. ? Be prepared for labor and delivery.  Experiencing pain or cramping in the lower abdomen or lower back is a good sign that you should stop exercising. Check with your health care  provider before continuing with normal exercises.  Try to avoid standing for long periods of time. Move your legs often if you must stand in one place for a long time.  Avoid heavy lifting.  Wear low-heeled shoes and practice good posture.  You may continue to have sex unless your health care provider tells you not to. Relieving pain and discomfort  Wear a good support bra to relieve breast tenderness.  Take warm sitz baths to soothe any pain or discomfort caused by hemorrhoids. Use hemorrhoid cream if your health care provider approves.  Rest with your legs elevated if you have leg cramps or low back pain.  If you develop varicose veins in your legs, wear support hose. Elevate your feet for 15 minutes, 3-4 times a day. Limit salt in your diet. Prenatal care  Schedule your prenatal visits by the twelfth week of pregnancy. They are usually scheduled monthly at first, then more often in the last 2 months before delivery.  Write down your questions. Take them to your prenatal visits.  Keep all your prenatal visits as told by your health care provider. This is important. Safety  Wear your seat belt at all times when driving.  Make a list of emergency phone numbers, including numbers for family, friends, the  hospital, and police and fire departments. General instructions  Ask your health care provider for a referral to a local prenatal education class. Begin classes no later than the beginning of month 6 of your pregnancy.  Ask for help if you have counseling or nutritional needs during pregnancy. Your health care provider can offer advice or refer you to specialists for help with various needs.  Do not use hot tubs, steam rooms, or saunas.  Do not douche or use tampons or scented sanitary pads.  Do not cross your legs for long periods of time.  Avoid cat litter boxes and soil used by cats. These carry germs that can cause birth defects in the baby and possibly loss of the fetus  by miscarriage or stillbirth.  Avoid all smoking, herbs, alcohol, and medicines not prescribed by your health care provider. Chemicals in these products affect the formation and growth of the baby.  Do not use any products that contain nicotine or tobacco, such as cigarettes and e-cigarettes. If you need help quitting, ask your health care provider. You may receive counseling support and other resources to help you quit.  Schedule a dentist appointment. At home, brush your teeth with a soft toothbrush and be gentle when you floss. Contact a health care provider if:  You have dizziness.  You have mild pelvic cramps, pelvic pressure, or nagging pain in the abdominal area.  You have persistent nausea, vomiting, or diarrhea.  You have a bad smelling vaginal discharge.  You have pain when you urinate.  You notice increased swelling in your face, hands, legs, or ankles.  You are exposed to fifth disease or chickenpox.  You are exposed to [place] measles (rubella) and have never had it. Get help right away if:  You have a fever.  You are leaking fluid from your vagina.  You have spotting or bleeding from your vagina.  You have severe abdominal cramping or pain.  You have rapid weight gain or loss.  You vomit blood or material that looks like coffee grounds.  You develop a severe headache.  You have shortness of breath.  You have any kind of trauma, such as from a fall or a car accident. Summary  The first trimester of pregnancy is from week 1 until the end of week 13 (months 1 through 3).  Your body goes through many changes during pregnancy. The changes vary from woman to woman.  You will have routine prenatal visits. During those visits, your health care provider will examine you, discuss any test results you may have, and talk with you about how you are feeling. This information is not intended to replace advice given to you by your health care provider. Make sure you  discuss any questions you have with your health care provider. Document Revised: 07/07/2017 Document Reviewed: 07/06/2016 Elsevier Patient Education  2020 [employer].

## 2020-04-07 NOTE — Progress Notes (Signed)
BP 102/66   Pulse 61   Temp 98.9 F (37.2 C) (Oral)   Wt 179 lb (81.2 kg)   LMP 02/22/2020 (Exact Date)   SpO2 99%   BMI 30.37 kg/m    Subjective:    Patient ID: Caitlin Ortega, female    DOB: Feb 20, 1986, 34 y.o.   MRN: [id number]  HPI: Caitlin Ortega is a 34 y.o. female  Chief Complaint  Patient presents with  . Possible Pregnancy    pt states has had a positive pregnancy home test about a week sago.    PREGNANCY DIAGNOSIS- was taking her OCPs, then stopped them, then restarted them, then stopped them when she had a + home pregnancy test.  Gravida/Para: G2P1 Home pregnancy test: Positive x4 Nausea: yes Vomiting: no Breast tenderness: yes Abdominal pain: no Vaginal bleeding: no Pregnancy or labor complications: no Fetal abnormalities: no Contraception: OCP- now stopped  Relevant past medical, surgical, family and social history reviewed and updated as indicated. Interim medical history since our last visit reviewed. Allergies and medications reviewed and updated.  Review of Systems  Constitutional: Negative.   Respiratory: Negative.   Cardiovascular: Negative.   Gastrointestinal: Negative.   Musculoskeletal: Negative.   Neurological: Negative.   Psychiatric/Behavioral: Negative.     Per HPI unless specifically indicated above     Objective:    BP 102/66   Pulse 61   Temp 98.9 F (37.2 C) (Oral)   Wt 179 lb (81.2 kg)   LMP 02/22/2020 (Exact Date)   SpO2 99%   BMI 30.37 kg/m   Wt Readings from Last 3 Encounters:  04/07/20 179 lb (81.2 kg)  03/23/20 177 lb (80.3 kg)  02/03/20 178 lb 6.4 oz (80.9 kg)    Physical Exam Vitals and nursing note reviewed.  Constitutional:      General: She is not in acute distress.    Appearance: Normal appearance. She is not ill-appearing, toxic-appearing or diaphoretic.  HENT:     Head: Normocephalic and atraumatic.     Right Ear: External ear normal.     Left Ear: External ear normal.     Nose: Nose normal.      Mouth/Throat:     Mouth: Mucous membranes are moist.     Pharynx: Oropharynx is clear.  Eyes:     General: No scleral icterus.       Right eye: No discharge.        Left eye: No discharge.     Extraocular Movements: Extraocular movements intact.     Conjunctiva/sclera: Conjunctivae normal.     Pupils: Pupils are equal, round, and reactive to light.  Cardiovascular:     Rate and Rhythm: Normal rate and regular rhythm.     Pulses: Normal pulses.     Heart sounds: Normal heart sounds. No murmur heard.  No friction rub. No gallop.   Pulmonary:     Effort: Pulmonary effort is normal. No respiratory distress.     Breath sounds: Normal breath sounds. No stridor. No wheezing, rhonchi or rales.  Chest:     Chest wall: No tenderness.  Musculoskeletal:        General: Normal range of motion.     Cervical back: Normal range of motion and neck supple.  Skin:    General: Skin is warm and dry.     Capillary Refill: Capillary refill takes less than 2 seconds.     Coloration: Skin is not jaundiced or pale.     Findings:  No bruising, erythema, lesion or rash.  Neurological:     General: No focal deficit present.     Mental Status: She is alert and oriented to person, place, and time. Mental status is at baseline.  Psychiatric:        Mood and Affect: Mood normal.        Behavior: Behavior normal.        Thought Content: Thought content normal.        Judgment: Judgment normal.     Results for orders placed or performed in visit on 02/03/20  GC/Chlamydia Probe Amp   Specimen: Urine   UR  Result Value Ref Range   Chlamydia trachomatis, NAA Negative Negative   Neisseria Gonorrhoeae by PCR Negative Negative  Microscopic Examination   Urine  Result Value Ref Range   WBC, UA 0-5 0 - 5 /hpf   RBC 11-30 (A) 0 - 2 /hpf   Epithelial Cells (non renal) 0-10 0 - 10 /hpf   Bacteria, UA Few (A) None seen/Few  Hepatitis C antibody  Result Value Ref Range   Hep C Virus Ab <0.1 0.0 - 0.9 s/co  ratio  CBC with Differential/Platelet  Result Value Ref Range   WBC 6.1 3.4 - 10.8 x10E3/uL   RBC 4.44 3.77 - 5.28 x10E6/uL   Hemoglobin 11.0 (L) 11.1 - 15.9 g/dL   Hematocrit 35.1 34.0 - 46.6 %   MCV 79 79 - 97 fL   MCH 24.8 (L) 26.6 - 33.0 pg   MCHC 31.3 (L) 31 - 35 g/dL   RDW 15.0 11.7 - 15.4 %   Platelets 324 150 - 450 x10E3/uL   Neutrophils 55 Not Estab. %   Lymphs 34 Not Estab. %   Monocytes 6 Not Estab. %   Eos 4 Not Estab. %   Basos 1 Not Estab. %   Neutrophils Absolute 3.3 1 - 7 x10E3/uL   Lymphocytes Absolute 2.1 0 - 3 x10E3/uL   Monocytes Absolute 0.4 0 - 0 x10E3/uL   EOS (ABSOLUTE) 0.2 0.0 - 0.4 x10E3/uL   Basophils Absolute 0.1 0 - 0 x10E3/uL   Immature Granulocytes 0 Not Estab. %   Immature Grans (Abs) 0.0 0.0 - 0.1 x10E3/uL  Comprehensive metabolic panel  Result Value Ref Range   Glucose 86 65 - 99 mg/dL   BUN 12 6 - 20 mg/dL   Creatinine, Ser 0.72 0.57 - 1.00 mg/dL   GFR calc non Af Amer 112 >59 mL/min/1.73   GFR calc Af Amer 129 >59 mL/min/1.73   BUN/Creatinine Ratio 17 9 - 23   Sodium 138 134 - 144 mmol/L   Potassium 4.1 3.5 - 5.2 mmol/L   Chloride 106 96 - 106 mmol/L   CO2 23 20 - 29 mmol/L   Calcium 9.7 8.7 - 10.2 mg/dL   Total Protein 7.5 6.0 - 8.5 g/dL   Albumin 4.4 3.8 - 4.8 g/dL   Globulin, Total 3.1 1.5 - 4.5 g/dL   Albumin/Globulin Ratio 1.4 1.2 - 2.2   Bilirubin Total 0.2 0.0 - 1.2 mg/dL   Alkaline Phosphatase 117 48 - 121 IU/L   AST 24 0 - 40 IU/L   ALT 21 0 - 32 IU/L  Lipid Panel w/o Chol/HDL Ratio  Result Value Ref Range   Cholesterol, Total 157 100 - 199 mg/dL   Triglycerides 211 (H) 0 - 149 mg/dL   HDL 36 (L) >39 mg/dL   VLDL Cholesterol Cal 36 5 - 40 mg/dL   LDL Chol  Calc (NIH) 85 0 - 99 mg/dL  TSH  Result Value Ref Range   TSH 0.511 0.450 - 4.500 uIU/mL  Urinalysis, Routine w reflex microscopic  Result Value Ref Range   Specific Gravity, UA 1.025 1.005 - 1.030   pH, UA 7.0 5.0 - 7.5   Color, UA Yellow Yellow   Appearance Ur  Cloudy (A) Clear   Leukocytes,UA 1+ (A) Negative   Protein,UA Negative Negative/Trace   Glucose, UA Negative Negative   Ketones, UA Negative Negative   RBC, UA 3+ (A) Negative   Bilirubin, UA Negative Negative   Urobilinogen, Ur 0.2 0.2 - 1.0 mg/dL   Nitrite, UA Negative Negative   Microscopic Examination See below:   HIV Antibody (routine testing w rflx)  Result Value Ref Range   HIV Screen 4th Generation wRfx Non Reactive Non Reactive  RPR  Result Value Ref Range   RPR Ser Ql Non Reactive Non Reactive  HSV(herpes simplex vrs) 1+2 ab-IgG  Result Value Ref Range   HSV 1 Glycoprotein G Ab, IgG 43.50 (H) 0.00 - 0.90 index   HSV 2 IgG, Type Spec <0.91 0.00 - 0.90 index      Assessment & Plan:   Problem List Items Addressed This Visit    None    Visit Diagnoses    Less than [redacted] weeks gestation of pregnancy    -  Primary   6 weeks 3 days by LMP. Will get her into OB ASAP. Start prenatal. Call with any concerns. Continue to monitor.    Relevant Orders   Ambulatory referral to Obstetrics / Gynecology   Amenorrhea       + pregnancy on urine today.    Relevant Orders   Pregnancy, urine       Follow up plan: Return if symptoms worsen or fail to improve.

## 2020-04-27 DIAGNOSIS — F411 Generalized anxiety disorder: Secondary | ICD-10-CM | POA: Diagnosis not present

## 2020-04-27 DIAGNOSIS — F331 Major depressive disorder, recurrent, moderate: Secondary | ICD-10-CM | POA: Diagnosis not present

## 2020-05-04 ENCOUNTER — Encounter: Payer: Self-pay | Admitting: Obstetrics and Gynecology

## 2020-05-04 ENCOUNTER — Ambulatory Visit (INDEPENDENT_AMBULATORY_CARE_PROVIDER_SITE_OTHER): Payer: Medicaid Other | Admitting: Obstetrics and Gynecology

## 2020-05-04 ENCOUNTER — Other Ambulatory Visit: Payer: Self-pay

## 2020-05-04 VITALS — BP 122/74 | Wt 189.0 lb

## 2020-05-04 DIAGNOSIS — O0990 Supervision of high risk pregnancy, unspecified, unspecified trimester: Secondary | ICD-10-CM | POA: Insufficient documentation

## 2020-05-04 DIAGNOSIS — O99211 Obesity complicating pregnancy, first trimester: Secondary | ICD-10-CM | POA: Diagnosis not present

## 2020-05-04 DIAGNOSIS — Z3A10 10 weeks gestation of pregnancy: Secondary | ICD-10-CM

## 2020-05-04 DIAGNOSIS — O99210 Obesity complicating pregnancy, unspecified trimester: Secondary | ICD-10-CM | POA: Insufficient documentation

## 2020-05-04 NOTE — Progress Notes (Signed)
New Obstetric Patient H&P   Chief Complaint: "Desires prenatal care"   History of Present Illness: Patient is a 34 y.o. G2P1 Not Hispanic or Latino female, sure LMP 02/22/2020 presents with amenorrhea and positive home pregnancy test. Based on her  LMP, her EDD is Estimated Date of Delivery: 11/28/2020 and her EGA is [redacted]w[redacted]d. Cycles are regular, every 30 days, lasting 7 days. Her last pap smear was 1 years ago and was no abnormalities.    She had a urine pregnancy test which was positive 4 or 5 week(s)  ago. Since her LMP she claims she has experienced no issues. She has vaginal spotting 2 days ago that was very light. Her past medical history is noncontributory. Her prior pregnancies are notable for no issues.   Since her LMP, she admits to the use of tobacco products  no She claims she has gained 10 pounds since the start of her pregnancy.  There are cats in the home in the home  no  She admits close contact with children on a regular basis  yes  She has had chicken pox in the past yes She has had Tuberculosis exposures, symptoms, or previously tested positive for TB   no Current or past history of domestic violence. no  Genetic Screening/Teratology Counseling: (Includes patient, baby's father, or anyone in either family with:)   29. Patient's age >/= 71 at [facility]  no 2. Thalassemia ([place], [place], [place], or Asian background): MCV<80  no 3. Neural tube defect (meningomyelocele, spina bifida, anencephaly)  no 4. Congenital heart defect  no  5. Down syndrome  no 6. Tay-Sachs (Jewish, [place])  no 7. Canavan's Disease  no 8. Sickle cell disease or trait (African)  no  9. Hemophilia or other blood disorders  no  10. Muscular dystrophy  no  11. Cystic fibrosis  no  12. Huntington's Chorea  no  13. Mental retardation/autism  no 14. Other inherited genetic or chromosomal disorder  no 15. Maternal metabolic disorder (DM, PKU, etc)  no 16. Patient or FOB with a child with a  birth defect not listed above no  16a. Patient or FOB with a birth defect themselves no 17. Recurrent pregnancy loss, or stillbirth  no  18. Any medications since LMP other than prenatal vitamins (include vitamins, supplements, OTC meds, drugs, alcohol)  no 19. Any other genetic/environmental exposure to discuss  no  Infection History:   1. Lives with someone with TB or TB exposed  no  2. Patient or partner has history of genital herpes  no 3. Rash or viral illness since LMP  no 4. History of STI (GC, CT, HPV, syphilis, HIV)  distant 5. History of recent travel :  no  Other pertinent information:  no   Review of Systems:10 point review of systems negative unless otherwise noted in HPI  Past Medical History:  Diagnosis Date  . Allergy   . Asthma     Past Surgical History:  Procedure Laterality Date  . NO PAST SURGERIES      Gynecologic History: Patient's last menstrual period was 02/22/2020.  Obstetric History: G2P1  Family History  Problem Relation Age of Onset  . Hypertension Mother   . Hypertension Sister   . Heart disease Maternal Grandfather   . Diabetes Maternal Grandfather     Social History   Socioeconomic History  . Marital status: Single    Spouse name: Not on file  . Number of children: Not on file  . Years of  education: Not on file  . Highest education level: Not on file  Occupational History  . Not on file  Tobacco Use  . Smoking status: Former Research scientist (life sciences)  . Smokeless tobacco: Never Used  Vaping Use  . Vaping Use: Never used  Substance and Sexual Activity  . Alcohol use: Not Currently  . Drug use: Never  . Sexual activity: Yes    Birth control/protection: None  Other Topics Concern  . Not on file  Social History Narrative  . Not on file   Social Determinants of Health   Financial Resource Strain:   . Difficulty of Paying Living Expenses: Not on file  Food Insecurity:   . Worried About Charity fundraiser in the Last Year: Not on file  .  Ran Out of Food in the Last Year: Not on file  Transportation Needs:   . Lack of Transportation (Medical): Not on file  . Lack of Transportation (Non-Medical): Not on file  Physical Activity:   . Days of Exercise per Week: Not on file  . Minutes of Exercise per Session: Not on file  Stress:   . Feeling of Stress : Not on file  Social Connections:   . Frequency of Communication with Friends and Family: Not on file  . Frequency of Social Gatherings with Friends and Family: Not on file  . Attends Religious Services: Not on file  . Active Member of Clubs or Organizations: Not on file  . Attends Archivist Meetings: Not on file  . Marital Status: Not on file  Intimate Partner Violence:   . Fear of Current or Ex-Partner: Not on file  . Emotionally Abused: Not on file  . Physically Abused: Not on file  . Sexually Abused: Not on file    No Known Allergies  Prior to Admission medications   Medication Sig Start Date End Date Taking? Authorizing Provider  Prenat-Methylfol-Chol-Fish Oil (PRENATAL + COMPLETE MULTI) 0.267 & 373 MG THPK Take 1 tablet by mouth daily. 04/07/20  Yes Johnson, Megan P, DO  Multiple Vitamins-Minerals (MULTIVITAMIN WITH MINERALS) tablet Take 1 tablet by mouth daily. Patient not taking: Reported on 05/04/2020    [provider]    Physical Exam BP 122/74   Wt 189 lb (85.7 kg)   LMP 02/22/2020   BMI 32.07 kg/m   Physical Exam Constitutional:      General: She is not in acute distress.    Appearance: She is well-developed.  HENT:     Head: Normocephalic and atraumatic.  Eyes:     Conjunctiva/sclera: Conjunctivae normal.  Neck:     Thyroid: No thyromegaly.  Cardiovascular:     Rate and Rhythm: Normal rate and regular rhythm.     Heart sounds: Normal heart sounds. No murmur heard.  No friction rub. No gallop.   Pulmonary:     Effort: Pulmonary effort is normal.     Breath sounds: Normal breath sounds. No wheezing.  Abdominal:      General: There is no distension.     Palpations: Abdomen is soft. There is no mass.     Tenderness: There is no abdominal tenderness. There is no guarding or rebound.     Hernia: No hernia is present. There is no hernia in the left inguinal area.  Genitourinary:    Exam position: Supine.     Labia:        Right: No rash, tenderness or lesion.        Left: No rash, tenderness  or lesion.   Musculoskeletal:        General: Normal range of motion.     Cervical back: Normal range of motion and neck supple.  Skin:    General: Skin is warm and dry.     Findings: No rash.  Neurological:     Mental Status: She is alert and oriented to person, place, and time.  Psychiatric:        Behavior: Behavior normal.     Female Chaperone present during breast and/or pelvic exam.  Assessment: 34 y.o. G2P1 at [redacted]w[redacted]d presenting to initiate prenatal care  Plan: 1) Avoid alcoholic beverages. 2) Patient encouraged not to smoke.  3) Discontinue the use of all non-medicinal drugs and chemicals.  4) Take prenatal vitamins daily.  5) Nutrition, food safety (fish, cheese advisories, and high nitrite foods) and exercise discussed. 6) Hospital and practice style discussed with cross coverage system.  7) Genetic Screening, such as with 1st Trimester Screening, cell free fetal DNA, AFP testing, and Ultrasound, as well as with amniocentesis and CVS as appropriate, is discussed with patient. At the conclusion of today's visit patient undecided genetic testing 8) Patient is asked about travel to areas at risk for the [place] virus, and counseled to avoid travel and exposure to mosquitoes or sexual partners who may have themselves been exposed to the virus. Testing is discussed, and will be ordered as appropriate.   Prentice Docker, MD 05/04/2020 4:44 PM

## 2020-05-06 ENCOUNTER — Ambulatory Visit
Admission: RE | Admit: 2020-05-06 | Discharge: 2020-05-06 | Disposition: A | Discharge status: Home / Self Care | Payer: Medicaid Other | Source: Ambulatory Visit | Attending: Obstetrics and Gynecology | Admitting: Obstetrics and Gynecology

## 2020-05-06 ENCOUNTER — Other Ambulatory Visit: Payer: Self-pay

## 2020-05-06 DIAGNOSIS — O0990 Supervision of high risk pregnancy, unspecified, unspecified trimester: Secondary | ICD-10-CM | POA: Diagnosis not present

## 2020-05-06 DIAGNOSIS — Z3A10 10 weeks gestation of pregnancy: Secondary | ICD-10-CM | POA: Insufficient documentation

## 2020-05-06 DIAGNOSIS — O3680X0 Pregnancy with inconclusive fetal viability, not applicable or unspecified: Secondary | ICD-10-CM | POA: Diagnosis not present

## 2020-05-07 LAB — URINE CULTURE

## 2020-05-08 ENCOUNTER — Encounter: Payer: Self-pay | Admitting: Obstetrics and Gynecology

## 2020-05-08 ENCOUNTER — Encounter: Payer: Self-pay | Admitting: Family Medicine

## 2020-05-08 LAB — URINE DRUG PANEL 7
Amphetamines, Urine: NEGATIVE ng/mL
Barbiturate Quant, Ur: NEGATIVE ng/mL
Benzodiazepine Quant, Ur: NEGATIVE ng/mL
Cannabinoid Quant, Ur: NEGATIVE ng/mL
Cocaine (Metab.): NEGATIVE ng/mL
Opiate Quant, Ur: NEGATIVE ng/mL
PCP Quant, Ur: NEGATIVE ng/mL

## 2020-05-18 DIAGNOSIS — F331 Major depressive disorder, recurrent, moderate: Secondary | ICD-10-CM | POA: Diagnosis not present

## 2020-05-18 DIAGNOSIS — F411 Generalized anxiety disorder: Secondary | ICD-10-CM | POA: Diagnosis not present

## 2020-06-09 DIAGNOSIS — F331 Major depressive disorder, recurrent, moderate: Secondary | ICD-10-CM | POA: Diagnosis not present

## 2020-06-09 DIAGNOSIS — F411 Generalized anxiety disorder: Secondary | ICD-10-CM | POA: Diagnosis not present

## 2020-06-16 ENCOUNTER — Encounter: Payer: Self-pay | Admitting: Obstetrics and Gynecology

## 2020-06-16 ENCOUNTER — Ambulatory Visit (INDEPENDENT_AMBULATORY_CARE_PROVIDER_SITE_OTHER): Payer: Medicaid Other | Admitting: Obstetrics and Gynecology

## 2020-06-16 ENCOUNTER — Other Ambulatory Visit: Payer: Medicaid Other

## 2020-06-16 ENCOUNTER — Other Ambulatory Visit: Payer: Self-pay

## 2020-06-16 VITALS — BP 126/78 | Wt 200.0 lb

## 2020-06-16 DIAGNOSIS — R300 Dysuria: Secondary | ICD-10-CM

## 2020-06-16 DIAGNOSIS — O0990 Supervision of high risk pregnancy, unspecified, unspecified trimester: Secondary | ICD-10-CM

## 2020-06-16 DIAGNOSIS — O0992 Supervision of high risk pregnancy, unspecified, second trimester: Secondary | ICD-10-CM

## 2020-06-16 DIAGNOSIS — O99212 Obesity complicating pregnancy, second trimester: Secondary | ICD-10-CM

## 2020-06-16 DIAGNOSIS — O99211 Obesity complicating pregnancy, first trimester: Secondary | ICD-10-CM | POA: Diagnosis not present

## 2020-06-16 DIAGNOSIS — Z3A16 16 weeks gestation of pregnancy: Secondary | ICD-10-CM

## 2020-06-16 LAB — OB RESULTS CONSOLE VARICELLA ZOSTER ANTIBODY, IGG: Varicella: NON-IMMUNE/NOT IMMUNE

## 2020-06-16 NOTE — Progress Notes (Signed)
  Routine Prenatal Care Visit  Subjective  Caitlin Ortega is a 34 y.o. G2P1 at [redacted]w[redacted]d being seen today for ongoing prenatal care.  She is currently monitored for the following issues for this high-risk pregnancy and has Supervision of high risk pregnancy, antepartum and Obesity affecting pregnancy on their problem list.  ----------------------------------------------------------------------------------- Patient reports pain in her bilateral lower quadrants only when she voids. Denies pain at urethral meatus. Denies blood in urine. No pain any other time.    . Vag. Bleeding: None.  Movement: Present. Leaking Fluid denies.  ----------------------------------------------------------------------------------- The following portions of the patient's history were reviewed and updated as appropriate: allergies, current medications, past family history, past medical history, past social history, past surgical history and problem list. Problem list updated.  Objective  Blood pressure 126/78, weight 200 lb (90.7 kg), last menstrual period 02/22/2020. Pregravid weight 189 lb (85.7 kg) Total Weight Gain 11 lb (4.99 kg) Urinalysis: Urine Protein    Urine Glucose    Fetal Status: Fetal Heart Rate (bpm): 145   Movement: Present     General:  Alert, oriented and cooperative. Patient is in no acute distress.  Skin: Skin is warm and dry. No rash noted.   Cardiovascular: Normal heart rate noted  Respiratory: Normal respiratory effort, no problems with respiration noted  Abdomen: Soft, gravid, appropriate for gestational age.       Pelvic:  Cervical exam deferred        Extremities: Normal range of motion.     Mental Status: Normal mood and affect. Normal behavior. Normal judgment and thought content.   Assessment   34 y.o. G2P1 at [redacted]w[redacted]d by  11/28/2020, by Last Menstrual Period presenting for routine prenatal visit  Plan   pregnancy Problems (from 05/04/20 to present)    Problem Noted Resolved    Supervision of high risk pregnancy, antepartum 05/04/2020 by Will Bonnet, MD No   Obesity affecting pregnancy 05/04/2020 by Will Bonnet, MD No       Preterm labor symptoms and general obstetric precautions including but not limited to vaginal bleeding, contractions, leaking of fluid and fetal movement were reviewed in detail with the patient. Please refer to After Visit Summary for other counseling recommendations.   - 1h gtt today - urine culture  Return in about 3 weeks (around 07/07/2020) for Anatomy u/s and routine prenatal.   Prentice Docker, MD, [place], [facility] 06/16/2020 10:48 AM

## 2020-06-17 LAB — RPR+RH+ABO+RUB AB+AB SCR+CB...
Antibody Screen: NEGATIVE
HIV Screen 4th Generation wRfx: NONREACTIVE
Hematocrit: 33 % — ABNORMAL LOW (ref 34.0–46.6)
Hemoglobin: 10.6 g/dL — ABNORMAL LOW (ref 11.1–15.9)
Hepatitis B Surface Ag: NEGATIVE
MCH: 24.8 pg — ABNORMAL LOW (ref 26.6–33.0)
MCHC: 32.1 g/dL (ref 31.5–35.7)
MCV: 77 fL — ABNORMAL LOW (ref 79–97)
Platelets: 280 10*3/uL (ref 150–450)
RBC: 4.28 x10E6/uL (ref 3.77–5.28)
RDW: 15.9 % — ABNORMAL HIGH (ref 11.7–15.4)
RPR Ser Ql: NONREACTIVE
Rh Factor: POSITIVE
Rubella Antibodies, IGG: 2.41 index (ref >0.99)
Varicella zoster IgG: <135 index — ABNORMAL LOW (ref >165)
WBC: 9.8 10*3/uL (ref 3.4–10.8)

## 2020-06-17 LAB — GLUCOSE, 1 HOUR GESTATIONAL: Gestational Diabetes Screen: 141 mg/dL — ABNORMAL HIGH (ref 65–139)

## 2020-07-07 ENCOUNTER — Ambulatory Visit (INDEPENDENT_AMBULATORY_CARE_PROVIDER_SITE_OTHER): Payer: Medicaid Other | Admitting: Obstetrics and Gynecology

## 2020-07-07 ENCOUNTER — Encounter: Payer: Self-pay | Admitting: Obstetrics and Gynecology

## 2020-07-07 ENCOUNTER — Other Ambulatory Visit: Payer: Self-pay

## 2020-07-07 ENCOUNTER — Ambulatory Visit (INDEPENDENT_AMBULATORY_CARE_PROVIDER_SITE_OTHER): Payer: Medicaid Other

## 2020-07-07 VITALS — BP 122/74 | Wt 207.0 lb

## 2020-07-07 DIAGNOSIS — Z3A19 19 weeks gestation of pregnancy: Secondary | ICD-10-CM

## 2020-07-07 DIAGNOSIS — O99212 Obesity complicating pregnancy, second trimester: Secondary | ICD-10-CM | POA: Diagnosis not present

## 2020-07-07 DIAGNOSIS — O0992 Supervision of high risk pregnancy, unspecified, second trimester: Secondary | ICD-10-CM | POA: Diagnosis not present

## 2020-07-07 DIAGNOSIS — O99810 Abnormal glucose complicating pregnancy: Secondary | ICD-10-CM | POA: Insufficient documentation

## 2020-07-07 NOTE — Progress Notes (Signed)
Routine Prenatal Care Visit  Subjective  Caitlin Ortega is a 34 y.o. G2P1 at [redacted]w[redacted]d being seen today for ongoing prenatal care.  She is currently monitored for the following issues for this high-risk pregnancy and has Supervision of high risk pregnancy, antepartum; Obesity affecting pregnancy; and Abnormal glucose tolerance test (GTT) during pregnancy, antepartum on their problem list.  ----------------------------------------------------------------------------------- Patient reports no complaints.    . Vag. Bleeding: None.  Movement: Present. Leaking Fluid denies.  Anatomy u/s incomplete, otherwise normal. DIscussed failed 1h gtt and need for 3h gtt for confirmation testing of GDM ----------------------------------------------------------------------------------- The following portions of the patient's history were reviewed and updated as appropriate: allergies, current medications, past family history, past medical history, past social history, past surgical history and problem list. Problem list updated.  Objective  Blood pressure 122/74, weight 207 lb (93.9 kg), last menstrual period 02/22/2020. Pregravid weight 189 lb (85.7 kg) Total Weight Gain 18 lb (8.165 kg) Urinalysis: Urine Protein    Urine Glucose    Fetal Status: Fetal Heart Rate (bpm): 143 ([place])   Movement: Present     General:  Alert, oriented and cooperative. Patient is in no acute distress.  Skin: Skin is warm and dry. No rash noted.   Cardiovascular: Normal heart rate noted  Respiratory: Normal respiratory effort, no problems with respiration noted  Abdomen: Soft, gravid, appropriate for gestational age. Pain/Pressure: Absent     Pelvic:  Cervical exam deferred        Extremities: Normal range of motion.     Mental Status: Normal mood and affect. Normal behavior. Normal judgment and thought content.   Imaging Results US OB Comp + 14 Wk  Result Date: 07/07/2020 Patient Name: Caitlin Ortega DOB: 07-21-1986 MRN:  [id number] ULTRASOUND REPORT Location: [facility] Date of Service: 07/07/2020 Indications:Anatomy Ultrasound Findings: Nelda [place] intrauterine pregnancy is visualized with FHR at 143 BPM. Biometrics give an (U/S) Gestational age of [redacted]w[redacted]d and an (U/S) EDD of 11/25/2020; this correlates with the clinically established Estimated Date of Delivery: 11/28/20 Fetal presentation is Variable. EFW: 338 g ( 12 oz ). Placenta: posterior. Grade: 1 AFI: subjectively normal. Anatomic survey is incomplete for DA, RVOT and 3VV and normal; Gender - female.  Impression: 1. [redacted]w[redacted]d Viable Singleton Intrauterine pregnancy by U/S. 2. (U/S) EDD is consistent with Clinically established Estimated Date of Delivery: 11/28/20 . 3. The anatomy scan is incomplete for the DA, RVOT and the 3VV. Recommendations: 1.Clinical correlation with the patient's History and Physical Exam. 2. Completion anatomy ultrasound for above structures. Gweneth Dimitri, RT There is a singleton gestation with subjectively normal amniotic fluid volume. The fetal biometry correlates with established dating. Detailed evaluation of the fetal anatomy was performed.The fetal anatomical survey appears within normal limits within the resolution of ultrasound as described above.  Not all structures were able to be visualized on today's study.  It is recommended that a follow-up ultrasound be performed to complete visualization of the unobserved structures today.  It must be noted that a normal ultrasound is unable to rule out fetal aneuploidy nor is it able to detect all possible malformations.    The ultrasound images and findings were reviewed by me and I agree with the above report. Prentice Docker, MD, [facility], [facility] 07/07/2020 2:57 PM       Assessment   33 y.o. G2P1 at [redacted]w[redacted]d by  11/28/2020, by Last Menstrual Period presenting for routine prenatal visit  Plan   pregnancy Problems (from 05/04/20  to present)    Problem Noted Resolved    Supervision of high risk pregnancy, antepartum 05/04/2020 by Will Bonnet, MD No   Obesity affecting pregnancy 05/04/2020 by Will Bonnet, MD No       Preterm labor symptoms and general obstetric precautions including but not limited to vaginal bleeding, contractions, leaking of fluid and fetal movement were reviewed in detail with the patient. Please refer to After Visit Summary for other counseling recommendations.   Return in about 2 weeks (around 07/21/2020) for U/S for completion anatomy and Routine Prenatal Appointment(2 weeks), AND 3h gtt ASAP.   Prentice Docker, MD, [facility], [facility] 07/07/2020 3:08 PM

## 2020-07-17 ENCOUNTER — Other Ambulatory Visit: Payer: Medicaid Other

## 2020-07-17 ENCOUNTER — Other Ambulatory Visit: Payer: Self-pay

## 2020-07-21 LAB — GESTATIONAL GLUCOSE TOLERANCE

## 2020-07-22 ENCOUNTER — Ambulatory Visit (INDEPENDENT_AMBULATORY_CARE_PROVIDER_SITE_OTHER): Payer: Medicaid Other | Admitting: Obstetrics and Gynecology

## 2020-07-22 ENCOUNTER — Other Ambulatory Visit: Payer: Self-pay

## 2020-07-22 ENCOUNTER — Ambulatory Visit (INDEPENDENT_AMBULATORY_CARE_PROVIDER_SITE_OTHER): Payer: Medicaid Other

## 2020-07-22 ENCOUNTER — Encounter: Payer: Self-pay | Admitting: Obstetrics and Gynecology

## 2020-07-22 VITALS — BP 118/68 | Wt 207.0 lb

## 2020-07-22 DIAGNOSIS — J45909 Unspecified asthma, uncomplicated: Secondary | ICD-10-CM | POA: Insufficient documentation

## 2020-07-22 DIAGNOSIS — O0990 Supervision of high risk pregnancy, unspecified, unspecified trimester: Secondary | ICD-10-CM

## 2020-07-22 DIAGNOSIS — O99810 Abnormal glucose complicating pregnancy: Secondary | ICD-10-CM

## 2020-07-22 DIAGNOSIS — O99512 Diseases of the respiratory system complicating pregnancy, second trimester: Secondary | ICD-10-CM

## 2020-07-22 DIAGNOSIS — Z3A21 21 weeks gestation of pregnancy: Secondary | ICD-10-CM

## 2020-07-22 DIAGNOSIS — O0992 Supervision of high risk pregnancy, unspecified, second trimester: Secondary | ICD-10-CM | POA: Diagnosis not present

## 2020-07-22 DIAGNOSIS — O99212 Obesity complicating pregnancy, second trimester: Secondary | ICD-10-CM

## 2020-07-22 MED ORDER — ALBUTEROL SULFATE HFA 108 (90 BASE) MCG/ACT IN AERS: 2.0000 | INHALATION_SPRAY | Freq: Four times a day (QID) | RESPIRATORY_TRACT | 2 refills | Status: DC | PRN

## 2020-07-22 NOTE — Progress Notes (Signed)
Routine Prenatal Care Visit  Subjective  Caitlin Ortega is a 34 y.o. G2P1 at [redacted]w[redacted]d being seen today for ongoing prenatal care.  She is currently monitored for the following issues for this high-risk pregnancy and has Supervision of high risk pregnancy, antepartum; Obesity affecting pregnancy; Abnormal glucose tolerance test (GTT) during pregnancy, antepartum; and Asthma affecting pregnancy in second trimester on their problem list.  ----------------------------------------------------------------------------------- Patient reports increased SOB, requesting albuterol prescription for hx of asthma.    . Vag. Bleeding: None.  Movement: Present. Denies leaking of fluid.  ----------------------------------------------------------------------------------- The following portions of the patient's history were reviewed and updated as appropriate: allergies, current medications, past family history, past medical history, past social history, past surgical history and problem list. Problem list updated.   Objective  Blood pressure 118/68, weight 207 lb (93.9 kg), last menstrual period 02/22/2020. Pregravid weight 189 lb (85.7 kg) Total Weight Gain 18 lb (8.165 kg) Urinalysis:      Fetal Status: Fetal Heart Rate (bpm): 153   Movement: Present     General:  Alert, oriented and cooperative. Patient is in no acute distress.  Skin: Skin is warm and dry. No rash noted.   Cardiovascular: Normal heart rate noted  Respiratory: Normal respiratory effort, no problems with respiration noted  Abdomen: Soft, gravid, appropriate for gestational age. Pain/Pressure: Absent     Pelvic:  Cervical exam deferred        Extremities: Normal range of motion.     ental Status: Normal mood and affect. Normal behavior. Normal judgment and thought content.     Assessment   34 y.o. G2P1 at [redacted]w[redacted]d by  11/28/2020, by Last Menstrual Period presenting for routine prenatal visit  Plan   pregnancy Problems (from 05/04/20  to present)    Problem Noted Resolved   Abnormal glucose tolerance test (GTT) during pregnancy, antepartum 07/07/2020 by Will Bonnet, MD No   Overview Signed 07/07/2020  3:09 PM by Will Bonnet, MD    - failed early 1h gtt (141) [ ]  needs 3h gtt      Supervision of high risk pregnancy, antepartum 05/04/2020 by Will Bonnet, MD No   Overview Addendum 07/22/2020 11:50 AM by Orlie Pollen, [place] Prenatal Labs  Dating  LMP = 10 wk [place] Blood type: O/Positive/-- (11/09 1049)   Genetic Screen 1 Screen:    AFP:     Quad:     NIPS: Antibody:Negative (11/09 1049)  Anatomic [place]  completed with f/u - posterior placenta Rubella: 2.41 (11/09 1049) Varicella: Non-Immune  GTT Early: 141    3h: pending       Third trimester:  RPR: Non Reactive (11/09 1049)   Rhogam  n/a HBsAg: Negative (11/09 1049)   TDaP vaccine                       Flu Shot: HIV: Non Reactive (11/09 1049)   Baby Food                                GBS:   Contraception  Pap:  CBB     CS/VBAC    Support Person            Previous Version   Obesity affecting pregnancy 05/04/2020 by Will Bonnet, MD No   Overview Addendum 07/22/2020 11:40 AM by Orlie Pollen, CNM    BMI >=  35.0-39.9 [x]  early 1h gtt - 141 - 3h gtt on 07/22/20 [x]  u/s for dating  [ ]  nutritional goals [ ]  folic acid 1mg  [ ]  bASA (>12 weeks) [ ]  consider nutrition consult [ ]  consider maternal EKG 1st trimester [ ]  Growth u/s 28 [ ] , 32 [ ] , 36 weeks [ ]  [ ]  NST/AFI weekly 37+ weeks (37[] , 38[] , 39[] , 40[] ) [ ]  IOL by 41 weeks (scheduled, prn [] )       Previous Version      -Patient requesting albuterol for hx of asthma - Rx sent -Patient advised to f/u with PCP for further management -3h gtt today -anatomy US f/u wnl  Gestational age appropriate obstetric precautions including but not limited to vaginal bleeding, contractions, leaking of fluid and fetal movement were reviewed in detail with the patient.     Return in about 2 weeks (around 08/05/2020) for ROB.  Orlie Pollen, CNM, MSN [facility], [facility] 07/22/2020, 11:50 AM

## 2020-07-23 LAB — GESTATIONAL GLUCOSE TOLERANCE
Glucose, Fasting: 109 mg/dL — ABNORMAL HIGH (ref 65–94)
Glucose, GTT - 1 Hour: 177 mg/dL (ref 65–179)
Glucose, GTT - 2 Hour: 166 mg/dL — ABNORMAL HIGH (ref 65–154)
Glucose, GTT - 3 Hour: 124 mg/dL (ref 65–139)

## 2020-07-24 ENCOUNTER — Other Ambulatory Visit: Payer: Self-pay | Admitting: Obstetrics and Gynecology

## 2020-07-24 ENCOUNTER — Telehealth: Payer: Self-pay | Admitting: Obstetrics and Gynecology

## 2020-07-24 DIAGNOSIS — O99810 Abnormal glucose complicating pregnancy: Secondary | ICD-10-CM

## 2020-07-24 DIAGNOSIS — O24410 Gestational diabetes mellitus in pregnancy, diet controlled: Secondary | ICD-10-CM

## 2020-07-24 MED ORDER — ACCU-CHEK SOFTCLIX LANCETS MISC: 6 refills | Status: DC

## 2020-07-24 MED ORDER — ACCU-CHEK SMARTVIEW VI STRP: ORAL_STRIP | 12 refills | Status: DC

## 2020-07-24 MED ORDER — ACCU-CHEK NANO SMARTVIEW W/DEVICE KIT: PACK | 0 refills | Status: DC

## 2020-07-24 NOTE — Telephone Encounter (Signed)
Called patient via phone to review results of 3h gtt. Referred to [facility] Diabetes Education. Rx for meter, strips, and lancet sent. Patient to f/u within 1 week for MD visit.

## 2020-07-24 NOTE — Telephone Encounter (Signed)
Patient is scheduled for 07/27/20 at 11:30 with [facility]

## 2020-07-24 NOTE — Telephone Encounter (Signed)
Referral order updated.

## 2020-07-24 NOTE — Telephone Encounter (Signed)
-----   Message from Orlie Pollen, [place] sent at 07/24/2020  9:36 AM EST ----- Regarding: Needs physician appointment Hi, This patient needs to be seen by an MD within the week for a new diabetes diagnosis. If possible, could she see [facility]? Thanks, Anda Kraft

## 2020-07-27 ENCOUNTER — Other Ambulatory Visit: Payer: Self-pay

## 2020-07-27 ENCOUNTER — Ambulatory Visit (INDEPENDENT_AMBULATORY_CARE_PROVIDER_SITE_OTHER): Payer: Medicaid Other | Admitting: Obstetrics and Gynecology

## 2020-07-27 ENCOUNTER — Other Ambulatory Visit: Payer: Self-pay | Admitting: Obstetrics and Gynecology

## 2020-07-27 ENCOUNTER — Encounter: Payer: Self-pay | Admitting: Obstetrics and Gynecology

## 2020-07-27 DIAGNOSIS — O24410 Gestational diabetes mellitus in pregnancy, diet controlled: Secondary | ICD-10-CM

## 2020-07-27 DIAGNOSIS — O99810 Abnormal glucose complicating pregnancy: Secondary | ICD-10-CM | POA: Diagnosis not present

## 2020-07-27 DIAGNOSIS — O24419 Gestational diabetes mellitus in pregnancy, unspecified control: Secondary | ICD-10-CM | POA: Insufficient documentation

## 2020-07-27 DIAGNOSIS — O0990 Supervision of high risk pregnancy, unspecified, unspecified trimester: Secondary | ICD-10-CM

## 2020-07-27 MED ORDER — BLOOD GLUCOSE MONITOR KIT: PACK | 0 refills | Status: DC

## 2020-07-27 NOTE — Progress Notes (Signed)
Routine Prenatal Care Visit  Subjective  Caitlin Ortega is a 34 y.o. G2P1 at [redacted]w[redacted]d being seen today for ongoing prenatal care.  She is currently monitored for the following issues for this high-risk pregnancy and has Supervision of high risk pregnancy, antepartum; Obesity affecting pregnancy; Abnormal glucose tolerance test (GTT) during pregnancy, antepartum; and Asthma affecting pregnancy in second trimester on their problem list.  ----------------------------------------------------------------------------------- Patient reports no complaints.    . Vag. Bleeding: None.  Movement: Present. Denies leaking of fluid.  ----------------------------------------------------------------------------------- The following portions of the patient's history were reviewed and updated as appropriate: allergies, current medications, past family history, past medical history, past social history, past surgical history and problem list. Problem list updated.   Objective  Blood pressure 116/72, height 5\' 5"  (1.651 m), weight 205 lb (93 kg), last menstrual period 02/22/2020. Pregravid weight 189 lb (85.7 kg) Total Weight Gain 16 lb (7.258 kg) Urinalysis:      Fetal Status:     Movement: Present     General:  Alert, oriented and cooperative. Patient is in no acute distress.  Skin: Skin is warm and dry. No rash noted.   Cardiovascular: Normal heart rate noted  Respiratory: Normal respiratory effort, no problems with respiration noted  Abdomen: Soft, gravid, appropriate for gestational age. Pain/Pressure: Absent     Pelvic:  Cervical exam deferred        Extremities: Normal range of motion.     Mental Status: Normal mood and affect. Normal behavior. Normal judgment and thought content.     Assessment   34 y.o. G2P1 at [redacted]w[redacted]d by  11/28/2020, by Last Menstrual Period presenting for routine prenatal visit  Plan   pregnancy Problems (from 05/04/20 to present)    Problem Noted Resolved   Abnormal  glucose tolerance test (GTT) during pregnancy, antepartum 07/07/2020 by Will Bonnet, MD No   Overview Signed 07/07/2020  3:09 PM by Will Bonnet, MD    - failed early 50h gtt (141) [ x] needs 3h gtt      Supervision of high risk pregnancy, antepartum 05/04/2020 by Will Bonnet, MD No   Overview Addendum 07/27/2020 11:54 AM by Homero Fellers, MD     Nursing Staff Provider  Office Location  [facility] Dating   LMP = 10 wk [place]  Language  English Anatomy US  Complete  Flu Vaccine  Declines Genetic Screen   None   TDaP vaccine    Hgb A1C or  GTT Early : Third trimester : elevated 3hr GTT  Rhogam  Not needed   LAB RESULTS   Feeding Plan  Blood Type O/Positive/-- (11/09 1049)   Contraception  Antibody Negative (11/09 1049)  Circumcision  Rubella 2.41 (11/09 1049)  Pediatrician   RPR Non Reactive (11/09 1049)   Support Person  HBsAg Negative (11/09 1049)   Prenatal Classes  HIV Non Reactive (11/09 1049)    Varicella Non-immune  BTL Consent  GBS  (For PCN allergy, check sensitivities)        VBAC Consent  Pap  2020 NIL     Hgb Electro      CF      SMA         High Risk Pregnancy Diagnoses Gestational Diabetes       Previous Version   Obesity affecting pregnancy 05/04/2020 by Will Bonnet, MD No   Overview Addendum 07/22/2020 11:40 AM by Orlie Pollen, CNM    BMI >=35.0-39.9 [x]  early 1h  gtt - 141 - 3h gtt on 07/22/20 [x]  u/s for dating  [ ]  nutritional goals [ ]  folic acid 1mg  [ ]  bASA (>12 weeks) [ ]  consider nutrition consult [ ]  consider maternal EKG 1st trimester [ ]  Growth u/s 28 [ ] , 32 [ ] , 36 weeks [ ]  [ ]  NST/AFI weekly 37+ weeks (37[] , 38[] , 39[] , 40[] ) [ ]  IOL by 41 weeks (scheduled, prn [] )       Previous Version       Patient has not yet picked up meter Not able to check a random glucose because office is out of test strips Encouraged patient to obtain glucose meter and start checking glucose levels.  [name] reviewed how to  use a glucose meter Patient has consultation with nutrition 08/06/2020 Provided a patient with a glucose log.  Declined genetic screening  Gestational age appropriate obstetric precautions including but not limited to vaginal bleeding, contractions, leaking of fluid and fetal movement were reviewed in detail with the patient.    Return in about 1 week (around 08/03/2020) for [place] in person MD.  Homero Fellers MD [place], [facility] 07/27/2020, 12:15 PM

## 2020-07-27 NOTE — Patient Instructions (Signed)

## 2020-07-27 NOTE — Progress Notes (Signed)
[redacted]w[redacted]d ROB

## 2020-07-28 LAB — HEMOGLOBIN A1C
Est. average glucose Bld gHb Est-mCnc: 111 mg/dL
Hgb A1c MFr Bld: 5.5 % (ref 4.8–5.6)

## 2020-08-03 ENCOUNTER — Encounter: Payer: Medicaid Other | Admitting: Obstetrics and Gynecology

## 2020-08-03 DIAGNOSIS — F331 Major depressive disorder, recurrent, moderate: Secondary | ICD-10-CM | POA: Diagnosis not present

## 2020-08-03 DIAGNOSIS — F411 Generalized anxiety disorder: Secondary | ICD-10-CM | POA: Diagnosis not present

## 2020-08-05 ENCOUNTER — Encounter: Payer: Medicaid Other | Admitting: Obstetrics and Gynecology

## 2020-08-06 ENCOUNTER — Encounter: Payer: Medicaid Other | Attending: Obstetrics and Gynecology | Admitting: *Deleted

## 2020-08-06 ENCOUNTER — Encounter: Payer: Self-pay | Admitting: *Deleted

## 2020-08-06 ENCOUNTER — Other Ambulatory Visit: Payer: Self-pay

## 2020-08-06 VITALS — BP 102/60 | Ht 65.0 in | Wt 208.6 lb

## 2020-08-06 DIAGNOSIS — O24410 Gestational diabetes mellitus in pregnancy, diet controlled: Secondary | ICD-10-CM | POA: Diagnosis not present

## 2020-08-06 NOTE — Progress Notes (Signed)
Diabetes Self-Management Education  Visit Type: First/Initial  Appt. Start Time: 1335 Appt. End Time: 1500  08/06/2020  [name], identified by name and date of birth, is a 34 y.o. female with a diagnosis of Diabetes: Gestational Diabetes.   ASSESSMENT  Blood pressure 102/60, height 5\' 5"  (1.651 m), weight 208 lb 9.6 oz (94.6 kg), last menstrual period 02/22/2020, estimated date of delivery 11/28/2020 Body mass index is 34.71 kg/m.   Diabetes Self-Management Education - 08/06/20 1617      Visit Information   Visit Type First/Initial      Initial Visit   Diabetes Type Gestational Diabetes    Are you currently following a meal plan? No    Are you taking your medications as prescribed? Yes    Date Diagnosed Dec 2021      Health Coping   How would you rate your overall health? Fair      Psychosocial Assessment   Patient Belief/Attitude about Diabetes Other (comment)   "feel good"   Self-care barriers None    Self-management support Doctor's office;Family    Patient Concerns Nutrition/Meal planning;Weight Control    Special Needs None    Preferred Learning Style Auditory    Learning Readiness Ready    How often do you need to have someone help you when you read instructions, pamphlets, or other written materials from your doctor or pharmacy? 1 - Never    What is the last grade level you completed in school? GED      Pre-Education Assessment   Patient understands the diabetes disease and treatment process. Needs Instruction    Patient understands incorporating nutritional management into lifestyle. Needs Instruction    Patient undertands incorporating physical activity into lifestyle. Needs Instruction    Patient understands using medications safely. Needs Instruction    Patient understands monitoring blood glucose, interpreting and using results Needs Instruction    Patient understands prevention, detection, and treatment of acute complications. Needs Instruction     Patient understands prevention, detection, and treatment of chronic complications. Needs Instruction    Patient understands how to develop strategies to address psychosocial issues. Needs Instruction    Patient understands how to develop strategies to promote health/change behavior. Needs Instruction      Complications   Last HgB A1C per patient/outside source 5.5 %   07/27/2020   How often do you check your blood sugar? 0 times/day (not testing)   Provided Accu-Chek Guide Me meter and instructed on use. BG upon return demonstration was 83 mg/dL at 07/29/2020 pm - 2 hrs pp.   Have you had a dilated eye exam in the past 12 months? No    Have you had a dental exam in the past 12 months? Yes    Are you checking your feet? Yes    How many days per week are you checking your feet? 2      Dietary Intake   Breakfast microwave meals - egg and sausage bowls, egg and sausage burrito    Snack (morning) pt reports 4 snacks/day and sometimes 4-5 on week-ends - fruit (watermelon, pears, chips, cereal and milk)    Lunch chicken salad with lettuce and corn; sub way    Dinner fried fish, chicken, potaotes, corn, fruit - doesn't eat green vegetables    Beverage(s) water, juice, Ginger ale      Exercise   Exercise Type ADL's      Patient Education   Previous Diabetes Education No    Disease state  Definition of diabetes, type 1 and 2, and the diagnosis of diabetes;Factors that contribute to the development of diabetes    Nutrition management  Role of diet in the treatment of diabetes and the relationship between the three main macronutrients and blood glucose level;Food label reading, portion sizes and measuring food.;Reviewed blood glucose goals for pre and post meals and how to evaluate the patients' food intake on their blood glucose level.    Physical activity and exercise  Role of exercise on diabetes management, blood pressure control and cardiac health.    Medications Other (comment)   Limited use of  oral medications during pregnancy and potential for insulin   Monitoring Taught/evaluated SMBG meter.;Purpose and frequency of SMBG.;Taught/discussed recording of test results and interpretation of SMBG.;Identified appropriate SMBG and/or A1C goals.;Ketone testing, when, how.    Chronic complications Relationship between chronic complications and blood glucose control    Psychosocial adjustment Identified and addressed patients feelings and concerns about diabetes    Preconception care Pregnancy and GDM  Role of pre-pregnancy blood glucose control on the development of the fetus;Reviewed with patient blood glucose goals with pregnancy;Role of family planning for patients with diabetes      Individualized Goals (developed by patient)   Reducing Risk Other (comment)   lose weight     Outcomes   Expected Outcomes Demonstrated interest in learning. Expect positive outcomes           Individualized Plan for Diabetes Self-Management Training:   Learning Objective:  Patient will have a greater understanding of diabetes self-management. Patient education plan is to attend individual and/or group sessions per assessed needs and concerns.   Plan:   Patient Instructions  Read booklet on Gestational Diabetes Follow Gestational Meal Planning Guidelines Allow 2-3 hours between meals and snacks Limit foods high in fat Avoid sugar sweetened drinks (juice, Ginger ale) Complete a 3 Day Food Record and bring to next appointment Check blood sugars 4 x day - before breakfast and 2 hrs after every meal and record  Bring blood sugar log to all appointments Call MD for prescription for meter strips and lancets Strips  Accu-Chek Guide Lancets   Accu-Chek Softclix Purchase urine ketone strips if instructed by MD and check urine ketones every am:  If + increase bedtime snack to 1 protein and 2 carbohydrate servings Walk 20-30 minutes at least 5 x week if permitted by MD  Expected Outcomes:  Demonstrated  interest in learning. Expect positive outcomes  Education material provided:  Gestational Booklet Gestational Meal Planning Guidelines Simple Meal Plan Viewed Gestational Diabetes Video Meter = Accu-Chek Guide Me 3 Day Food Record Goals for a Healthy Pregnancy  If problems or questions, patient to contact team via:  Caitlin Drilling, RN, [place], [place] [phone]  Future DSME appointment:  August 12, 2020 with the dietitian

## 2020-08-06 NOTE — Patient Instructions (Signed)
Read booklet on Gestational Diabetes Follow Gestational Meal Planning Guidelines Allow 2-3 hours between meals and snacks Limit foods high in fat Avoid sugar sweetened drinks (juice, Ginger ale) Complete a 3 Day Food Record and bring to next appointment Check blood sugars 4 x day - before breakfast and 2 hrs after every meal and record  Bring blood sugar log to all appointments Call MD for prescription for meter strips and lancets Strips  Accu-Chek Guide Lancets   Accu-Chek Softclix Purchase urine ketone strips if instructed by MD and check urine ketones every am:  If + increase bedtime snack to 1 protein and 2 carbohydrate servings Walk 20-30 minutes at least 5 x week if permitted by MD

## 2020-08-08 ENCOUNTER — Other Ambulatory Visit: Payer: Self-pay

## 2020-08-08 ENCOUNTER — Emergency Department
Admission: EM | Admit: 2020-08-08 | Discharge: 2020-08-08 | Disposition: A | Discharge status: Home / Self Care | Payer: Medicaid Other | Attending: Emergency Medicine | Admitting: Emergency Medicine

## 2020-08-08 DIAGNOSIS — J45909 Unspecified asthma, uncomplicated: Secondary | ICD-10-CM | POA: Insufficient documentation

## 2020-08-08 DIAGNOSIS — O98512 Other viral diseases complicating pregnancy, second trimester: Secondary | ICD-10-CM | POA: Insufficient documentation

## 2020-08-08 DIAGNOSIS — Z3A24 24 weeks gestation of pregnancy: Secondary | ICD-10-CM | POA: Diagnosis not present

## 2020-08-08 DIAGNOSIS — U071 COVID-19: Secondary | ICD-10-CM | POA: Diagnosis not present

## 2020-08-08 DIAGNOSIS — Z87891 Personal history of nicotine dependence: Secondary | ICD-10-CM | POA: Diagnosis not present

## 2020-08-08 DIAGNOSIS — Z20822 Contact with and (suspected) exposure to covid-19: Secondary | ICD-10-CM

## 2020-08-08 NOTE — ED Triage Notes (Signed)
Pt comes pov with headache, chills, body aches. Here with daughter who is also sick.

## 2020-08-08 NOTE — ED Notes (Signed)
Pt pulled to room and triaged by PA. D/C instructions reviewed with patient by PA. Pt D/C by PA at this time.

## 2020-08-08 NOTE — L&D Delivery Note (Signed)
Delivery Note At 10:26 AM a viable female was delivered via Vaginal, Spontaneous (Presentation: Middle Occiput Anterior).  APGAR: 8, 9; weight pendng.   Placenta status: Spontaneous, Intact.  Cord: 3 vessels with the following complications: none.  Cord pH: n/a  Anesthesia: Epidural Episiotomy: None Lacerations: None Suture Repair: n/a Est. Blood Loss (mL): 350  Mom to postpartum.  Baby to Couplet care / Skin to Skin.  Called to see patient.  Mom pushed to deliver a viable female infant.  The head followed by shoulders, which delivered without difficulty, and the rest of the body.  No nuchal cord noted.  Baby to mom's chest.  Cord clamped and cut after > 1 min delay.  Cord blood obtained.  Placenta delivered spontaneously, intact, with a 3-vessel cord.  No vaginal, cervical, or perineal lacerations. All counts correct.  Hemostasis obtained with IV pitocin and fundal massage. EBL 350 mL.     Prentice Docker, MD 11/14/2020, 10:51 AM

## 2020-08-08 NOTE — ED Provider Notes (Signed)
[facility] Emergency Department Provider Note  ____________________________________________   Event Date/Time   First MD Initiated Contact with Patient 08/08/20 1616     (approximate)  I have reviewed the triage vital signs and the nursing notes.   HISTORY  Chief Complaint Chills and Generalized Body Aches    HPI Caitlin Ortega is a 35 y.o. female presents to the emergency department with URI symptoms.  Is complaining of cough, congestion, fever, chills, denies chest pain, denies shortness of breath unsure of close contact with Covid19+ patient, patient is not vaccinated.  Patient is approximately [redacted] weeks pregnant   Past Medical History:  Diagnosis Date  . Allergy   . Asthma   . Gestational diabetes     Patient Active Problem List   Diagnosis Date Noted  . Gestational diabetes mellitus (GDM) in second trimester 07/27/2020  . Asthma affecting pregnancy in second trimester 07/22/2020  . Abnormal glucose tolerance test (GTT) during pregnancy, antepartum 07/07/2020  . Supervision of high risk pregnancy, antepartum 05/04/2020  . Obesity affecting pregnancy 05/04/2020    Past Surgical History:  Procedure Laterality Date  . NO PAST SURGERIES      Prior to Admission medications   Medication Sig Start Date End Date Taking? Authorizing Provider  Accu-Chek Softclix Lancets lancets Please monitor your blood glucose in the morning, prior to eating, and two hours after eating your meals throughout the day. You will receive further instruction at you nutrition and diabetes education appointment. Patient not taking: Reported on 08/06/2020 07/27/20   Orlie Pollen, CNM  albuterol (VENTOLIN HFA) 108 (90 Base) MCG/ACT inhaler Inhale 2 puffs into the lungs every 6 (six) hours as needed for wheezing or shortness of breath. 07/22/20   Orlie Pollen, CNM  blood glucose meter kit and supplies KIT Dispense based on patient and insurance preference. Use up to four  times daily as directed. Please monitor blood glucose in the morning before breakfast and two hours after each meal during the day. You will receive further information on monitoring at your nutrition and diabetic education appointment. Patient not taking: Reported on 08/06/2020 07/27/20   Orlie Pollen, CNM  Blood Glucose Monitoring Suppl (ACCU-CHEK NANO SMARTVIEW) w/Device KIT Use as directed Patient not taking: Reported on 08/06/2020 07/24/20   Orlie Pollen, CNM  glucose blood (ACCU-CHEK SMARTVIEW) test strip Use as instructed Patient not taking: Reported on 08/06/2020 07/24/20   Orlie Pollen, CNM  Multiple Vitamins-Minerals (MULTIVITAMIN WITH MINERALS) tablet Take 1 tablet by mouth daily. Patient not taking: Reported on 05/04/2020    [provider]  Prenat-Methylfol-Chol-Fish Oil (PRENATAL + COMPLETE MULTI) 0.267 & 373 MG THPK Take 1 tablet by mouth daily. 04/07/20   Valerie Roys, DO    Allergies Patient has no known allergies.  Family History  Problem Relation Age of Onset  . Hypertension Mother   . Hypertension Sister   . Heart disease Maternal Grandfather     Social History Social History   Tobacco Use  . Smoking status: Former Research scientist (life sciences)  . Smokeless tobacco: Never Used  Vaping Use  . Vaping Use: Never used  Substance Use Topics  . Alcohol use: Not Currently  . Drug use: Never    Review of Systems  Constitutional: Positive fever/chills Eyes: No visual changes. ENT: Positive sore throat. Respiratory: Positive cough Genitourinary: Negative for dysuria. Musculoskeletal: Negative for back pain. Skin: Negative for rash.    ____________________________________________   PHYSICAL EXAM:  VITAL SIGNS: ED Triage Vitals  Enc Vitals  Group     BP 08/08/20 1441 124/84     Pulse Rate 08/08/20 1441 (!) 102     Resp 08/08/20 1441 18     Temp 08/08/20 1441 99.7 F (37.6 C)     Temp Source 08/08/20 1441 Oral     SpO2 08/08/20 1441 99 %     Weight 08/08/20  1439 209 lb 7 oz (95 kg)     Height 08/08/20 1439 '5\' 5"'  (1.651 m)     Head Circumference --      Peak Flow --      Pain Score 08/08/20 1439 7     Pain Loc --      Pain Edu? --      Excl. in [place]? --     Constitutional: Alert and oriented. Well appearing and in no acute distress. Eyes: Conjunctivae are normal.  Head: Atraumatic. Nose: No congestion/rhinnorhea. Mouth/Throat: Mucous membranes are moist.   Neck:  supple no lymphadenopathy noted Cardiovascular: Normal rate, regular rhythm. Heart sounds are normal Respiratory: Normal respiratory effort.  No retractions, lungs CTa GU: deferred Musculoskeletal: FROM all extremities, warm and well perfused Neurologic:  Normal speech and language.  Skin:  Skin is warm, dry and intact. No rash noted. Psychiatric: Mood and affect are normal. Speech and behavior are normal.  ____________________________________________   LABS (all labs ordered are listed, but only abnormal results are displayed)  Labs Reviewed  SARS CORONAVIRUS 2 (TAT 6-24 HRS)   ____________________________________________   ____________________________________________  RADIOLOGY    ____________________________________________   PROCEDURES  Procedure(s) performed: No  Procedures    ____________________________________________   INITIAL IMPRESSION / ASSESSMENT AND PLAN / ED COURSE  Pertinent labs & imaging results that were available during my care of the patient were reviewed by me and considered in my medical decision making (see chart for details).   Patient is a 35 year old 24-week pregnant female who complains of URI symptoms.  Exam is consistent with covid.    Pending test for covid   The patient was instructed to quarantine themselves at home.  Follow-up with your regular doctor if any concerns.  Return emergency department for worsening. OTC measures discussed.  Work note was given     Caitlin Ortega was evaluated in Emergency Department  on 08/08/2020 for the symptoms described in the history of present illness. She was evaluated in the context of the global COVID-19 pandemic, which necessitated consideration that the patient might be at risk for infection with the SARS-CoV-2 virus that causes COVID-19. Institutional protocols and algorithms that pertain to the evaluation of patients at risk for COVID-19 are in a state of rapid change based on information released by regulatory bodies including the CDC and federal and state organizations. These policies and algorithms were followed during the patient's care in the ED.   As part of my medical decision making, I reviewed the following data within the [facility] notes reviewed and incorporated, Old chart reviewed, Notes from prior ED visits and [place] Controlled Substance Database  ____________________________________________   FINAL CLINICAL IMPRESSION(S) / ED DIAGNOSES  Final diagnoses:  Suspected COVID-19 virus infection      NEW MEDICATIONS STARTED DURING THIS VISIT:  Discharge Medication List as of 08/08/2020  4:23 PM       Note:  This document was prepared using Dragon voice recognition software and may include unintentional dictation errors.    Versie Starks, PA-C 08/08/20 1729    Carrie Mew, MD 08/09/20 [phone]

## 2020-08-08 NOTE — Discharge Instructions (Signed)
Follow up with your regular doctor if not better in 3 to 5 days Return to the ER if worsening TAke otc mucinex, vit c , vit d, and zinc

## 2020-08-09 LAB — SARS CORONAVIRUS 2 (TAT 6-24 HRS): SARS Coronavirus 2: POSITIVE — AB

## 2020-08-10 ENCOUNTER — Telehealth: Payer: Self-pay

## 2020-08-10 NOTE — Telephone Encounter (Signed)
Transition Care Management Follow-up Telephone Call  Date of discharge and from where: 08/08/2020 from [facility].   How have you been since you were released from the hospital? Patient states that she is feeling well.   Any questions or concerns? No  Items Reviewed:  Did the pt receive and understand the discharge instructions provided? Yes   Medications obtained and verified? Yes   Other? No   Any new allergies since your discharge? No   Dietary orders reviewed? N/A  Do you have support at home? Yes   Functional Questionnaire: (I = Independent and D = Dependent) ADLs: I  Bathing/Dressing- I  Meal Prep- I  Eating- I  Maintaining continence- I  Transferring/Ambulation- I  Managing Meds- I   Follow up appointments reviewed:   Are transportation arrangements needed? No   If their condition worsens, is the pt aware to call PCP or go to the Emergency Dept.? Yes  Was the patient provided with contact information for the PCP's office or ED? Yes  Was to pt encouraged to call back with questions or concerns? Yes

## 2020-08-12 ENCOUNTER — Ambulatory Visit: Payer: Medicaid Other | Admitting: Dietician

## 2020-08-12 ENCOUNTER — Telehealth: Payer: Self-pay | Admitting: Dietician

## 2020-08-12 NOTE — Telephone Encounter (Signed)
Called patient to reschedule her cancelled appointment from today. Rescheduled for 08/24/20 at 2:15pm.

## 2020-08-14 ENCOUNTER — Telehealth: Payer: Self-pay | Admitting: Obstetrics and Gynecology

## 2020-08-14 ENCOUNTER — Encounter: Payer: Self-pay | Admitting: Obstetrics and Gynecology

## 2020-08-14 ENCOUNTER — Observation Stay
Admission: EM | Admit: 2020-08-14 | Discharge: 2020-08-15 | Disposition: A | Discharge status: Home / Self Care | Payer: Medicaid Other | Attending: Obstetrics and Gynecology | Admitting: Obstetrics and Gynecology

## 2020-08-14 ENCOUNTER — Other Ambulatory Visit: Payer: Self-pay

## 2020-08-14 DIAGNOSIS — O98513 Other viral diseases complicating pregnancy, third trimester: Secondary | ICD-10-CM | POA: Diagnosis not present

## 2020-08-14 DIAGNOSIS — O99212 Obesity complicating pregnancy, second trimester: Secondary | ICD-10-CM

## 2020-08-14 DIAGNOSIS — R1032 Left lower quadrant pain: Secondary | ICD-10-CM | POA: Insufficient documentation

## 2020-08-14 DIAGNOSIS — R109 Unspecified abdominal pain: Secondary | ICD-10-CM

## 2020-08-14 DIAGNOSIS — J45909 Unspecified asthma, uncomplicated: Secondary | ICD-10-CM | POA: Diagnosis not present

## 2020-08-14 DIAGNOSIS — O98512 Other viral diseases complicating pregnancy, second trimester: Secondary | ICD-10-CM

## 2020-08-14 DIAGNOSIS — Z87891 Personal history of nicotine dependence: Secondary | ICD-10-CM | POA: Insufficient documentation

## 2020-08-14 DIAGNOSIS — U071 COVID-19: Secondary | ICD-10-CM | POA: Diagnosis not present

## 2020-08-14 DIAGNOSIS — O26892 Other specified pregnancy related conditions, second trimester: Secondary | ICD-10-CM | POA: Diagnosis not present

## 2020-08-14 DIAGNOSIS — O24419 Gestational diabetes mellitus in pregnancy, unspecified control: Secondary | ICD-10-CM

## 2020-08-14 DIAGNOSIS — Z3A24 24 weeks gestation of pregnancy: Secondary | ICD-10-CM

## 2020-08-14 DIAGNOSIS — O99810 Abnormal glucose complicating pregnancy: Secondary | ICD-10-CM

## 2020-08-14 DIAGNOSIS — Z3A28 28 weeks gestation of pregnancy: Secondary | ICD-10-CM | POA: Insufficient documentation

## 2020-08-14 DIAGNOSIS — O24439 Gestational diabetes mellitus in the puerperium, unspecified control: Secondary | ICD-10-CM | POA: Insufficient documentation

## 2020-08-14 DIAGNOSIS — O0990 Supervision of high risk pregnancy, unspecified, unspecified trimester: Secondary | ICD-10-CM

## 2020-08-14 LAB — CBC
HCT: 32.2 % — ABNORMAL LOW (ref 36.0–46.0)
Hemoglobin: 10.3 g/dL — ABNORMAL LOW (ref 12.0–15.0)
MCH: 24.5 pg — ABNORMAL LOW (ref 26.0–34.0)
MCHC: 32 g/dL (ref 30.0–36.0)
MCV: 76.7 fL — ABNORMAL LOW (ref 80.0–100.0)
Platelets: 286 10*3/uL (ref 150–400)
RBC: 4.2 MIL/uL (ref 3.87–5.11)
RDW: 15 % (ref 11.5–15.5)
WBC: 10.4 10*3/uL (ref 4.0–10.5)
nRBC: 0 % (ref 0.0–0.2)

## 2020-08-14 LAB — URINALYSIS, ROUTINE W REFLEX MICROSCOPIC
Bilirubin Urine: NEGATIVE
Glucose, UA: NEGATIVE mg/dL
Hgb urine dipstick: NEGATIVE
Ketones, ur: 5 mg/dL — AB
Leukocytes,Ua: NEGATIVE
Nitrite: NEGATIVE
Protein, ur: NEGATIVE mg/dL
Specific Gravity, Urine: 1.017 (ref 1.005–1.030)
pH: 6 (ref 5.0–8.0)

## 2020-08-14 LAB — COMPREHENSIVE METABOLIC PANEL
ALT: 21 U/L (ref 0–44)
AST: 26 U/L (ref 15–41)
Albumin: 2.8 g/dL — ABNORMAL LOW (ref 3.5–5.0)
Alkaline Phosphatase: 98 U/L (ref 38–126)
Anion gap: 10 (ref 5–15)
BUN: 7 mg/dL (ref 6–20)
CO2: 21 mmol/L — ABNORMAL LOW (ref 22–32)
Calcium: 9.5 mg/dL (ref 8.9–10.3)
Chloride: 106 mmol/L (ref 98–111)
Creatinine, Ser: 0.43 mg/dL — ABNORMAL LOW (ref 0.44–1.00)
GFR, Estimated: >60 mL/min (ref >60)
Glucose, Bld: 116 mg/dL — ABNORMAL HIGH (ref 70–99)
Potassium: 3.4 mmol/L — ABNORMAL LOW (ref 3.5–5.1)
Sodium: 137 mmol/L (ref 135–145)
Total Bilirubin: 0.7 mg/dL (ref 0.3–1.2)
Total Protein: 6.8 g/dL (ref 6.5–8.1)

## 2020-08-14 LAB — TYPE AND SCREEN
ABO/RH(D): O POS
Antibody Screen: NEGATIVE

## 2020-08-14 LAB — ABO/RH: ABO/RH(D): O POS

## 2020-08-14 LAB — GLUCOSE, CAPILLARY: Glucose-Capillary: 126 mg/dL — ABNORMAL HIGH (ref 70–99)

## 2020-08-14 MED ORDER — ACETAMINOPHEN 500 MG PO TABS: 1000.0000 mg | ORAL_TABLET | Freq: Once | ORAL | Status: AC | PRN

## 2020-08-14 MED ORDER — MORPHINE SULFATE (PF) 2 MG/ML IV SOLN
2.0000 mg | Freq: Once | INTRAVENOUS | Status: DC | PRN
Start: 2020-08-14 — End: 2020-08-15

## 2020-08-14 MED ORDER — ACETAMINOPHEN 500 MG PO TABS
ORAL_TABLET | ORAL | Status: AC
  Administered 2020-08-14: 1000 mg via ORAL
  Filled 2020-08-14: qty 2

## 2020-08-14 MED ORDER — SIMETHICONE 80 MG PO CHEW
CHEWABLE_TABLET | ORAL | Status: AC
  Administered 2020-08-14: 80 mg via ORAL
  Filled 2020-08-14: qty 1

## 2020-08-14 MED ORDER — SIMETHICONE 80 MG PO CHEW: 80.0000 mg | CHEWABLE_TABLET | Freq: Four times a day (QID) | ORAL | Status: DC | PRN

## 2020-08-14 MED ORDER — LACTATED RINGERS IV BOLUS
500.0000 mL | Freq: Once | INTRAVENOUS | Status: AC
  Administered 2020-08-14: 500 mL via INTRAVENOUS

## 2020-08-14 NOTE — Telephone Encounter (Signed)
Patient aware to report to hospital

## 2020-08-14 NOTE — Telephone Encounter (Signed)
Patient is calling with lower abdominal pain. Patient report it is has been 24 hour an constant pressure. Patient report 3 episodes  with diarrhea  Please advise  [phone]

## 2020-08-14 NOTE — OB Triage Note (Signed)
Patient arrived in triage with c/o left lower abdominal pain 10/10 that started yesterday at 3pm. Reports it is constant, stabbing. Pt reports "being in the bed" since she was diagnosed with covid on Jan 1. Reports good fetal movement. Denies leaking of fluid or vaginal bleeding. Abdomen tender to touch on lower left side. Soft otherwise. FHT obtained in the 150's via efm. Difficulty continuously monitoring fetal heart tones due to fetal size and movement, and maternal intolerance due to abdominal pain. No contractions noted on toco.

## 2020-08-14 NOTE — H&P (Addendum)
Caitlin Ortega is an 35 y.o. female.   Chief Complaint: Abdominal pain in pregnancy HPI: Patient presents to L&D with complaints of severe abdominal pain in pregnancy. Pain is left sided. She describes it as constant. She reports the pain has been present for 2 days. She reports a lot of fetal movement. She denies leakage of fluid. She denies vaginal bleeding.  She had 3 episodes of diarrhea today. She denies any concerning food exposures. She denies blood or mucus in her stools.    She denies cough, respiratory symptoms or shortness of breath. She denies fevers. She tested positive for covid last week and say "I only had covid for one day."    pregnancy Problems (from 05/04/20 to present)    Problem Noted Resolved   Gestational diabetes mellitus (GDM) in second trimester 07/27/2020 by Homero Fellers, MD No   Abnormal glucose tolerance test (GTT) during pregnancy, antepartum 07/07/2020 by Will Bonnet, MD No   Overview Addendum 07/27/2020 12:16 PM by Homero Fellers, MD    - failed early 1h gtt (141) [x ] needs 3h gtt- elevated      Previous Version   Supervision of high risk pregnancy, antepartum 05/04/2020 by Will Bonnet, MD No   Overview Addendum 07/27/2020 12:15 PM by Homero Fellers, MD     Nursing Staff Provider  Office Location  [facility] Dating   LMP = 10 wk [place]  Language  English Anatomy US  Complete  Flu Vaccine  Declines Genetic Screen   None   TDaP vaccine    Hgb A1C or  GTT Early : elevated 3hr GTT at 20 weeks  Rhogam  Not needed   LAB RESULTS   Feeding Plan  Blood Type O/Positive/-- (11/09 1049)   Contraception  Antibody Negative (11/09 1049)  Circumcision  Rubella 2.41 (11/09 1049)  Pediatrician   RPR Non Reactive (11/09 1049)   Support Person  HBsAg Negative (11/09 1049)   Prenatal Classes  HIV Non Reactive (11/09 1049)    Varicella Non-immune  BTL Consent  GBS  (For PCN allergy, check sensitivities)        VBAC Consent  Pap  2020  NIL     Hgb Electro      CF      SMA         High Risk Pregnancy Diagnoses Gestational Diabetes       Previous Version   Obesity affecting pregnancy 05/04/2020 by Will Bonnet, MD No   Overview Addendum 07/22/2020 11:40 AM by Orlie Pollen, CNM    BMI >=35.0-39.9 '[x]'  early 1h gtt - 141 - 3h gtt on 07/22/20 '[x]'  u/s for dating  '[ ]'  nutritional goals '[ ]'  folic acid 73m '[ ]'  bASA (>12 weeks) '[ ]'  consider nutrition consult '[ ]'  consider maternal EKG 1st trimester '[ ]'  Growth u/s 28 '[ ]' , 32 '[ ]' , 36 weeks '[ ]'  '[ ]'  NST/AFI weekly 37+ weeks (37'[]' , 38'[]' , 39'[]' , 40'[]' ) '[ ]'  IOL by 41 weeks (scheduled, prn '[]' )       Previous Version       Past Medical History:  Diagnosis Date  . Allergy   . Asthma   . Gestational diabetes     Past Surgical History:  Procedure Laterality Date  . NO PAST SURGERIES      Family History  Problem Relation Age of Onset  . Hypertension Mother   . Hypertension Sister   . Heart disease Maternal Grandfather  Social History:  reports that she has quit smoking. She has never used smokeless tobacco. She reports previous alcohol use. She reports that she does not use drugs.  Allergies: No Known Allergies  Medications Prior to Admission  Medication Sig Dispense Refill  . Accu-Chek Softclix Lancets lancets Please monitor your blood glucose in the morning, prior to eating, and two hours after eating your meals throughout the day. You will receive further instruction at you nutrition and diabetes education appointment. (Patient not taking: Reported on 08/06/2020) 100 each 6  . albuterol (VENTOLIN HFA) 108 (90 Base) MCG/ACT inhaler Inhale 2 puffs into the lungs every 6 (six) hours as needed for wheezing or shortness of breath. 8 g 2  . blood glucose meter kit and supplies KIT Dispense based on patient and insurance preference. Use up to four times daily as directed. Please monitor blood glucose in the morning before breakfast and two hours after each meal  during the day. You will receive further information on monitoring at your nutrition and diabetic education appointment. (Patient not taking: Reported on 08/06/2020) 1 each 0  . Blood Glucose Monitoring Suppl (ACCU-CHEK NANO SMARTVIEW) w/Device KIT Use as directed (Patient not taking: Reported on 08/06/2020) 1 kit 0  . glucose blood (ACCU-CHEK SMARTVIEW) test strip Use as instructed (Patient not taking: Reported on 08/06/2020) 100 each 12  . Multiple Vitamins-Minerals (MULTIVITAMIN WITH MINERALS) tablet Take 1 tablet by mouth daily. (Patient not taking: Reported on 05/04/2020)    . Prenat-Methylfol-Chol-Fish Oil (PRENATAL + COMPLETE MULTI) 0.267 & 373 MG THPK Take 1 tablet by mouth daily. 90 each 3    Results for orders placed or performed during the hospital encounter of 08/14/20 (from the past 48 hour(s))  Glucose, capillary     Status: Abnormal   Collection Time: 08/14/20  9:12 PM  Result Value Ref Range   Glucose-Capillary 126 (H) 70 - 99 mg/dL    Comment: Glucose reference range applies only to samples taken after fasting for at least 8 hours.   No results found.  Review of Systems  Constitutional: Negative for chills and fever.  HENT: Negative for congestion, hearing loss and sinus pain.   Respiratory: Negative for cough, shortness of breath and wheezing.   Cardiovascular: Negative for chest pain, palpitations and leg swelling.  Gastrointestinal: Negative for abdominal pain, constipation, diarrhea, nausea and vomiting.  Genitourinary: Negative for dysuria, flank pain, frequency, hematuria and urgency.  Musculoskeletal: Negative for back pain.  Skin: Negative for rash.  Neurological: Negative for dizziness and headaches.  Psychiatric/Behavioral: Negative for suicidal ideas. The patient is not nervous/anxious.     Blood pressure 124/66, pulse (!) 101, temperature 98.7 F (37.1 C), temperature source Oral, resp. rate 18, height '5\' 5"'  (1.651 m), weight 93.9 kg, last menstrual period  02/22/2020, SpO2 98 %. Physical Exam Vitals and nursing note reviewed.  Constitutional:      Appearance: She is well-developed and well-nourished.  HENT:     Head: Normocephalic and atraumatic.  Cardiovascular:     Rate and Rhythm: Normal rate and regular rhythm.  Pulmonary:     Effort: Pulmonary effort is normal.     Breath sounds: Normal breath sounds.  Abdominal:     General: Bowel sounds are normal.     Palpations: Abdomen is soft.     Tenderness: There is abdominal tenderness in the left lower quadrant. There is no right CVA tenderness, left CVA tenderness, guarding or rebound.     Hernia: No hernia is present.  Musculoskeletal:        General: Normal range of motion.  Skin:    General: Skin is warm and dry.  Neurological:     Mental Status: She is alert and oriented to person, place, and time.  Psychiatric:        Mood and Affect: Mood and affect normal.        Behavior: Behavior normal.        Thought Content: Thought content normal.        Judgment: Judgment normal.     Bedside US, Infant cephalic, FHT in 726O. Visually adequate fluid. Placenta posterior and fundal, no evidence of abruption seen. [place] limited by patient not tolerating pressure of transducer and grabbing my arm.   Assessment/Plan 35 yo with abdominal pain in pregnancy 1. Covid positive, denies respiratory symptoms. Normal temperature and O2.  Possible colitis related to covid infection.  Supportive care with IV fluids. If diarrhea occurs again can consider imodium.  2. Labs: CBC, CMP, PT/PTT, KB, type and screen 3. Patient offered morphine for pain, but declined, accepted tylenol.  4. Gestational vs. Type 2 diabetic- initial glucose value here slightly elevated.   More than 50 minutes were spent face to face with the patient in the room, reviewing the medical record, labs and images, and coordinating care for the patient. The plan of management was discussed in detail and counseling was provided.     Homero Fellers, MD 08/14/2020, 9:47 PM

## 2020-08-15 DIAGNOSIS — U071 COVID-19: Secondary | ICD-10-CM | POA: Diagnosis not present

## 2020-08-15 DIAGNOSIS — Z3A24 24 weeks gestation of pregnancy: Secondary | ICD-10-CM | POA: Diagnosis not present

## 2020-08-15 DIAGNOSIS — O26892 Other specified pregnancy related conditions, second trimester: Secondary | ICD-10-CM | POA: Diagnosis not present

## 2020-08-15 DIAGNOSIS — O98512 Other viral diseases complicating pregnancy, second trimester: Secondary | ICD-10-CM | POA: Diagnosis not present

## 2020-08-15 DIAGNOSIS — R109 Unspecified abdominal pain: Secondary | ICD-10-CM | POA: Diagnosis not present

## 2020-08-15 DIAGNOSIS — O98513 Other viral diseases complicating pregnancy, third trimester: Secondary | ICD-10-CM | POA: Diagnosis not present

## 2020-08-15 LAB — KLEIHAUER-BETKE STAIN
Fetal Cells %: 0 %
Quantitation Fetal Hemoglobin: 0 mL

## 2020-08-15 NOTE — OB Triage Note (Signed)
Patient reports that she feels better and agrees that she is ready to go home. Discussed plan of care with patient- verbalized understanding and agreed. Plans to continue with mylicon at home.

## 2020-08-15 NOTE — Discharge Instructions (Signed)
Continue taking mylicon (simethicone) and tylenol as needed following the instructions on the package. Use pregnancy support belt for comfort/ round ligament pain as needed. Please call the office on Monday to schedule an appointment for after January 11th, 2022. Return to the hospital for contractions, leaking of fluid like your water broke, vaginal bleeding, or decreased fetal movement.

## 2020-08-15 NOTE — Discharge Summary (Signed)
Physician Discharge Summary  Patient ID: Caitlin Ortega MRN: [id number] DOB/AGE: 11/19/1985 35 y.o.  Admit date: 08/14/2020 Discharge date: 08/15/2020  Admission Diagnoses:  Discharge Diagnoses:  Active Problems:   Abdominal pain in pregnancy, second trimester   Discharged Condition: good  Hospital Course: Patient was admitted for evaluation of left lower abdominal pain in pregnancy. Patient was given IV fluids for hydration, tylenol and mylicon. Her pain improved.  She was able to be discharged home. Fetal evaluation was reassuring. Labs were WNL>   Consults: None  Significant Diagnostic Studies: labs: See EPIC  Treatments: IV hydration  Discharge Exam: Blood pressure 103/63, pulse 87, temperature 98.2 F (36.8 C), resp. rate (!) 22, height '5\' 5"'  (1.651 m), weight 93.9 kg, last menstrual period 02/22/2020, SpO2 97 %. General appearance: alert, cooperative and appears stated age Resp: clear to auscultation bilaterally Cardio: regular rate and rhythm, S1, S2 normal, no murmur, click, rub or gallop GI: soft, non-tender; bowel sounds normal; no masses,  no organomegaly Extremities: extremities normal, atraumatic, no cyanosis or edema Skin: Skin color, texture, turgor normal. No rashes or lesions  Disposition: Discharge disposition: 01-Home or Self Care        Allergies as of 08/15/2020   No Known Allergies     Medication List    TAKE these medications   Accu-Chek Nano SmartView w/Device Kit Use as directed   Accu-Chek SmartView test strip Generic drug: glucose blood Use as instructed   Accu-Chek Softclix Lancets lancets Please monitor your blood glucose in the morning, prior to eating, and two hours after eating your meals throughout the day. You will receive further instruction at you nutrition and diabetes education appointment.   albuterol 108 (90 Base) MCG/ACT inhaler Commonly known as: VENTOLIN HFA Inhale 2 puffs into the lungs every 6 (six) hours as needed  for wheezing or shortness of breath.   blood glucose meter kit and supplies Kit Dispense based on patient and insurance preference. Use up to four times daily as directed. Please monitor blood glucose in the morning before breakfast and two hours after each meal during the day. You will receive further information on monitoring at your nutrition and diabetic education appointment.   multivitamin with minerals tablet Take 1 tablet by mouth daily.   Prenatal + Complete Multi 0.267 & 373 MG Thpk Take 1 tablet by mouth daily.       Follow-up Information    [facility]. Schedule an appointment as soon as possible for a visit in 1 week(s).   Contact information: [address] [place] [address] [phone]              Signed: Homero Fellers 08/15/2020, 1:04 AM

## 2020-08-17 DIAGNOSIS — F411 Generalized anxiety disorder: Secondary | ICD-10-CM | POA: Diagnosis not present

## 2020-08-17 DIAGNOSIS — F331 Major depressive disorder, recurrent, moderate: Secondary | ICD-10-CM | POA: Diagnosis not present

## 2020-08-20 ENCOUNTER — Telehealth: Payer: Self-pay

## 2020-08-20 NOTE — Telephone Encounter (Signed)
Spoke w/patient. She received monitor from [employer]. It's the guide. She needs the guide test strips and soft clix lancets. Advised pt rx for these were sent 07/24/20 and 07/27/20. States she never received them. I will contact pharamcy to verify rx.

## 2020-08-20 NOTE — Telephone Encounter (Signed)
Spoke w/pharmacy. They have the soft clix rx on hold. Test strips was for smartview which has been discontinued. They have been sending requests to change this to guide test strips. Verbal given ok to change to guide to fit patient monitor. They will fill rx's.

## 2020-08-20 NOTE — Telephone Encounter (Signed)
Patient needs glucose test strips (guide soft clix) rx sent to [facility]. [phone]

## 2020-08-20 NOTE — Telephone Encounter (Signed)
Patient aware.

## 2020-08-24 ENCOUNTER — Ambulatory Visit: Payer: Medicaid Other | Admitting: Dietician

## 2020-08-24 DIAGNOSIS — F331 Major depressive disorder, recurrent, moderate: Secondary | ICD-10-CM | POA: Diagnosis not present

## 2020-08-24 DIAGNOSIS — F411 Generalized anxiety disorder: Secondary | ICD-10-CM | POA: Diagnosis not present

## 2020-08-26 ENCOUNTER — Encounter: Payer: Self-pay | Admitting: Dietician

## 2020-08-26 ENCOUNTER — Other Ambulatory Visit: Payer: Self-pay

## 2020-08-26 ENCOUNTER — Encounter: Payer: Medicaid Other | Attending: Obstetrics and Gynecology | Admitting: Dietician

## 2020-08-26 VITALS — BP 106/64 | Ht 65.0 in | Wt 207.4 lb

## 2020-08-26 DIAGNOSIS — O24410 Gestational diabetes mellitus in pregnancy, diet controlled: Secondary | ICD-10-CM | POA: Diagnosis not present

## 2020-08-26 DIAGNOSIS — Z3A00 Weeks of gestation of pregnancy not specified: Secondary | ICD-10-CM | POA: Insufficient documentation

## 2020-08-26 NOTE — Patient Instructions (Signed)
   Use nuts, guacamole, peanut butter to help with feeling full and less hungry during the day.   Work on keeping snacks about 2 hours apart to decrease the effect on blood sugar.   Continue to drink plenty of water and limit sugar sweetened drinks.   Great job increasing veggies and fruits and controlling most starchy foods! Keep up the awesome work!

## 2020-08-26 NOTE — Progress Notes (Signed)
.   Patient's BG results (given orally) indicate fasting BGs ranging 89-123, and post-meal BGs ranging 120-148 . Patient's food recall indicates likely some inconsistent carb intake, with occasional sugar sweetened beverages and high carb meals (ie footlong sub with chips). She has made significant positive changes to reduce sugar and carb intake and increase lean proteins and low carb vegetables.  . Patient reports feeling hungry frequently and hunger is sometimes not satisfied after meals. Her energy and carb needs might be higher during the day if her job is very active.  . Provided balanced meal plan, and wrote individualized menus based on patient's food preferences. . Instructed patient on food safety, including avoidance of Listeriosis, and limiting mercury from fish. . Discussed importance of maintaining healthy lifestyle habits to reduce risk of Type 2 DM as well as Gestational DM with any future pregnancies. . Advised patient to use any remaining testing supplies to test some BGs after delivery, and to have BG tested ideally annually, as well as prior to attempting future pregnancies.

## 2020-09-09 DIAGNOSIS — F411 Generalized anxiety disorder: Secondary | ICD-10-CM | POA: Diagnosis not present

## 2020-09-09 DIAGNOSIS — F331 Major depressive disorder, recurrent, moderate: Secondary | ICD-10-CM | POA: Diagnosis not present

## 2020-09-11 ENCOUNTER — Ambulatory Visit (INDEPENDENT_AMBULATORY_CARE_PROVIDER_SITE_OTHER): Payer: Medicaid Other | Admitting: Obstetrics and Gynecology

## 2020-09-11 ENCOUNTER — Encounter: Payer: Self-pay | Admitting: Obstetrics and Gynecology

## 2020-09-11 ENCOUNTER — Other Ambulatory Visit: Payer: Self-pay

## 2020-09-11 VITALS — BP 118/70 | Ht 65.0 in | Wt 213.8 lb

## 2020-09-11 DIAGNOSIS — O99512 Diseases of the respiratory system complicating pregnancy, second trimester: Secondary | ICD-10-CM

## 2020-09-11 DIAGNOSIS — O24410 Gestational diabetes mellitus in pregnancy, diet controlled: Secondary | ICD-10-CM

## 2020-09-11 DIAGNOSIS — J45909 Unspecified asthma, uncomplicated: Secondary | ICD-10-CM

## 2020-09-11 DIAGNOSIS — O0990 Supervision of high risk pregnancy, unspecified, unspecified trimester: Secondary | ICD-10-CM

## 2020-09-11 DIAGNOSIS — O24419 Gestational diabetes mellitus in pregnancy, unspecified control: Secondary | ICD-10-CM

## 2020-09-11 DIAGNOSIS — O99212 Obesity complicating pregnancy, second trimester: Secondary | ICD-10-CM

## 2020-09-11 DIAGNOSIS — Z3A28 28 weeks gestation of pregnancy: Secondary | ICD-10-CM

## 2020-09-11 DIAGNOSIS — O99810 Abnormal glucose complicating pregnancy: Secondary | ICD-10-CM

## 2020-09-11 LAB — GLUCOSE, POCT (MANUAL RESULT ENTRY): POC Glucose: 129 mg/dl — AB (ref 70–99)

## 2020-09-11 NOTE — Progress Notes (Signed)
poc

## 2020-09-11 NOTE — Progress Notes (Signed)
Routine Prenatal Care Visit  Subjective  Caitlin Ortega is a 35 y.o. G2P1 at [redacted]w[redacted]d being seen today for ongoing prenatal care.  She is currently monitored for the following issues for this high-risk pregnancy and has Supervision of high risk pregnancy, antepartum; Obesity affecting pregnancy; Abnormal glucose tolerance test (GTT) during pregnancy, antepartum; Asthma affecting pregnancy in second trimester; Gestational diabetes mellitus (GDM) in second trimester; and Abdominal pain in pregnancy, second trimester on their problem list.  ----------------------------------------------------------------------------------- Patient reports no complaints.   Contractions: Not present. Vag. Bleeding: None.  Movement: Present. Denies leaking of fluid.  ----------------------------------------------------------------------------------- The following portions of the patient's history were reviewed and updated as appropriate: allergies, current medications, past family history, past medical history, past social history, past surgical history and problem list. Problem list updated.   Objective  Blood pressure 118/70, height 5\' 5"  (1.651 m), weight 213 lb 12.8 oz (97 kg), last menstrual period 02/22/2020. Pregravid weight 189 lb (85.7 kg) Total Weight Gain 24 lb 12.8 oz (11.2 kg) Urinalysis:      Fetal Status: Fetal Heart Rate (bpm): 150 Fundal Height: 30 cm Movement: Present     General:  Alert, oriented and cooperative. Patient is in no acute distress.  Skin: Skin is warm and dry. No rash noted.   Cardiovascular: Normal heart rate noted  Respiratory: Normal respiratory effort, no problems with respiration noted  Abdomen: Soft, gravid, appropriate for gestational age. Pain/Pressure: Absent     Pelvic:  Cervical exam deferred        Extremities: Normal range of motion.  Edema: None  Mental Status: Normal mood and affect. Normal behavior. Normal judgment and thought content.     Assessment   35  y.o. G2P1 at [redacted]w[redacted]d by  11/28/2020, by Last Menstrual Period presenting for routine prenatal visit  Plan   pregnancy Problems (from 05/04/20 to present)    Problem Noted Resolved   Gestational diabetes mellitus (GDM) in second trimester 07/27/2020 by Homero Fellers, MD No   Abnormal glucose tolerance test (GTT) during pregnancy, antepartum 07/07/2020 by Will Bonnet, MD No   Overview Addendum 07/27/2020 12:16 PM by Homero Fellers, MD    - failed early 1h gtt (141) [x ] needs 3h gtt- elevated      Previous Version   Supervision of high risk pregnancy, antepartum 05/04/2020 by Will Bonnet, MD No   Overview Addendum 09/11/2020  4:04 PM by Homero Fellers, MD     Nursing Staff Provider  Office Location  [facility] Dating   LMP = 10 wk [place]  Language  English Anatomy US  Complete  Flu Vaccine  Declines Genetic Screen   None   TDaP vaccine   reports she had at work 05/2020 Hgb A1C or  GTT Early : elevated 3hr GTT at 20 weeks  Rhogam  Not needed   LAB RESULTS   Feeding Plan  Blood Type O/Positive/-- (11/09 1049)   Contraception  Antibody Negative (11/09 1049)  Circumcision  Rubella 2.41 (11/09 1049)  Pediatrician   RPR Non Reactive (11/09 1049)   Support Person  HBsAg Negative (11/09 1049)   Prenatal Classes  HIV Non Reactive (11/09 1049)    Varicella Non-immune  BTL Consent  GBS  (For PCN allergy, check sensitivities)        VBAC Consent  Pap  2020 NIL     Hgb Electro    Covid Had in Jan 2022 CF      SMA  High Risk Pregnancy Diagnoses Gestational Diabetes- diet controlled Obesity in pregnancy History of Asthma  Antenatal testing: only if medications for management of diabetes is needed Growth [place]: every 4 weeks in third trimester      Previous Version   Obesity affecting pregnancy 05/04/2020 by Will Bonnet, MD No   Overview Addendum 07/22/2020 11:40 AM by Orlie Pollen, CNM    BMI >=35.0-39.9 [x]  early 1h gtt - 141 - 3h gtt on  07/22/20 [x]  u/s for dating  [ ]  nutritional goals [ ]  folic acid 1mg  [ ]  bASA (>12 weeks) [ ]  consider nutrition consult [ ]  consider maternal EKG 1st trimester [ ]  Growth u/s 28 [ ] , 32 [ ] , 36 weeks [ ]  [ ]  NST/AFI weekly 37+ weeks (37[] , 38[] , 39[] , 40[] ) [ ]  IOL by 41 weeks (scheduled, prn [] )       Previous Version       Did not have glucose log. Reports that glucose values have improved form 140 after meals to the 120s. She has made significant dietary changes. Encouraged to bring glucose log to next visit.  Random glucose today 129- reports she ate lunch 1 hour ago.  Decline TDAP today, reports she received earlier this pregnancy at work.   Gestational age appropriate obstetric precautions including but not limited to vaginal bleeding, contractions, leaking of fluid and fetal movement were reviewed in detail with the patient.    Return in about 2 weeks (around 09/25/2020) for [place] with MD and growth [place].  Homero Fellers MD [facility], [facility] 09/11/2020, 4:04 PM

## 2020-09-11 NOTE — Patient Instructions (Addendum)
https://www.nichd.nih.gov/health/topics/labor-delivery/Pages/default.aspx">  Third Trimester of Pregnancy  The third trimester of pregnancy is from week 28 through week 40. This is months 7 through 9. The third trimester is a time when the unborn baby (fetus) is growing rapidly. At the end of the ninth month, the fetus is about 20 inches long and weighs 6-10 pounds. Body changes during your third trimester During the third trimester, your body will continue to go through many changes. The changes vary and generally return to normal after your baby is born. Physical changes  Your weight will continue to increase. You can expect to gain 25-35 pounds (11-16 kg) by the end of the pregnancy if you begin pregnancy at a normal weight. If you are underweight, you can expect to gain 28-40 lb (about 13-18 kg), and if you are overweight, you can expect to gain 15-25 lb (about 7-11 kg).  You may begin to get stretch marks on your hips, abdomen, and breasts.  Your breasts will continue to grow and may hurt. A yellow fluid (colostrum) may leak from your breasts. This is the first milk you are producing for your baby.  You may have changes in your hair. These can include thickening of your hair, rapid growth, and changes in texture. Some people also have hair loss during or after pregnancy, or hair that feels dry or thin.  Your belly button may stick out.  You may notice more swelling in your hands, face, or ankles. Health changes  You may have heartburn.  You may have constipation.  You may develop hemorrhoids.  You may develop swollen, bulging veins (varicose veins) in your legs.  You may have increased body aches in the pelvis, back, or thighs. This is due to weight gain and increased hormones that are relaxing your joints.  You may have increased tingling or numbness in your hands, arms, and legs. The skin on your abdomen may also feel numb.  You may feel short of breath because of your  expanding uterus. Other changes  You may urinate more often because the fetus is moving lower into your pelvis and pressing on your bladder.  You may have more problems sleeping. This may be caused by the size of your abdomen, an increased need to urinate, and an increase in your body's metabolism.  You may notice the fetus "dropping," or moving lower in your abdomen (lightening).  You may have increased vaginal discharge.  You may notice that you have pain around your pelvic bone as your uterus distends. Follow these instructions at home: Medicines  Follow your health care provider's instructions regarding medicine use. Specific medicines may be either safe or unsafe to take during pregnancy. Do not take any medicines unless approved by your health care provider.  Take a prenatal vitamin that contains at least 600 micrograms (mcg) of folic acid. Eating and drinking  Eat a healthy diet that includes fresh fruits and vegetables, whole grains, good sources of protein such as meat, eggs, or tofu, and low-fat dairy products.  Avoid raw meat and unpasteurized juice, milk, and cheese. These carry germs that can harm you and your baby.  Eat 4 or 5 small meals rather than 3 large meals a day.  You may need to take these actions to prevent or treat constipation: ? Drink enough fluid to keep your urine pale yellow. ? Eat foods that are high in fiber, such as beans, whole grains, and fresh fruits and vegetables. ? Limit foods that are high in fat and   processed sugars, such as fried or sweet foods. Activity  Exercise only as directed by your health care provider. Most people can continue their usual exercise routine during pregnancy. Try to exercise for 30 minutes at least 5 days a week. Stop exercising if you experience contractions in the uterus.  Stop exercising if you develop pain or cramping in the lower abdomen or lower back.  Avoid heavy lifting.  Do not exercise if it is very hot or  humid or if you are at a high altitude.  If you choose to, you may continue to have sex unless your health care provider tells you not to. Relieving pain and discomfort  Take frequent breaks and rest with your legs raised (elevated) if you have leg cramps or low back pain.  Take warm sitz baths to soothe any pain or discomfort caused by hemorrhoids. Use hemorrhoid cream if your health care provider approves.  Wear a supportive bra to prevent discomfort from breast tenderness.  If you develop varicose veins: ? Wear support hose as told by your health care provider. ? Elevate your feet for 15 minutes, 3-4 times a day. ? Limit salt in your diet. Safety  Talk to your health care provider before traveling far distances.  Do not use hot tubs, steam rooms, or saunas.  Wear your seat belt at all times when driving or riding in a car.  Talk with your health care provider if someone is verbally or physically abusive to you. Preparing for birth To prepare for the arrival of your baby:  Take prenatal classes to understand, practice, and ask questions about labor and delivery.  Visit the hospital and tour the maternity area.  Purchase a rear-facing car seat and make sure you know how to install it in your car.  Prepare the baby's room or sleeping area. Make sure to remove all pillows and stuffed animals from the baby's crib to prevent suffocation. General instructions  Avoid cat litter boxes and soil used by cats. These carry germs that can cause birth defects in the baby. If you have a cat, ask someone to clean the litter box for you.  Do not douche or use tampons. Do not use scented sanitary pads.  Do not use any products that contain nicotine or tobacco, such as cigarettes, e-cigarettes, and chewing tobacco. If you need help quitting, ask your health care provider.  Do not use any herbal remedies, illegal drugs, or medicines that were not prescribed to you. Chemicals in these products  can harm your baby.  Do not drink alcohol.  You will have more frequent prenatal exams during the third trimester. During a routine prenatal visit, your health care provider will do a physical exam, perform tests, and discuss your overall health. Keep all follow-up visits. This is important. Where to find more information  American Pregnancy Association: americanpregnancy.org  American College of Obstetricians and Gynecologists: acog.org/en/Womens%20Health/Pregnancy  Office on Women's Health: womenshealth.gov/pregnancy Contact a health care provider if you have:  A fever.  Mild pelvic cramps, pelvic pressure, or nagging pain in your abdominal area or lower back.  Vomiting or diarrhea.  Bad-smelling vaginal discharge or foul-smelling urine.  Pain when you urinate.  A headache that does not go away when you take medicine.  Visual changes or see spots in front of your eyes. Get help right away if:  Your water breaks.  You have regular contractions less than 5 minutes apart.  You have spotting or bleeding from your vagina.  You   have severe abdominal pain.  You have difficulty breathing.  You have chest pain.  You have fainting spells.  You have not felt your baby move for the time period told by your health care provider.  You have new or increased pain, swelling, or redness in an arm or leg. Summary  The third trimester of pregnancy is from week 28 through week 40 (months 7 through 9).  You may have more problems sleeping. This can be caused by the size of your abdomen, an increased need to urinate, and an increase in your body's metabolism.  You will have more frequent prenatal exams during the third trimester. Keep all follow-up visits. This is important. This information is not intended to replace advice given to you by your health care provider. Make sure you discuss any questions you have with your health care provider. Document Revised: 01/01/2020 Document  Reviewed: 11/07/2019 Elsevier Patient Education  2021 [place].   Gestational Diabetes Mellitus, Self-Care Caring for yourself after a diagnosis of gestational diabetes mellitus means keeping your blood sugar under control. This can be done through nutrition, exercise, lifestyle changes, insulin and other medicines, and support from your health care team. Your health care provider will set individualized treatment goals for you. What are the risks? If left untreated, gestational diabetes can cause problems for mother and baby. For the mother Women who get gestational diabetes are more likely to:  Have labor induced and deliver early.  Have problems during labor and delivery, if the baby is larger than normal. This includes difficult labor and damage to the birth canal.  Have a cesarean delivery.  Have problems with blood pressure, including high blood pressure and preeclampsia.  Get it again if they become pregnant.  Develop type 2 diabetes in the future. For the baby Gestational diabetes that is not treated can cause the baby to have:  Low blood glucose (hypoglycemia).  Larger-than-normal body size (macrosomia).  Breathing problems. How to monitor blood glucose Check your blood glucose every day and as often as told by your health care provider. To do this: 1. Wash your hands with soap and water for at least 20 seconds. 2. Prick the side of your finger (not the tip) with the lancet. Use a different finger each time. 3. Gently rub the finger until a small drop of blood appears. 4. Follow instructions that come with your meter for inserting the test strip, applying blood to the strip, and getting the result. 5. Write down your result and any notes. Blood glucose goals are:  95 mg/dL (5.3 mmol/L) when fasting.  140 mg/dL (7.8 mmol/L) 1 hour after a meal.  120 mg/dL (6.7 mmol/L) 2 hours after a meal.   Follow these instructions at home: Medicines  Take  over-the-counter and prescription medicines only as told by your health care provider.  If your health care provider prescribed insulin or other diabetes medicines: ? Take them every day. ? Do not run out of insulin or other medicines. Plan ahead so you always have them available. Eating and drinking  Follow instructions from your health care provider about eating or drinking restrictions.  See a diet and nutrition expert (registered dietician) to help you create an eating plan that helps control your diabetes. The foods in this plan will include: ? Lean proteins. ? Complex carbohydrates. These are carbohydrates that contain fiber, have a lot of nutrients, and are digested slowly. They include dried beans, nuts, and whole grain breads, cereals, or pasta. ?  Fresh fruits and vegetables. ? Low-fat dairy products. ? Healthy fats.  Eat healthy snacks between nutritious meals.  Drink enough fluid to keep your urine pale yellow.  Keep a record of the carbohydrates that you eat. To do this: ? Read food labels. ? Learn the standard serving sizes of foods.  Make a sick day plan with your health care provider before you get sick. Follow this plan whenever you cannot eat or drink as usual.   Activity  Do exercises as told by your health care provider.  Do 30 or more minutes of physical activity a day, or as much physical activity as your health care provider recommends. It may help to control blood glucose levels after a meal if you: ? Do 10 minutes of exercise after each meal. ? Start this exercise 30 minutes after the meal.  If you start a new exercise or activity, work with your health care provider to adjust your insulin, other medicines, or food as needed. Lifestyle  Do not drink alcohol.  Do not use any products that contain nicotine or tobacco, such as cigarettes, e-cigarettes, and chewing tobacco. If you need help quitting, ask your health care provider.  Learn to manage stress.  If you need help with this, ask your health care provider. Body care  Keep your vaccines up to date.  Practice good oral hygiene. To do this: ? Clean your teeth and gums two times a day. ? Floss one or more times a day. ? Visit your dentist one or more times every 6 months.  Stay at a healthy weight while you are pregnant. Your expected weight gain depends on your BMI (body mass index) before pregnancy. General instructions  Talk with your health care provider about the risk for high blood pressure during pregnancy (preeclampsia and eclampsia).  Share your diabetes management plan with people in your workplace, school, and household.  Check your urine for ketones when sick and as told by your health care provider. Ketones are made by the liver when a lack of glucose forces the body to use fat for energy.  Carry a medical alert card or wear medical alert jewelry that says you have gestational diabetes.  Keep all follow-up visits. This is important. Get care after delivery  Have your blood glucose level checked with an oral glucose tolerance test (OGTT) 4-12 weeks after delivery.  Get screened for diabetes at least every 3 years, or as often as told by your health care provider. Where to find more information  American Diabetes Association (ADA): diabetes.org  Association of Diabetes Care & Education Specialists (ADCES): diabeteseducator.org  Centers for Disease Control and Prevention (CDC): StoreMirror.com.cy  American Pregnancy Association: americanpregnancy.org  [place]. Department of Agriculture MyPlate: http://www.wilson-mendoza.org/ Contact a health care provider if:  Your blood glucose is above your target for two tests in a row.  You have a fever.  You have been sick for 2 days or more and are not getting better.  You have either of these problems for more than 6 hours: ? Vomiting every time you eat or drink. ? Diarrhea. Get help right away if you:  Become confused or cannot think  clearly.  Have trouble breathing.  Have moderate or high ketones in your urine.  Feel your baby is not moving as usual.  Develop unusual discharge or bleeding from your vagina.  Start having early (premature) contractions. Contractions may feel like a tightening in your lower abdomen  Have a severe headache. These symptoms may  represent a serious problem that is an emergency. Do not wait to see if the symptoms will go away. Get medical help right away. Call your local emergency services (911 in the [place] Do not drive yourself to the hospital. Summary  Check your blood glucose every day during your pregnancy. Do this as often as told by your health care provider.  Take insulin or other diabetes medicines every day, if your health care provider prescribed them.  Have your blood glucose level checked 4-12 weeks after delivery.  Keep all follow-up visits. This is important. This information is not intended to replace advice given to you by your health care provider. Make sure you discuss any questions you have with your health care provider. Document Revised: 12/30/2019 Document Reviewed: 12/30/2019 Elsevier Patient Education  [place].

## 2020-09-25 ENCOUNTER — Encounter: Payer: Medicaid Other | Admitting: Obstetrics and Gynecology

## 2020-09-25 ENCOUNTER — Ambulatory Visit: Payer: Medicaid Other

## 2020-09-25 ENCOUNTER — Encounter: Payer: Medicaid Other | Admitting: Obstetrics & Gynecology

## 2020-09-25 DIAGNOSIS — O0990 Supervision of high risk pregnancy, unspecified, unspecified trimester: Secondary | ICD-10-CM

## 2020-09-25 DIAGNOSIS — O99212 Obesity complicating pregnancy, second trimester: Secondary | ICD-10-CM

## 2020-09-25 DIAGNOSIS — O24419 Gestational diabetes mellitus in pregnancy, unspecified control: Secondary | ICD-10-CM

## 2020-09-28 ENCOUNTER — Ambulatory Visit: Payer: Medicaid Other

## 2020-09-29 ENCOUNTER — Encounter: Payer: Medicaid Other | Admitting: Obstetrics & Gynecology

## 2020-09-30 ENCOUNTER — Other Ambulatory Visit: Payer: Self-pay

## 2020-09-30 ENCOUNTER — Ambulatory Visit (INDEPENDENT_AMBULATORY_CARE_PROVIDER_SITE_OTHER): Payer: Medicaid Other | Admitting: Obstetrics and Gynecology

## 2020-09-30 ENCOUNTER — Ambulatory Visit (INDEPENDENT_AMBULATORY_CARE_PROVIDER_SITE_OTHER): Payer: Medicaid Other

## 2020-09-30 ENCOUNTER — Encounter: Payer: Self-pay | Admitting: Obstetrics and Gynecology

## 2020-09-30 VITALS — BP 118/72 | Wt 218.2 lb

## 2020-09-30 DIAGNOSIS — O0990 Supervision of high risk pregnancy, unspecified, unspecified trimester: Secondary | ICD-10-CM | POA: Diagnosis not present

## 2020-09-30 DIAGNOSIS — O24419 Gestational diabetes mellitus in pregnancy, unspecified control: Secondary | ICD-10-CM

## 2020-09-30 DIAGNOSIS — O99212 Obesity complicating pregnancy, second trimester: Secondary | ICD-10-CM | POA: Diagnosis not present

## 2020-09-30 DIAGNOSIS — Z3A31 31 weeks gestation of pregnancy: Secondary | ICD-10-CM

## 2020-09-30 NOTE — Progress Notes (Signed)
Routine Prenatal Care Visit  Subjective  Caitlin Ortega is a 35 y.o. G2P1 at 105w4d being seen today for ongoing prenatal care.  She is currently monitored for the following issues for this high-risk pregnancy and has Supervision of high risk pregnancy, antepartum; Obesity affecting pregnancy; Abnormal glucose tolerance test (GTT) during pregnancy, antepartum; Asthma affecting pregnancy in second trimester; Gestational diabetes mellitus (GDM) in second trimester; and Abdominal pain in pregnancy, second trimester on their problem list.  ----------------------------------------------------------------------------------- Patient reports worsening shortness of breath.  She has a history of asthma. She has been using a friend's inhaler for albuterol but has not noticed significant improvement in her symptoms.  Contractions: Not present. Vag. Bleeding: None.  Movement: Present. Denies leaking of fluid.  ----------------------------------------------------------------------------------- The following portions of the patient's history were reviewed and updated as appropriate: allergies, current medications, past family history, past medical history, past social history, past surgical history and problem list. Problem list updated.   Objective  Blood pressure 118/72, weight 218 lb 3.2 oz (99 kg), last menstrual period 02/22/2020. Pregravid weight 189 lb (85.7 kg) Total Weight Gain 29 lb 3.2 oz (13.2 kg) Urinalysis:      Fetal Status: Fetal Heart Rate (bpm): 135   Movement: Present     General:  Alert, oriented and cooperative. Patient is in no acute distress.  Skin: Skin is warm and dry. No rash noted.   Cardiovascular: Normal heart rate noted  Respiratory: Normal respiratory effort, no problems with respiration noted  Abdomen: Soft, gravid, appropriate for gestational age. Pain/Pressure: Absent     Pelvic:  Cervical exam deferred        Extremities: Normal range of motion.  Edema: Trace   Mental Status: Normal mood and affect. Normal behavior. Normal judgment and thought content.    Assessment   35 y.o. G2P1 at [redacted]w[redacted]d by  11/28/2020, by Last Menstrual Period presenting for routine prenatal visit  Plan   pregnancy Problems (from 05/04/20 to present)    Problem Noted Resolved   Gestational diabetes mellitus (GDM) in second trimester 07/27/2020 by Homero Fellers, MD No   Abnormal glucose tolerance test (GTT) during pregnancy, antepartum 07/07/2020 by Will Bonnet, MD No   Overview Addendum 07/27/2020 12:16 PM by Homero Fellers, MD    - failed early 1h gtt (141) [x ] needs 3h gtt- elevated      Previous Version   Supervision of high risk pregnancy, antepartum 05/04/2020 by Will Bonnet, MD No   Overview Addendum 09/11/2020  4:04 PM by Homero Fellers, MD     Nursing Staff Provider  Office Location  [facility] Dating   LMP = 10 wk [place]  Language  English Anatomy US  Complete  Flu Vaccine  Declines Genetic Screen   None   TDaP vaccine   reports she had at work 05/2020 Hgb A1C or  GTT Early : elevated 3hr GTT at 20 weeks  Rhogam  Not needed   LAB RESULTS   Feeding Plan  Blood Type O/Positive/-- (11/09 1049)   Contraception  Antibody Negative (11/09 1049)  Circumcision  Rubella 2.41 (11/09 1049)  Pediatrician   RPR Non Reactive (11/09 1049)   Support Person  HBsAg Negative (11/09 1049)   Prenatal Classes  HIV Non Reactive (11/09 1049)    Varicella Non-immune  BTL Consent  GBS  (For PCN allergy, check sensitivities)        VBAC Consent  Pap  2020 NIL  Hgb Electro    Covid Had in Jan 2022 CF      SMA         High Risk Pregnancy Diagnoses Gestational Diabetes- diet controlled Obesity in pregnancy History of Asthma  Antenatal testing: only if medications for management of diabetes is needed Growth [place]: every 4 weeks in third trimester      Previous Version   Obesity affecting pregnancy 05/04/2020 by Will Bonnet, MD No    Overview Addendum 07/22/2020 11:40 AM by Orlie Pollen, CNM    BMI >=35.0-39.9 [x]  early 1h gtt - 141 - 3h gtt on 07/22/20 [x]  u/s for dating  [ ]  nutritional goals [ ]  folic acid 1mg  [ ]  bASA (>12 weeks) [ ]  consider nutrition consult [ ]  consider maternal EKG 1st trimester [ ]  Growth u/s 28 [ ] , 32 [ ] , 36 weeks [ ]  [ ]  NST/AFI weekly 37+ weeks (37[] , 38[] , 39[] , 40[] ) [ ]  IOL by 41 weeks (scheduled, prn [] )       Previous Version       How frequently are you having symptoms of asthma? Two days per week or less More than two days per week Daily Throughout the day  How frequently do you have nighttime awakenings? Twice per month or less More than twice per month More than once per week Four times per week or more  How much does your asthma interfere with normal activity? None Minor limitation Some limitation Extremely limited    Patient ambulated in hallway. Maintained SpO2 above 97.  Reports symptoms of severe persistent asthma though.   Patient again encouraged to bring glucose log. Issue with office glucometer battery and could not get random glucose today. Patient's infant is measuring ahead.   Asked patient to consider coming to the hospital for evaluation of SOB and to monitor glucose. Infant in measuring ahead and concern that she may need medication to help with glucose control. Would also likely benefit from speaking with diabetic coordinator in hospital. She said she would be able to come tomorrow and I encouraged her to do so in the morning.    Gestational age appropriate obstetric precautions including but not limited to vaginal bleeding, contractions, leaking of fluid and fetal movement were reviewed in detail with the patient.    Return in about 1 week (around 10/07/2020) for [place] in [place] with MD and NST.  Homero Fellers MD [facility], [facility] 09/30/2020, 5:35 PM

## 2020-09-30 NOTE — Patient Instructions (Signed)
How frequently are you having symptoms of asthma? Two days per week or less More than two days per week Daily Throughout the day  How frequently do you have nighttime awakenings? Twice per month or less More than twice per month More than once per week Four times per week or more  How much does your asthma interfere with normal activity? None Minor limitation Some limitation Extremely limited

## 2020-10-01 ENCOUNTER — Observation Stay: Payer: Medicaid Other

## 2020-10-01 ENCOUNTER — Encounter: Payer: Self-pay | Admitting: Obstetrics and Gynecology

## 2020-10-01 ENCOUNTER — Observation Stay
Admission: EM | Admit: 2020-10-01 | Discharge: 2020-10-02 | Disposition: A | Discharge status: Home / Self Care | Payer: Medicaid Other | Attending: Obstetrics and Gynecology | Admitting: Obstetrics and Gynecology

## 2020-10-01 DIAGNOSIS — R0602 Shortness of breath: Secondary | ICD-10-CM | POA: Diagnosis not present

## 2020-10-01 DIAGNOSIS — O24410 Gestational diabetes mellitus in pregnancy, diet controlled: Secondary | ICD-10-CM | POA: Diagnosis not present

## 2020-10-01 DIAGNOSIS — O99513 Diseases of the respiratory system complicating pregnancy, third trimester: Secondary | ICD-10-CM | POA: Insufficient documentation

## 2020-10-01 DIAGNOSIS — Z3490 Encounter for supervision of normal pregnancy, unspecified, unspecified trimester: Secondary | ICD-10-CM

## 2020-10-01 DIAGNOSIS — U071 COVID-19: Secondary | ICD-10-CM | POA: Diagnosis present

## 2020-10-01 DIAGNOSIS — J189 Pneumonia, unspecified organism: Secondary | ICD-10-CM | POA: Diagnosis not present

## 2020-10-01 DIAGNOSIS — J45909 Unspecified asthma, uncomplicated: Secondary | ICD-10-CM | POA: Diagnosis present

## 2020-10-01 DIAGNOSIS — D509 Iron deficiency anemia, unspecified: Secondary | ICD-10-CM | POA: Diagnosis present

## 2020-10-01 DIAGNOSIS — F331 Major depressive disorder, recurrent, moderate: Secondary | ICD-10-CM | POA: Diagnosis not present

## 2020-10-01 DIAGNOSIS — F411 Generalized anxiety disorder: Secondary | ICD-10-CM | POA: Diagnosis not present

## 2020-10-01 DIAGNOSIS — O24419 Gestational diabetes mellitus in pregnancy, unspecified control: Principal | ICD-10-CM | POA: Insufficient documentation

## 2020-10-01 DIAGNOSIS — O98513 Other viral diseases complicating pregnancy, third trimester: Secondary | ICD-10-CM | POA: Diagnosis present

## 2020-10-01 DIAGNOSIS — O26893 Other specified pregnancy related conditions, third trimester: Secondary | ICD-10-CM | POA: Diagnosis present

## 2020-10-01 DIAGNOSIS — Z3A31 31 weeks gestation of pregnancy: Secondary | ICD-10-CM | POA: Diagnosis not present

## 2020-10-01 DIAGNOSIS — O99891 Other specified diseases and conditions complicating pregnancy: Secondary | ICD-10-CM | POA: Diagnosis not present

## 2020-10-01 DIAGNOSIS — Z20822 Contact with and (suspected) exposure to covid-19: Secondary | ICD-10-CM | POA: Diagnosis not present

## 2020-10-01 LAB — FERRITIN: Ferritin: 6 ng/mL — ABNORMAL LOW (ref 11–307)

## 2020-10-01 LAB — FOLATE: Folate: 14.2 ng/mL (ref >5.9)

## 2020-10-01 LAB — RETICULOCYTES
Immature Retic Fract: 36.4 % — ABNORMAL HIGH (ref 2.3–15.9)
RBC.: 3.73 MIL/uL — ABNORMAL LOW (ref 3.87–5.11)
Retic Count, Absolute: 76.5 10*3/uL (ref 19.0–186.0)
Retic Ct Pct: 2.1 % (ref 0.4–3.1)

## 2020-10-01 LAB — CBC
HCT: 27 % — ABNORMAL LOW (ref 36.0–46.0)
Hemoglobin: 8.2 g/dL — ABNORMAL LOW (ref 12.0–15.0)
MCH: 23.2 pg — ABNORMAL LOW (ref 26.0–34.0)
MCHC: 30.4 g/dL (ref 30.0–36.0)
MCV: 76.5 fL — ABNORMAL LOW (ref 80.0–100.0)
Platelets: 238 10*3/uL (ref 150–400)
RBC: 3.53 MIL/uL — ABNORMAL LOW (ref 3.87–5.11)
RDW: 16 % — ABNORMAL HIGH (ref 11.5–15.5)
WBC: 9.1 10*3/uL (ref 4.0–10.5)
nRBC: 0 % (ref 0.0–0.2)

## 2020-10-01 LAB — BASIC METABOLIC PANEL
Anion gap: 9 (ref 5–15)
BUN: 8 mg/dL (ref 6–20)
CO2: 20 mmol/L — ABNORMAL LOW (ref 22–32)
Calcium: 8.6 mg/dL — ABNORMAL LOW (ref 8.9–10.3)
Chloride: 106 mmol/L (ref 98–111)
Creatinine, Ser: 0.57 mg/dL (ref 0.44–1.00)
GFR, Estimated: >60 mL/min (ref >60)
Glucose, Bld: 120 mg/dL — ABNORMAL HIGH (ref 70–99)
Potassium: 3.5 mmol/L (ref 3.5–5.1)
Sodium: 135 mmol/L (ref 135–145)

## 2020-10-01 LAB — IRON AND TIBC
Iron: 36 ug/dL (ref 28–170)
Saturation Ratios: 5 % — ABNORMAL LOW (ref 10.4–31.8)
TIBC: 689 ug/dL — ABNORMAL HIGH (ref 250–450)
UIBC: 653 ug/dL

## 2020-10-01 LAB — RESP PANEL BY RT-PCR (FLU A&B, COVID) ARPGX2
Influenza A by PCR: NEGATIVE
Influenza B by PCR: NEGATIVE
SARS Coronavirus 2 by RT PCR: NEGATIVE

## 2020-10-01 LAB — LIPASE, BLOOD: Lipase: 26 U/L (ref 11–51)

## 2020-10-01 LAB — VITAMIN B12: Vitamin B-12: 245 pg/mL (ref 180–914)

## 2020-10-01 LAB — D-DIMER, QUANTITATIVE: D-Dimer, Quant: 0.57 ug/mL-FEU — ABNORMAL HIGH (ref 0.00–0.50)

## 2020-10-01 LAB — TYPE AND SCREEN
ABO/RH(D): O POS
Antibody Screen: NEGATIVE

## 2020-10-01 LAB — GLUCOSE, CAPILLARY
Glucose-Capillary: 133 mg/dL — ABNORMAL HIGH (ref 70–99)
Glucose-Capillary: 113 mg/dL — ABNORMAL HIGH (ref 70–99)
Glucose-Capillary: 75 mg/dL (ref 70–99)

## 2020-10-01 MED ORDER — ALBUTEROL SULFATE HFA 108 (90 BASE) MCG/ACT IN AERS
2.0000 | INHALATION_SPRAY | RESPIRATORY_TRACT | Status: DC | PRN
  Filled 2020-10-01: qty 6.7

## 2020-10-01 MED ORDER — IPRATROPIUM BROMIDE HFA 17 MCG/ACT IN AERS
2.0000 | INHALATION_SPRAY | RESPIRATORY_TRACT | Status: DC
  Administered 2020-10-01 – 2020-10-02 (×2): 2 via RESPIRATORY_TRACT
  Filled 2020-10-01 (×2): qty 12.9

## 2020-10-01 MED ORDER — PRENATAL + COMPLETE MULTI 0.267 & 373 MG PO THPK: 1.0000 | PACK | Freq: Every day | ORAL | Status: DC

## 2020-10-01 MED ORDER — PRENATAL MULTIVITAMIN CH
1.0000 | ORAL_TABLET | Freq: Every day | ORAL | Status: DC
  Administered 2020-10-02: 1 via ORAL
  Filled 2020-10-01: qty 1

## 2020-10-01 MED ORDER — IOHEXOL 350 MG/ML SOLN
75.0000 mL | Freq: Once | INTRAVENOUS | Status: AC | PRN
  Administered 2020-10-01: 75 mL via INTRAVENOUS

## 2020-10-01 MED ORDER — DM-GUAIFENESIN ER 30-600 MG PO TB12
1.0000 | ORAL_TABLET | Freq: Two times a day (BID) | ORAL | Status: DC | PRN
  Filled 2020-10-01 (×2): qty 1

## 2020-10-01 NOTE — Consult Note (Addendum)
Medical Consultation   Caitlin Ortega  WUJ:[id number]  DOB: November 10, 1985  DOA: 10/01/2020  PCP: Valerie Roys, DO  Outpatient Specialists:    Requesting physician: -Dr. Gilman Schmidt  Reason for consultation: -SOB   History of Present Illness: Caitlin Ortega is an 35 y.o. female with a past medical history of asthma, gestational diabetes, allergy, COVID-19 infection 08/08/2020, pregnancy (G2P1 at 38w4), who is admitted by OB/GYN.  We are asked to consult due to shortness breath.  Patient states that she has been having mild shortness of breath for more than 1 month, which has worsened in the past several days.  She denies cough, chest pain, fever or chills.  Of note, patient had positive Covid test on 08/08/2020, but the repeated PCR tested today is negative.  Patient states that she has mild intermittent diarrhea, 2 episodes yesterday, no diarrhea today.  Denies abdominal pain, nausea, vomiting.  Denies symptoms of UTI.  No vaginal bleeding or discharge.  Lab and image: WBC 9.1, lipase 26, repeated Covid PCR negative, D-dimer 0.57, electrolytes renal function okay, CT angiogram is negative for PE.  Chest x-ray findings are consistent with recent COVID-19 infection.  Temperature normal, blood pressure 121/62, heart rate 89, RR 28, oxygen saturation 98% on room air.  CXR: Low lung volumes with asymmetric patchy and interstitial pulmonary opacity suspicious for COVID-19 pneumonia in this setting.  Review of Systems:   General: no fevers, chills, no changes in body weight, no changes in appetite Skin: no rash HEENT: no blurry vision, hearing changes or sore throat Pulm: has dyspnea, no coughing, wheezing CV: no chest pain, palpitations, shortness of breath Abd: no nausea/vomiting, abdominal pain, has diarrhea, no constipation GU: no dysuria, hematuria, polyuria Ext: no arthralgias, myalgias Neuro: no weakness, numbness, or tingling    Past Medical History: Past Medical  History:  Diagnosis Date  . Allergy   . Asthma   . Gestational diabetes     Past Surgical History: Past Surgical History:  Procedure Laterality Date  . NO PAST SURGERIES       Allergies:  No Known Allergies   Social History:  reports that she has quit smoking. She has never used smokeless tobacco. She reports previous alcohol use. She reports that she does not use drugs.   Family History: Family History  Problem Relation Age of Onset  . Hypertension Mother   . Hypertension Sister   . Heart disease Maternal Grandfather     Physical Exam: Vitals:   10/01/20 1230 10/01/20 1235 10/01/20 1240 10/01/20 1254  BP:    (!) 113/54  Pulse:    (!) 101  Resp:    (!) 28  Temp:    98.4 F (36.9 C)  TempSrc:    Oral  SpO2: 94% 95% 95%      General: Not in acute distress HEENT: PERRL, EOMI, no scleral icterus, No JVD or bruit Cardiac: S1/S2, RRR, No murmurs, gallops or rubs Pulm: Slightly decreased air movement bilaterally.  No rales, wheezing, rhonchi or rubs. Abd: Soft, nontender, no rebound pain, no organomegaly, BS present.  Abdominal size is consistent with pregancy weeks. Ext: No edema. 2+DP/PT pulse bilaterally Musculoskeletal: No joint deformities, erythema, or stiffness, ROM full Skin: No rashes.  Neuro: Alert and oriented X3, cranial nerves II-XII grossly intact Psych: Patient is not psychotic, no suicidal or hemocidal ideation.     Data reviewed:  I have personally reviewed  following labs and imaging studies Labs:  CBC: Recent Labs  Lab 10/01/20 0842  WBC 9.1  HGB 8.2*  HCT 27.0*  MCV 76.5*  PLT 409    Basic Metabolic Panel: Recent Labs  Lab 10/01/20 0842  NA 135  K 3.5  CL 106  CO2 20*  GLUCOSE 120*  BUN 8  CREATININE 0.57  CALCIUM 8.6*   GFR Estimated Creatinine Clearance: 117.6 mL/min (by C-G formula based on SCr of 0.57 mg/dL). Liver Function Tests: No results for input(s): AST, ALT, ALKPHOS, BILITOT, PROT, ALBUMIN in the last 168  hours. Recent Labs  Lab 10/01/20 0842  LIPASE 26   No results for input(s): AMMONIA in the last 168 hours. Coagulation profile No results for input(s): INR, PROTIME in the last 168 hours.  Cardiac Enzymes: No results for input(s): CKTOTAL, CKMB, CKMBINDEX, TROPONINI in the last 168 hours. BNP: Invalid input(s): POCBNP CBG: Recent Labs  Lab 10/01/20 0817 10/01/20 1251  GLUCAP 133* 113*   D-Dimer Recent Labs    10/01/20 0919  DDIMER 0.57*   Hgb A1c No results for input(s): HGBA1C in the last 72 hours. Lipid Profile No results for input(s): CHOL, HDL, LDLCALC, TRIG, CHOLHDL, LDLDIRECT in the last 72 hours. Thyroid function studies No results for input(s): TSH, T4TOTAL, T3FREE, THYROIDAB in the last 72 hours.  Invalid input(s): FREET3 Anemia work up No results for input(s): VITAMINB12, FOLATE, FERRITIN, TIBC, IRON, RETICCTPCT in the last 72 hours. Urinalysis    Component Value Date/Time   COLORURINE YELLOW (A) 08/14/2020 2107   APPEARANCEUR HAZY (A) 08/14/2020 2107   APPEARANCEUR Cloudy (A) 02/03/2020 1109   LABSPEC 1.017 08/14/2020 2107   PHURINE 6.0 08/14/2020 2107   GLUCOSEU NEGATIVE 08/14/2020 2107   HGBUR NEGATIVE 08/14/2020 2107   BILIRUBINUR NEGATIVE 08/14/2020 2107   BILIRUBINUR Negative 02/03/2020 1109   KETONESUR 5 (A) 08/14/2020 2107   PROTEINUR NEGATIVE 08/14/2020 2107   NITRITE NEGATIVE 08/14/2020 2107   LEUKOCYTESUR NEGATIVE 08/14/2020 2107     Microbiology Recent Results (from the past 240 hour(s))  Resp Panel by RT-PCR (Flu A&B, Covid) Nasopharyngeal Swab     Status: None   Collection Time: 10/01/20  8:49 AM   Specimen: Nasopharyngeal Swab; Nasopharyngeal(NP) swabs in vial transport medium  Result Value Ref Range Status   SARS Coronavirus 2 by RT PCR NEGATIVE NEGATIVE Final    Comment: (NOTE) SARS-CoV-2 target nucleic acids are NOT DETECTED.  The SARS-CoV-2 RNA is generally detectable in upper respiratory specimens during the acute phase  of infection. The lowest concentration of SARS-CoV-2 viral copies this assay can detect is 138 copies/mL. A negative result does not preclude SARS-Cov-2 infection and should not be used as the sole basis for treatment or other patient management decisions. A negative result may occur with  improper specimen collection/handling, submission of specimen other than nasopharyngeal swab, presence of viral mutation(s) within the areas targeted by this assay, and inadequate number of viral copies(<138 copies/mL). A negative result must be combined with clinical observations, patient history, and epidemiological information. The expected result is Negative.  Fact Sheet for Patients:  EntrepreneurPulse.com.au  Fact Sheet for Healthcare Providers:  IncredibleEmployment.be  This test is no t yet approved or cleared by the [place] FDA and  has been authorized for detection and/or diagnosis of SARS-CoV-2 by FDA under an Emergency Use Authorization (EUA). This EUA will remain  in effect (meaning this test can be used) for the duration of the COVID-19 declaration under Section 564(b)(1) of the Act,  21 U.S.C.section 360bbb-3(b)(1), unless the authorization is terminated  or revoked sooner.       Influenza A by PCR NEGATIVE NEGATIVE Final   Influenza B by PCR NEGATIVE NEGATIVE Final    Comment: (NOTE) The Xpert Xpress SARS-CoV-2/FLU/RSV plus assay is intended as an aid in the diagnosis of influenza from Nasopharyngeal swab specimens and should not be used as a sole basis for treatment. Nasal washings and aspirates are unacceptable for Xpert Xpress SARS-CoV-2/FLU/RSV testing.  Fact Sheet for Patients: EntrepreneurPulse.com.au  Fact Sheet for Healthcare Providers: IncredibleEmployment.be  This test is not yet approved or cleared by the [place] FDA and has been authorized for detection and/or diagnosis of  SARS-CoV-2 by FDA under an Emergency Use Authorization (EUA). This EUA will remain in effect (meaning this test can be used) for the duration of the COVID-19 declaration under Section 564(b)(1) of the Act, 21 U.S.C. section 360bbb-3(b)(1), unless the authorization is terminated or revoked.  Performed at [facility], [address] [place], [place] [address]        Inpatient Medications:   Scheduled Meds: . ipratropium  2 puff Inhalation Q4H  . [START ON 10/02/2020] prenatal multivitamin  1 tablet Oral Q1200   Continuous Infusions:   Radiological Exams on Admission: CT ANGIO CHEST PE W OR WO CONTRAST  Result Date: 10/01/2020 CLINICAL DATA:  Increasing shortness of breath. Pregnant patient. The patient was diagnosed with COVID-19 in January, 2022. EXAM: CT ANGIOGRAPHY CHEST WITH CONTRAST TECHNIQUE: Multidetector CT imaging of the chest was performed using the standard protocol during bolus administration of intravenous contrast. Multiplanar CT image reconstructions and MIPs were obtained to evaluate the vascular anatomy. CONTRAST:  75 mL OMNIPAQUE IOHEXOL 350 MG/ML SOLN COMPARISON:  Single-view of the chest today. FINDINGS: Cardiovascular: Satisfactory opacification of the pulmonary arteries to the segmental level. No evidence of pulmonary embolism. Normal heart size. No pericardial effusion. Mediastinum/Nodes: No enlarged mediastinal, hilar, or axillary lymph nodes. Thyroid gland, trachea, and esophagus demonstrate no significant findings. Lungs/Pleura: Lungs are clear. No pleural effusion or pneumothorax. Upper Abdomen: Negative. Musculoskeletal: Negative. Review of the MIP images confirms the above findings. IMPRESSION: Negative for pulmonary embolus.  Negative chest CT. Electronically Signed   By: Inge Rise M.D.   On: 10/01/2020 15:10   DG Chest Port 1 View  Result Date: 10/01/2020 CLINICAL DATA:  35 year old female positive COVID-19. Pregnant in the 3rd trimester.  Shortness of breath. EXAM: PORTABLE CHEST 1 VIEW COMPARISON:  None. FINDINGS: Portable AP upright view at 0857 hours. Cardiac size at the upper limits of normal. Other mediastinal contours are within normal limits. Visualized tracheal air column is within normal limits. Mildly low lung volumes. Mild symmetric increased pulmonary interstitial markings. There is also asymmetric patchy and indistinct opacity at the right lower lung. No pneumothorax, pleural effusion or consolidation. No osseous abnormality identified. IMPRESSION: Low lung volumes with asymmetric patchy and interstitial pulmonary opacity suspicious for COVID-19 pneumonia in this setting. Electronically Signed   By: Genevie Ann M.D.   On: 10/01/2020 09:27    Impression/Recommendations Principal Problem:   SOB (shortness of breath) Active Problems:   Asthma   COVID-19 virus infection   Microcytic anemia   Pregnancy  SOB (shortness of breath): No oxygen desaturation.  No wheezing or rhonchi on auscultation.  Patient has slightly decreased air movement bilaterally on auscultation, does not seem to have asthma exacerbation. D-dimer slightly elevated 0.57, but CT angiogram is negative for PE (per Dr. Gilman Schmidt, it is OK to do  CTA) .  Chest x-ray findings are consistent with recent COVID-19 infection.  Potential explanation is sequela from recent COVID-19 infection.  The repeated Covid PCR is negative.  -Supportive care -Atrovent inhaler -As needed albuterol inhaler -As needed Mucinex -Nasal cannula oxygen as needed  Asthma: Stable -Bronchodilators as above  COVID-19 virus infection: Patient had positive Covid on 08/08/2020 -Repeat PCR negative, no further treatment needed for Covid infection  Microcytic anemia: Hemoglobin 8.2 (10.3 on 08/14/2020).  I discussed with Dr. Gilman Schmidt, she does not think patient needs to be transfused now. -Follow-up with CBC -check anemia panel  Pregnancy: G2P1 at 31w4. No complications currently -management per  primary OB/GYN team    Thank you for this consultation.  Our [facility] hospitalist team will follow the patient with you.   Time Spent: 40 min  Ivor Costa M.D. [facility] 10/01/2020, 3:42 PM

## 2020-10-01 NOTE — Progress Notes (Signed)
RT in to assess patient, Patient states she gets SOB with exertion ans long conversations. Breaths sounds were clear to auscultation at this time. Patient states her breathing is fine at this time. Albuterol and Atrovent MDI has been ordered PRN by MD. Notified patient to let RN know if MDI is needed. RR 22 and HR 82.

## 2020-10-01 NOTE — Progress Notes (Addendum)
Inpatient Diabetes Program Recommendations  ADA Standards of Care 2021 Diabetes in Pregnancy Target Glucose Ranges:  Fasting: 60 - 90 mg/dL Preprandial: 60 - 105 mg/dL 1 hr postprandial: Less than 140mg /dL (from first bite of meal) 2 hr postprandial: Less than 120 mg/dL (from first bit of meal)    Lab Results  Component Value Date   GLUCAP 133 (H) 10/01/2020   HGBA1C 5.5 07/27/2020    Review of Glycemic Control Results for MOZELLA, REXRODE (MRN [id number]) as of 10/01/2020 09:41  Ref. Range 10/01/2020 08:17  Glucose-Capillary Latest Ref Range: 70 - 99 mg/dL 133 (H)   Diabetes history:  Diet controlled GDM  Current orders for Inpatient glycemic control: CBG's Q4H  Inpatient diabetes referral received for hyperglycemia.  CBG's Q4H ordered.  Current CBG is 133 mg/dL.  Attempted to call patient in room with no answer.  Will continue to follow.  Addendum@ 1456:  Consider Novolog 0-14 units TID after meals while in observation.     Thank you, Reche Dixon, RN, BSN Diabetes Coordinator Inpatient Diabetes Program [phone] (team pager from 8a-5p)

## 2020-10-01 NOTE — H&P (Signed)
Obstetrics Admission History & Physical   CC: Shortness of breath  HPI:  35 y.o. G2P1 @ [redacted]w[redacted]d(11/28/2020, by Last Menstrual Period). Admitted on 10/01/2020:   Patient Active Problem List   Diagnosis Date Noted  . Shortness of breath due to pregnancy in third trimester 10/01/2020  . SOB (shortness of breath) 10/01/2020  . Asthma 10/01/2020  . COVID-19 virus infection 10/01/2020  . Microcytic anemia 10/01/2020  . Pregnancy 10/01/2020  . Abdominal pain in pregnancy, second trimester 08/14/2020  . Gestational diabetes mellitus (GDM) in second trimester 07/27/2020  . Asthma affecting pregnancy in second trimester 07/22/2020  . Abnormal glucose tolerance test (GTT) during pregnancy, antepartum 07/07/2020  . Supervision of high risk pregnancy, antepartum 05/04/2020  . Obesity affecting pregnancy 05/04/2020    Presents for concerns over more labored respirations.  She has had recent Covid, now recovered, yet has labored breathing and fatigue and shortness of breath on exertion.  Denies fever, chills, wheeze, CP, cough.  Pt has h/o asthma.  Has used an inhaler intermittently.  Pt also has been dx w GDM, but has been unable to keep extensive home BS log for close daily monitoring.    [facility] Pregnancy complicated by gestational DM, obesity.  ROS: A review of systems was performed and negative, except as stated in the above HPI. PMHx:  Past Medical History:  Diagnosis Date  . Allergy   . Asthma   . Gestational diabetes    PSHx:  Past Surgical History:  Procedure Laterality Date  . NO PAST SURGERIES     Medications:  Medications Prior to Admission  Medication Sig Dispense Refill Last Dose  . Accu-Chek Softclix Lancets lancets Please monitor your blood glucose in the morning, prior to eating, and two hours after eating your meals throughout the day. You will receive further instruction at you nutrition and diabetes education appointment. (Patient not taking:  Reported on 08/06/2020) 100 each 6   . albuterol (VENTOLIN HFA) 108 (90 Base) MCG/ACT inhaler Inhale 2 puffs into the lungs every 6 (six) hours as needed for wheezing or shortness of breath. 8 g 2   . blood glucose meter kit and supplies KIT Dispense based on patient and insurance preference. Use up to four times daily as directed. Please monitor blood glucose in the morning before breakfast and two hours after each meal during the day. You will receive further information on monitoring at your nutrition and diabetic education appointment. (Patient not taking: Reported on 08/06/2020) 1 each 0   . Blood Glucose Monitoring Suppl (ACCU-CHEK NANO SMARTVIEW) w/Device KIT Use as directed (Patient not taking: Reported on 08/06/2020) 1 kit 0   . glucose blood (ACCU-CHEK SMARTVIEW) test strip Use as instructed (Patient not taking: Reported on 08/06/2020) 100 each 12   . Multiple Vitamins-Minerals (MULTIVITAMIN WITH MINERALS) tablet Take 1 tablet by mouth daily. (Patient not taking: Reported on 05/04/2020)     . Prenat-Methylfol-Chol-Fish Oil (PRENATAL + COMPLETE MULTI) 0.267 & 373 MG THPK Take 1 tablet by mouth daily. 90 each 3    Allergies: has No Known Allergies. OBHx:  OB History  Gravida Para Term Preterm AB Living  2 1          SAB IAB Ectopic Multiple Live Births               # Outcome Date GA Lbr Len/2nd Weight Sex Delivery Anes PTL Lv  2 Current           1  Para            ZOX:[id number] except as detailed in HPI.Marland Kitchen  No family history of birth defects. Soc Hx: Alcohol: none and Recreational drug use: none  Objective:   Vitals:   10/01/20 1240 10/01/20 1254  BP:  (!) 113/54  Pulse:  (!) 101  Resp:  (!) 28  Temp:  98.4 F (36.9 C)  SpO2: 95%    Constitutional: Well nourished, well developed female in no acute distress.  HEENT: normal Skin: Warm and dry.  Cardiovascular:Regular rate and rhythm.   Extremity: trace to 1+ bilateral pedal edema Respiratory: Clear to  auscultation bilateral. Normal respiratory effort Abdomen: gravid, ND, FHT present, without guarding, without rebound tenderness on exam Back: no CVAT Neuro: DTRs 2+, Cranial nerves grossly intact Psych: Alert and Oriented x3. No memory deficits. Normal mood and affect.  MS: normal gait, normal bilateral lower extremity ROM/strength/stability.  A NST procedure was performed with FHR monitoring and a normal baseline established, appropriate time of 20-40 minutes of evaluation, and accels >2 seen w 15x15 characteristics.  Results show a REACTIVE NST.    Perinatal info:  Blood type: O positive Rubella- Immune Varicella -Not immune TDaP tetanus status unknown to the patient RPR NR / HIV Neg/ HBsAg Neg   Assessment & Plan:   35 y.o. G2P1 @ [redacted]w[redacted]d Admitted on 10/01/2020: Respiratory disease, Gestational Diabetes    Inpatient monitoring of BS and decision for therapy (insulin, based on non-compliance as an outpatient to obtain data and drive care.  Will trend BS over 24 hours and then decide on Insulin.  Also education for continued outpatient monitoring.  Appreciate internal medicine consult and input.  CXR, CT (counseled pt as to pros and cons of CT and exposure risks), reassuring except for post-Covid effects.  Plan medicine as advised. Inhaler- Atrovent, Albuterol Mucinex  Fetal wellbeing reassuring.  PBarnett Applebaum MD, [facility], [facility] 10/01/2020  4:49 PM

## 2020-10-02 DIAGNOSIS — O99891 Other specified diseases and conditions complicating pregnancy: Secondary | ICD-10-CM

## 2020-10-02 DIAGNOSIS — J45909 Unspecified asthma, uncomplicated: Secondary | ICD-10-CM | POA: Diagnosis not present

## 2020-10-02 DIAGNOSIS — O99113 Other diseases of the blood and blood-forming organs and certain disorders involving the immune mechanism complicating pregnancy, third trimester: Secondary | ICD-10-CM

## 2020-10-02 DIAGNOSIS — O99513 Diseases of the respiratory system complicating pregnancy, third trimester: Secondary | ICD-10-CM | POA: Diagnosis not present

## 2020-10-02 DIAGNOSIS — Z3A31 31 weeks gestation of pregnancy: Secondary | ICD-10-CM

## 2020-10-02 DIAGNOSIS — R0602 Shortness of breath: Secondary | ICD-10-CM

## 2020-10-02 DIAGNOSIS — O24419 Gestational diabetes mellitus in pregnancy, unspecified control: Secondary | ICD-10-CM | POA: Diagnosis not present

## 2020-10-02 DIAGNOSIS — D509 Iron deficiency anemia, unspecified: Secondary | ICD-10-CM | POA: Diagnosis not present

## 2020-10-02 LAB — BASIC METABOLIC PANEL
Anion gap: 6 (ref 5–15)
BUN: 7 mg/dL (ref 6–20)
CO2: 23 mmol/L (ref 22–32)
Calcium: 8.8 mg/dL — ABNORMAL LOW (ref 8.9–10.3)
Chloride: 106 mmol/L (ref 98–111)
Creatinine, Ser: 0.44 mg/dL (ref 0.44–1.00)
GFR, Estimated: >60 mL/min (ref >60)
Glucose, Bld: 96 mg/dL (ref 70–99)
Potassium: 3.6 mmol/L (ref 3.5–5.1)
Sodium: 135 mmol/L (ref 135–145)

## 2020-10-02 LAB — CBC
HCT: 27.1 % — ABNORMAL LOW (ref 36.0–46.0)
Hemoglobin: 8.3 g/dL — ABNORMAL LOW (ref 12.0–15.0)
MCH: 23.3 pg — ABNORMAL LOW (ref 26.0–34.0)
MCHC: 30.6 g/dL (ref 30.0–36.0)
MCV: 76.1 fL — ABNORMAL LOW (ref 80.0–100.0)
Platelets: 244 10*3/uL (ref 150–400)
RBC: 3.56 MIL/uL — ABNORMAL LOW (ref 3.87–5.11)
RDW: 15.9 % — ABNORMAL HIGH (ref 11.5–15.5)
WBC: 9.9 10*3/uL (ref 4.0–10.5)
nRBC: 0 % (ref 0.0–0.2)

## 2020-10-02 LAB — GLUCOSE, CAPILLARY
Glucose-Capillary: 111 mg/dL — ABNORMAL HIGH (ref 70–99)
Glucose-Capillary: 126 mg/dL — ABNORMAL HIGH (ref 70–99)
Glucose-Capillary: 102 mg/dL — ABNORMAL HIGH (ref 70–99)

## 2020-10-02 MED ORDER — METFORMIN HCL 500 MG PO TABS: 500.0000 mg | ORAL_TABLET | Freq: Every day | ORAL | 2 refills | Status: DC

## 2020-10-02 MED ORDER — CYANOCOBALAMIN 1000 MCG PO TABS: 1000.0000 ug | ORAL_TABLET | Freq: Every day | ORAL | 2 refills | Status: DC

## 2020-10-02 MED ORDER — FERROUS SULFATE 325 (65 FE) MG PO TABS: 325.0000 mg | ORAL_TABLET | Freq: Once | ORAL | 2 refills | Status: DC

## 2020-10-02 MED ORDER — DM-GUAIFENESIN ER 30-600 MG PO TB12: 1.0000 | ORAL_TABLET | Freq: Two times a day (BID) | ORAL | 2 refills | Status: DC | PRN

## 2020-10-02 MED ORDER — ALBUTEROL SULFATE HFA 108 (90 BASE) MCG/ACT IN AERS: 2.0000 | INHALATION_SPRAY | RESPIRATORY_TRACT | 3 refills | Status: DC | PRN

## 2020-10-02 MED ORDER — IPRATROPIUM BROMIDE HFA 17 MCG/ACT IN AERS: 2.0000 | INHALATION_SPRAY | RESPIRATORY_TRACT | 12 refills | Status: DC

## 2020-10-02 MED ORDER — VITAMIN B-12 1000 MCG PO TABS
1000.0000 ug | ORAL_TABLET | Freq: Every day | ORAL | Status: DC
  Administered 2020-10-02: 1000 ug via ORAL
  Filled 2020-10-02 (×2): qty 1

## 2020-10-02 MED ORDER — FERROUS SULFATE 325 (65 FE) MG PO TABS: 325.0000 mg | ORAL_TABLET | Freq: Two times a day (BID) | ORAL | Status: DC

## 2020-10-02 MED ORDER — SODIUM CHLORIDE 0.9 % IV SOLN
510.0000 mg | Freq: Once | INTRAVENOUS | Status: AC
  Administered 2020-10-02: 510 mg via INTRAVENOUS
  Filled 2020-10-02: qty 17

## 2020-10-02 NOTE — Progress Notes (Signed)
PROGRESS NOTE Consult progress note.  Caitlin Ortega  CWC:[id number] DOB: [birth year] DOA: 10/01/2020 PCP: Valerie Roys, DO   Brief Narrative: Taken from consult note. Caitlin Ortega is an 35 y.o. female with a past medical history of asthma, gestational diabetes, allergy, COVID-19 infection 08/08/2020, pregnancy (G2P1 at 55w4), who is admitted by OB/GYN.  We are asked to consult due to shortness breath.  Patient states that she has been having mild shortness of breath for more than 1 month, which has worsened in the past several days.  She denies cough, chest pain, fever or chills.  Of note, patient had positive Covid test on 08/08/2020, but the repeated PCR tested today is negative.  Patient states that she has mild intermittent diarrhea, 2 episodes yesterday, no diarrhea today.  Denies abdominal pain, nausea, vomiting.  Denies symptoms of UTI.  No vaginal bleeding or discharge.  Her labs were unremarkable except anemia, and anemia panel with iron and borderline B12 deficiency.  Subjective: Patient has no new complaint when seen today.  Getting her IV iron.  Discussed about continuation of iron and B12 supplement.  Wants to go home  Assessment & Plan:   Principal Problem:   SOB (shortness of breath) Active Problems:   Gestational diabetes mellitus (GDM) in third trimester   Shortness of breath due to pregnancy in third trimester   Asthma   COVID-19 virus infection   Microcytic anemia   Pregnancy  Dyspnea.  Mostly exertional, multifactorial at this time as chest x-ray with sequelae of COVID-19 pneumonia and she had iron deficiency anemia, hemoglobin at 8.2, anemia panel consistent with iron deficiency anemia and borderline low B12.  Saturating well on room air. -Continue supportive care  Iron deficiency anemia.  Most likely contributing to her symptoms.  OB does not think that she needs transfusion at this time. -We will give her 1 dose of Feraheme. -Start her on iron and B12  supplement -She will need monitoring of her hemoglobin closely.  Asthma.  Stable, no wheezing -Continue bronchodilators as needed  Pregnancy: G2P1 at 31w4. No complications currently -management per primary OB/GYN team.  Thank you for the consult, I will sign off at this time.  Objective: Vitals:   10/02/20 0015 10/02/20 0644 10/02/20 0749 10/02/20 1046  BP:   (!) 109/50   Pulse:   86   Resp: (!) 22 18 (!) 24   Temp:   98.1 F (36.7 C)   TempSrc:   Oral   SpO2:   97%   Weight:    99 kg  Height:    5\' 5"  (1.651 m)    Intake/Output Summary (Last 24 hours) at 10/02/2020 1202 Last data filed at 10/02/2020 1050 Gross per 24 hour  Intake 120 ml  Output --  Net 120 ml   Filed Weights   10/02/20 1046  Weight: 99 kg    Examination:  General exam: Appears calm and comfortable  Respiratory system: Clear to auscultation. Respiratory effort normal. Cardiovascular system: S1 & S2 heard, RRR. No JVD, murmurs, rubs, gallops or clicks. Gastrointestinal system: Soft, nontender, nondistended, bowel sounds positive.  Gravid uterus Central nervous system: Alert and oriented. No focal neurological deficits.Symmetric 5 x 5 power. Extremities: No edema, no cyanosis, pulses intact and symmetrical. Skin: No rashes, lesions or ulcers Psychiatry: Judgement and insight appear normal. Mood & affect appropriate.    Level of care: Labor and Delivery  All the records are reviewed and case discussed with Care Management/Social Worker. Management plans  discussed with the patient, nursing and they are in agreement.   Data Reviewed: I have personally reviewed following labs and imaging studies  CBC: Recent Labs  Lab 10/01/20 0842 10/02/20 0448  WBC 9.1 9.9  HGB 8.2* 8.3*  HCT 27.0* 27.1*  MCV 76.5* 76.1*  PLT 238 867   Basic Metabolic Panel: Recent Labs  Lab 10/01/20 0842 10/02/20 0448  NA 135 135  K 3.5 3.6  CL 106 106  CO2 20* 23  GLUCOSE 120* 96  BUN 8 7  CREATININE 0.57  0.44  CALCIUM 8.6* 8.8*   GFR: Estimated Creatinine Clearance: 117.6 mL/min (by C-G formula based on SCr of 0.44 mg/dL). Liver Function Tests: No results for input(s): AST, ALT, ALKPHOS, BILITOT, PROT, ALBUMIN in the last 168 hours. Recent Labs  Lab 10/01/20 0842  LIPASE 26   No results for input(s): AMMONIA in the last 168 hours. Coagulation Profile: No results for input(s): INR, PROTIME in the last 168 hours. Cardiac Enzymes: No results for input(s): CKTOTAL, CKMB, CKMBINDEX, TROPONINI in the last 168 hours. BNP (last 3 results) No results for input(s): PROBNP in the last 8760 hours. HbA1C: No results for input(s): HGBA1C in the last 72 hours. CBG: Recent Labs  Lab 10/01/20 1251 10/01/20 1620 10/01/20 2028 10/02/20 0856 10/02/20 1046  GLUCAP 113* 75 111* 126* 102*   Lipid Profile: No results for input(s): CHOL, HDL, LDLCALC, TRIG, CHOLHDL, LDLDIRECT in the last 72 hours. Thyroid Function Tests: No results for input(s): TSH, T4TOTAL, FREET4, T3FREE, THYROIDAB in the last 72 hours. Anemia Panel: Recent Labs    10/01/20 1600  VITAMINB12 245  FOLATE 14.2  FERRITIN 6*  TIBC 689*  IRON 36  RETICCTPCT 2.1   Sepsis Labs: No results for input(s): PROCALCITON, LATICACIDVEN in the last 168 hours.  Recent Results (from the past 240 hour(s))  Resp Panel by RT-PCR (Flu A&B, Covid) Nasopharyngeal Swab     Status: None   Collection Time: 10/01/20  8:49 AM   Specimen: Nasopharyngeal Swab; Nasopharyngeal(NP) swabs in vial transport medium  Result Value Ref Range Status   SARS Coronavirus 2 by RT PCR NEGATIVE NEGATIVE Final    Comment: (NOTE) SARS-CoV-2 target nucleic acids are NOT DETECTED.  The SARS-CoV-2 RNA is generally detectable in upper respiratory specimens during the acute phase of infection. The lowest concentration of SARS-CoV-2 viral copies this assay can detect is 138 copies/mL. A negative result does not preclude SARS-Cov-2 infection and should not be used  as the sole basis for treatment or other patient management decisions. A negative result may occur with  improper specimen collection/handling, submission of specimen other than nasopharyngeal swab, presence of viral mutation(s) within the areas targeted by this assay, and inadequate number of viral copies(<138 copies/mL). A negative result must be combined with clinical observations, patient history, and epidemiological information. The expected result is Negative.  Fact Sheet for Patients:  EntrepreneurPulse.com.au  Fact Sheet for Healthcare Providers:  IncredibleEmployment.be  This test is no t yet approved or cleared by the [place] FDA and  has been authorized for detection and/or diagnosis of SARS-CoV-2 by FDA under an Emergency Use Authorization (EUA). This EUA will remain  in effect (meaning this test can be used) for the duration of the COVID-19 declaration under Section 564(b)(1) of the Act, 21 U.S.C.section 360bbb-3(b)(1), unless the authorization is terminated  or revoked sooner.       Influenza A by PCR NEGATIVE NEGATIVE Final   Influenza B by PCR NEGATIVE NEGATIVE Final  Comment: (NOTE) The Xpert Xpress SARS-CoV-2/FLU/RSV plus assay is intended as an aid in the diagnosis of influenza from Nasopharyngeal swab specimens and should not be used as a sole basis for treatment. Nasal washings and aspirates are unacceptable for Xpert Xpress SARS-CoV-2/FLU/RSV testing.  Fact Sheet for Patients: EntrepreneurPulse.com.au  Fact Sheet for Healthcare Providers: IncredibleEmployment.be  This test is not yet approved or cleared by the [place] FDA and has been authorized for detection and/or diagnosis of SARS-CoV-2 by FDA under an Emergency Use Authorization (EUA). This EUA will remain in effect (meaning this test can be used) for the duration of the COVID-19 declaration under Section 564(b)(1)  of the Act, 21 U.S.C. section 360bbb-3(b)(1), unless the authorization is terminated or revoked.  Performed at [facility], [address], [place], [place] [address]      Radiology Studies: CT ANGIO CHEST PE W OR WO CONTRAST  Result Date: 10/01/2020 CLINICAL DATA:  Increasing shortness of breath. Pregnant patient. The patient was diagnosed with COVID-19 in January, 2022. EXAM: CT ANGIOGRAPHY CHEST WITH CONTRAST TECHNIQUE: Multidetector CT imaging of the chest was performed using the standard protocol during bolus administration of intravenous contrast. Multiplanar CT image reconstructions and MIPs were obtained to evaluate the vascular anatomy. CONTRAST:  75 mL OMNIPAQUE IOHEXOL 350 MG/ML SOLN COMPARISON:  Single-view of the chest today. FINDINGS: Cardiovascular: Satisfactory opacification of the pulmonary arteries to the segmental level. No evidence of pulmonary embolism. Normal heart size. No pericardial effusion. Mediastinum/Nodes: No enlarged mediastinal, hilar, or axillary lymph nodes. Thyroid gland, trachea, and esophagus demonstrate no significant findings. Lungs/Pleura: Lungs are clear. No pleural effusion or pneumothorax. Upper Abdomen: Negative. Musculoskeletal: Negative. Review of the MIP images confirms the above findings. IMPRESSION: Negative for pulmonary embolus.  Negative chest CT. Electronically Signed   By: Inge Rise M.D.   On: 10/01/2020 15:10   DG Chest Port 1 View  Result Date: 10/01/2020 CLINICAL DATA:  35 year old female positive COVID-19. Pregnant in the 3rd trimester. Shortness of breath. EXAM: PORTABLE CHEST 1 VIEW COMPARISON:  None. FINDINGS: Portable AP upright view at 0857 hours. Cardiac size at the upper limits of normal. Other mediastinal contours are within normal limits. Visualized tracheal air column is within normal limits. Mildly low lung volumes. Mild symmetric increased pulmonary interstitial markings. There is also asymmetric patchy and  indistinct opacity at the right lower lung. No pneumothorax, pleural effusion or consolidation. No osseous abnormality identified. IMPRESSION: Low lung volumes with asymmetric patchy and interstitial pulmonary opacity suspicious for COVID-19 pneumonia in this setting. Electronically Signed   By: Genevie Ann M.D.   On: 10/01/2020 09:27    Scheduled Meds: . [START ON 10/03/2020] ferrous sulfate  325 mg Oral BID WC  . ipratropium  2 puff Inhalation Q4H  . prenatal multivitamin  1 tablet Oral Q1200  . vitamin B-12  1,000 mcg Oral Daily   Continuous Infusions:   LOS: 0 days   Time spent: 35 minutes. More than 50% of the time was spent in counseling/coordination of care  Lorella Nimrod, MD [facility]  If 7PM-7AM, please contact night-coverage [email or web address]  10/02/2020, 12:02 PM   This record has been created using Systems analyst. Errors have been sought and corrected,but may not always be located. Such creation errors do not reflect on the standard of care.

## 2020-10-02 NOTE — Progress Notes (Addendum)
Pt discharged home per K.Veal,CNM order.  NST reactive and appropriate for gestational age. Pt stable and ambulatory. An After Visit Summary was printed and given to the patient. Discharge education completed with patient/family including follow up instructions, medication list, d/c activities limitations if indicated, with other d/c instructions as indicated by CNM . Medications sent to pharmacy on file and RN educated pt on medication information and administration. Pt received labor and bleeding precautions. Patient able to verbalize understanding, all questions fully answered. Patient instructed to return to ED, call 911, or call MD for any changes in condition. Pt discharged home via personal vehicle with support person.

## 2020-10-02 NOTE — Progress Notes (Signed)
Daily Antepartum Note  Admission Date: 10/01/2020 Current Date: 10/02/2020 11:42 AM  Caitlin Ortega is a 35 y.o. G2P1 @ [redacted]w[redacted]d by LMP (c/w 10wk [place]), HD#1, admitted for SOB, GDM with questionable glucose control outpatient.   Pregnancy complicated by: Patient Active Problem List   Diagnosis Date Noted  . Shortness of breath due to pregnancy in third trimester 10/01/2020  . SOB (shortness of breath) 10/01/2020  . Asthma 10/01/2020  . COVID-19 virus infection 10/01/2020  . Microcytic anemia 10/01/2020  . Pregnancy 10/01/2020  . Abdominal pain in pregnancy, second trimester 08/14/2020  . Gestational diabetes mellitus (GDM) in third trimester 07/27/2020  . Asthma affecting pregnancy in second trimester 07/22/2020  . Abnormal glucose tolerance test (GTT) during pregnancy, antepartum 07/07/2020  . Supervision of high risk pregnancy, antepartum 05/04/2020  . Obesity affecting pregnancy 05/04/2020    Overnight/24hr events:  No significant events  Subjective:  Patient is resting in bed. Patient states that her SOB is at her baseline and she does not feel it is an acute concern today. Patient states that her SOB and increased as her pregnancy as progressed. She states she has had difficulty getting an inhaler rx filled and has been using her friend's expired albuterol inhaler. Patient states that she feels her blood glucose has been well-controlled with her current diet although she does not have a glucose log. She states she has been working with nutrition and making dietary changes. Patient is requesting discharge today. She feels her breathing and blood sugars are appropriate.  Objective:   Vitals:   10/02/20 0644 10/02/20 0749  BP:  (!) 109/50  Pulse:  86  Resp: 18 (!) 24  Temp:  98.1 F (36.7 C)  SpO2:  97%   Temp:  [98.1 F (36.7 C)-98.7 F (37.1 C)] 98.1 F (36.7 C) (02/25 0749) Pulse Rate:  [82-101] 86 (02/25 0749) Resp:  [18-28] 24 (02/25 0749) BP: (104-114)/(50-64) 109/50  (02/25 0749) SpO2:  [94 %-100 %] 97 % (02/25 0749) Weight:  [99 kg] 99 kg (02/25 1046) Temp (24hrs), Avg:98.3 F (36.8 C), Min:98.1 F (36.7 C), Max:98.7 F (37.1 C)   Intake/Output Summary (Last 24 hours) at 10/02/2020 1142 Last data filed at 10/02/2020 1050 Gross per 24 hour  Intake 120 ml  Output -  Net 120 ml     Current Vital Signs 24h Vital Sign Ranges  T 98.1 F (36.7 C) Temp  Avg: 98.3 F (36.8 C)  Min: 98.1 F (36.7 C)  Max: 98.7 F (37.1 C)  BP (!) 109/50 BP  Min: 104/51  Max: 114/60  HR 86 Pulse  Avg: 90.4  Min: 82  Max: 101  RR (!) 24 Resp  Avg: 22  Min: 18  Max: 28  SaO2 97 %   SpO2  Avg: 95.9 %  Min: 94 %  Max: 100 %       24 Hour I/O Current Shift I/O  Time Ins Outs No intake/output data recorded. 02/25 0701 - 02/25 1900 In: 120 [P.O.:120] Out: -    Patient Vitals for the past 24 hrs:  BP Temp Temp src Pulse Resp SpO2 Height Weight  10/02/20 1046 - - - - - - 5\' 5"  (1.651 m) 99 kg  10/02/20 0749 (!) 109/50 98.1 F (36.7 C) Oral 86 (!) 24 97 % - -  10/02/20 1610 - - - - 18 - - -  10/02/20 0015 - - - - (!) 22 - - -  10/01/20 2302 - - - - -  97 % - -  10/01/20 2302 110/64 98.2 F (36.8 C) Oral 95 (!) 22 - - -  10/01/20 1902 114/60 98.7 F (37.1 C) Oral 88 (!) 22 97 % - -  10/01/20 1747 (!) 104/51 98.1 F (36.7 C) - 82 18 100 % - -  10/01/20 1254 (!) 113/54 98.4 F (36.9 C) Oral (!) 101 (!) 28 - - -  10/01/20 1240 - - - - - 95 % - -  10/01/20 1235 - - - - - 95 % - -  10/01/20 1230 - - - - - 94 % - -  10/01/20 1225 - - - - - 95 % - -  10/01/20 1220 - - - - - 94 % - -  10/01/20 1215 - - - - - 95 % - -  10/01/20 1210 - - - - - 94 % - -  10/01/20 1205 - - - - - 98 % - -  10/01/20 1200 - - - - - 96 % - -  10/01/20 1155 - - - - - 96 % - -  10/01/20 1150 - - - - - 96 % - -  10/01/20 1145 - - - - - 96 % - -    Physical exam: General: Well nourished, well developed female in no acute distress. Abdomen: gravid (size > dates, fundal height 34  cm) Cardiovascular: S1, S2 normal, no murmur, rub or gallop, regular rate and rhythm Respiratory: CTAB Extremities: no clubbing, cyanosis or edema Skin: Warm and dry.   Medications: Current Facility-Administered Medications  Medication Dose Route Frequency Provider Last Rate Last Admin  . albuterol (VENTOLIN HFA) 108 (90 Base) MCG/ACT inhaler 2 puff  2 puff Inhalation Q4H PRN Gae Dry, MD      . dextromethorphan-guaiFENesin (MUCINEX DM) 30-600 MG per 12 hr tablet 1 tablet  1 tablet Oral BID PRN Gae Dry, MD      . Derrill Memo ON 10/03/2020] ferrous sulfate tablet 325 mg  325 mg Oral BID WC Lorella Nimrod, MD      . ipratropium (ATROVENT HFA) inhaler 2 puff  2 puff Inhalation Q4H Gae Dry, MD   2 puff at 10/02/20 1038  . prenatal multivitamin tablet 1 tablet  1 tablet Oral Q1200 Gae Dry, MD   1 tablet at 10/02/20 1038  . vitamin B-12 (CYANOCOBALAMIN) tablet 1,000 mcg  1,000 mcg Oral Daily Lorella Nimrod, MD   1,000 mcg at 10/02/20 1038    Labs:  Recent Labs  Lab 10/01/20 0842 10/02/20 0448  WBC 9.1 9.9  HGB 8.2* 8.3*  HCT 27.0* 27.1*  PLT 238 244    Recent Labs  Lab 10/01/20 0842 10/02/20 0448  NA 135 135  K 3.5 3.6  CL 106 106  CO2 20* 23  BUN 8 7  CREATININE 0.57 0.44  CALCIUM 8.6* 8.8*  GLUCOSE 120* 96     Radiology:  CLINICAL DATA:  Increasing shortness of breath. Pregnant patient. The patient was diagnosed with COVID-19 in January, 2022.  EXAM: CT ANGIOGRAPHY CHEST WITH CONTRAST  TECHNIQUE: Multidetector CT imaging of the chest was performed using the standard protocol during bolus administration of intravenous contrast. Multiplanar CT image reconstructions and MIPs were obtained to evaluate the vascular anatomy.  CONTRAST:  75 mL OMNIPAQUE IOHEXOL 350 MG/ML SOLN  COMPARISON:  Single-view of the chest today.  FINDINGS: Cardiovascular: Satisfactory opacification of the pulmonary arteries to the segmental level. No evidence  of pulmonary embolism. Normal heart size.  No pericardial effusion.  Mediastinum/Nodes: No enlarged mediastinal, hilar, or axillary lymph nodes. Thyroid gland, trachea, and esophagus demonstrate no significant findings.  Lungs/Pleura: Lungs are clear. No pleural effusion or pneumothorax.  Upper Abdomen: Negative.  Musculoskeletal: Negative.  Review of the MIP images confirms the above findings.  IMPRESSION: Negative for pulmonary embolus.  Negative chest CT.   CLINICAL DATA:  35 year old female positive COVID-19. Pregnant in the 3rd trimester. Shortness of breath.  EXAM: PORTABLE CHEST 1 VIEW  COMPARISON:  None.  FINDINGS: Portable AP upright view at 0857 hours. Cardiac size at the upper limits of normal. Other mediastinal contours are within normal limits. Visualized tracheal air column is within normal limits. Mildly low lung volumes. Mild symmetric increased pulmonary interstitial markings. There is also asymmetric patchy and indistinct opacity at the right lower lung. No pneumothorax, pleural effusion or consolidation. No osseous abnormality identified.  IMPRESSION: Low lung volumes with asymmetric patchy and interstitial pulmonary opacity suspicious for COVID-19 pneumonia in this setting.  Assessment & Plan:   *Pregnancy: Will obtain NST today * GDM: rec's per diabetes coordinator, initiate metformin 500 mg qHS. Reviewed US findings with patient regarding fetal size. Discussed importance of close monitoring outpatient, need to bring glucose log to OB visits to ensure glucose is well-controlled. *SOB: continue albuterol, atrovent, and mucinex - will await furter rec's from intermal medicine *Microcytic anemia: IV fe per internal medicine recs, plan to discharge with oral fe supplementation  *Dispo: Will continue to assess, pending further care coordination with consult teams, patient desires discharge today

## 2020-10-02 NOTE — Progress Notes (Signed)
Inpatient Diabetes Program Recommendations  AACE/ADA: New Consensus Statement on Inpatient Glycemic Control (2015)  Target Ranges:  Prepandial:   less than 140 mg/dL      Peak postprandial:   less than 180 mg/dL (1-2 hours)      Critically ill patients:  140 - 180 mg/dL                            Fasting:                     60-90 mg/dL   Lab Results  Component Value Date   GLUCAP 126 (H) 10/02/2020   HGBA1C 5.5 07/27/2020    Review of Glycemic Control Results for GUDELIA, Caitlin Ortega (MRN [id number]) as of 10/02/2020 09:15  Ref. Range 10/01/2020 08:17 10/01/2020 12:51 10/01/2020 16:20 10/02/2020 08:56  Glucose-Capillary Latest Ref Range: 70 - 99 mg/dL 133 (H) 113 (H) 75 126 (H)   Diabetes history: GDM  Current orders for Inpatient glycemic control: CBG's 4 times a day  Inpatient Diabetes Program Recommendations:     Might consider Metformin 500 mg QHS as fasting CBG's are above goal.  Spoke with patient on the phone this morning.  She states she did not have GDM with her first pregnancy.  Discussed 1 hour GTT results with her.  She states she was referred to a nutritionist and educated on diet and diabetes.  She has a functioning glucometer at home.  She states her fasting blood sugars are usually around 89 mg/dL which is an improvement with her diet changes.  The nutritionist gave her a chart to log her blood sugars.  Explained importance of checking her CBG's fasting and 1-2 hours after meals.  She should log her CBG's and bring meter and log to OB appointments.  Discussed CHO's and goal CHO's per meal.   Discussed importance of glucose levels remaining in goal.  She verbalizes understanding.    Will continue to follow while inpatient.  Thank you, Reche Dixon, RN, BSN Diabetes Coordinator Inpatient Diabetes Program [phone] (team pager from 8a-5p)

## 2020-10-02 NOTE — Discharge Summary (Signed)
Discharge Summary   Patient ID: Caitlin Ortega [id number] 35 y.o. 04-21-1986  Admit date: 10/01/2020  Discharge date: 10/02/2020  Principal Diagnoses:  Gestational diabetes mellitus in third trimester Shortness of breath due to pregnancy in third trimester SOB (shortness of breath) Asthma Pregnancy  Secondary Diagnoses:  Microcytic anemia COVID-19 virus infection  Procedures performed during the hospitalization:  EXAM: CT ANGIOGRAPHY CHEST WITH CONTRAST TECHNIQUE: Multidetector CT imaging of the chest was performed using the standard protocol during bolus administration of intravenous contrast. Multiplanar CT image reconstructions and MIPs were obtained to evaluate the vascular anatomy. CONTRAST:  75 mL OMNIPAQUE IOHEXOL 350 MG/ML SOLN COMPARISON:  Single-view of the chest today. FINDINGS: Cardiovascular: Satisfactory opacification of the pulmonary arteries to the segmental level. No evidence of pulmonary embolism. Normal heart size. No pericardial effusion. Mediastinum/Nodes: No enlarged mediastinal, hilar, or axillary lymph nodes. Thyroid gland, trachea, and esophagus demonstrate no significant findings. Lungs/Pleura: Lungs are clear. No pleural effusion or pneumothorax. Upper Abdomen: Negative. Musculoskeletal: Negative. Review of the MIP images confirms the above findings. IMPRESSION: Negative for pulmonary embolus.  Negative chest CT.  EXAM: PORTABLE CHEST 1 VIEW COMPARISON:  None. FINDINGS: Portable AP upright view at 0857 hours. Cardiac size at the upper limits of normal. Other mediastinal contours are within normal limits. Visualized tracheal air column is within normal limits. Mildly low lung volumes. Mild symmetric increased pulmonary interstitial markings. There is also asymmetric patchy and indistinct opacity at the right lower lung. No pneumothorax, pleural effusion or consolidation. No osseous abnormality identified. IMPRESSION: Low lung  volumes with asymmetric patchy and interstitial pulmonary opacity suspicious for COVID-19 pneumonia in this setting  NONSTRESS TEST INTERPRETATION  INDICATIONS: gestational diabetes mellitus FHR baseline: 135 RESULTS:  A NST procedure was performed with FHR monitoring and a normal baseline established, appropriate time of 20-40 minutes of evaluation, and accels >2 seen w 10x10 characteristics.  Results show a REACTIVE NST. Appropriate for current gestational age.   HPI:  Caitlin Ortega is an 35 y.o. female with a past medical history of asthma, gestational diabetes, allergy, COVID-19 infection 08/08/2020, pregnancy (G2P1 at 43w4), who was admitted on 10/01/20 for SOB and evaluation of glucose control in the setting of gestational diabetes. Patient reported having mild shortness of breath for more than 1 month, which has worsened in the past several days.  She denies cough, chest pain, fever or chills.  Of note, patient had positive Covid test on 08/08/2020, but the repeated PCR tested today is negative. Patient reports intermittent inhaler use, states she has been using her friends "expired" albuterol inhaler. Pt also has been dx w GDM. Patient reports that her blood glucose values have been well-controlled in the 120s. However, she has not been able to maintain a log to bring to her prenatal visit.   Prenatal care at: at [facility]. Pregnancy complicated by gestational DM, obesity.  Past Medical History:  Diagnosis Date  . Allergy   . Asthma   . Gestational diabetes     Past Surgical History:  Procedure Laterality Date  . NO PAST SURGERIES      No Known Allergies  Social History   Tobacco Use  . Smoking status: Former Research scientist (life sciences)  . Smokeless tobacco: Never Used  Vaping Use  . Vaping Use: Never used  Substance Use Topics  . Alcohol use: Not Currently  . Drug use: Never    Family History  Problem Relation Age of Onset  . Hypertension Mother   . Hypertension Sister   .  Heart disease  [facility] Course: Patient was admitted with evaluation for SOB including D-dimer being slightly elevated 0.57, but CT angiogram is negative for PE (per Dr. Gilman Schmidt, it is OK to do CTA) .  Chest x-ray findings were consistent with recent COVID-19 infection.  Potential explanation is sequela from recent COVID-19 infection.  The repeated Covid PCR is negative. Recommendations from internal medicine for SOB including supportive care, atrovent inhaler, albuterol inhaler as needed, and mucinex as needed. Microcytic anemia noted on evaluation, patient received IV fe supplementation with PO fe and B12 supplementation ordered at discharge. Diabetes coordinator consult was placed for GDMA1 dx with recommendations to discharge on Metformin, 500 mg, qHS. Discussed medication administration and blood glucose monitoring at length with patient. Reviewed risks of GDM in pregnancy and discussed concern for size/dates discrepancy with patient. Patient stated understanding. Fetal well-being reassuring at discharge with reactive NST, 10x10 accelerations noted and appropriate for gestational age.   Discharge Exam: BP (!) 109/50 (BP Location: Right Arm)   Pulse 86   Temp 98.1 F (36.7 C) (Oral)   Resp (!) 24   Ht '5\' 5"'  (1.651 m)   Wt 99 kg   LMP 02/22/2020   SpO2 97%   BMI 36.32 kg/m  Physical Exam Constitutional:      Appearance: Normal appearance. She is obese.  Genitourinary:     Genitourinary Comments: deferred  HENT:     Head: Normocephalic.  Cardiovascular:     Rate and Rhythm: Normal rate and regular rhythm.     Pulses: Normal pulses.  Pulmonary:     Effort: Pulmonary effort is normal.     Breath sounds: Normal breath sounds.  Abdominal:     Comments: Gravid, fundal height 34 cm  Musculoskeletal:        General: Normal range of motion.     Cervical back: Normal range of motion.  Neurological:     Mental Status: She is alert and oriented to person, place, and time.   Skin:    General: Skin is warm and dry.  Psychiatric:        Mood and Affect: Mood normal.        Behavior: Behavior normal.      Condition at Discharge: Stable  Complications affecting treatment: None  Discharge Medications:  Allergies as of 10/02/2020   No Known Allergies     Medication List    STOP taking these medications   Accu-Chek Nano SmartView w/Device Kit   Accu-Chek SmartView test strip Generic drug: glucose blood   Accu-Chek Softclix Lancets lancets   blood glucose meter kit and supplies Kit   multivitamin with minerals tablet     TAKE these medications   albuterol 108 (90 Base) MCG/ACT inhaler Commonly known as: VENTOLIN HFA Inhale 2 puffs into the lungs every 4 (four) hours as needed for wheezing or shortness of breath. What changed: when to take this   cyanocobalamin 1000 MCG tablet Take 1 tablet (1,000 mcg total) by mouth daily. Start taking on: October 03, 2020   dextromethorphan-guaiFENesin 30-600 MG 12hr tablet Commonly known as: MUCINEX DM Take 1 tablet by mouth 2 (two) times daily as needed for cough.   ferrous sulfate 325 (65 FE) MG tablet Take 1 tablet (325 mg total) by mouth once for 1 dose.   ipratropium 17 MCG/ACT inhaler Commonly known as: ATROVENT HFA Inhale 2 puffs into the lungs every 4 (four) hours.   metFORMIN 500 MG tablet Commonly known  as: GLUCOPHAGE Take 1 tablet (500 mg total) by mouth at bedtime.   Prenatal + Complete Multi 0.267 & 373 MG Thpk Take 1 tablet by mouth daily.       Follow-up arrangements:   Follow-up Information    [facility] Follow up in 1 week(s).   Specialty: Obstetrics and Gynecology Why: Daneil Dan scheduler will call to schedule an routine obstetric appointment for next week with a physician provider. Contact information: [address] [place] [address] [phone]              Discharge disposition: 01-Home or Self Care    A total  of 35 minutes were spent face-to-face with the patient as well as preparation, review, communication, and documentation during this encounter.    SignedOrlie Pollen, CNM  10/02/2020 11:33 AM

## 2020-10-08 ENCOUNTER — Ambulatory Visit (INDEPENDENT_AMBULATORY_CARE_PROVIDER_SITE_OTHER): Payer: Medicaid Other | Admitting: Obstetrics and Gynecology

## 2020-10-08 ENCOUNTER — Encounter: Payer: Self-pay | Admitting: Obstetrics and Gynecology

## 2020-10-08 ENCOUNTER — Other Ambulatory Visit: Payer: Self-pay

## 2020-10-08 DIAGNOSIS — F411 Generalized anxiety disorder: Secondary | ICD-10-CM | POA: Diagnosis not present

## 2020-10-08 DIAGNOSIS — O99512 Diseases of the respiratory system complicating pregnancy, second trimester: Secondary | ICD-10-CM

## 2020-10-08 DIAGNOSIS — J45909 Unspecified asthma, uncomplicated: Secondary | ICD-10-CM

## 2020-10-08 DIAGNOSIS — F331 Major depressive disorder, recurrent, moderate: Secondary | ICD-10-CM | POA: Diagnosis not present

## 2020-10-08 DIAGNOSIS — O24415 Gestational diabetes mellitus in pregnancy, controlled by oral hypoglycemic drugs: Secondary | ICD-10-CM

## 2020-10-08 DIAGNOSIS — O99212 Obesity complicating pregnancy, second trimester: Secondary | ICD-10-CM

## 2020-10-08 DIAGNOSIS — O0990 Supervision of high risk pregnancy, unspecified, unspecified trimester: Secondary | ICD-10-CM

## 2020-10-08 LAB — FETAL NONSTRESS TEST

## 2020-10-08 LAB — POCT URINALYSIS DIPSTICK OB
Glucose, UA: NEGATIVE
POC,PROTEIN,UA: NEGATIVE

## 2020-10-08 MED ORDER — PULMICORT FLEXHALER 90 MCG/ACT IN AEPB: 1.0000 | INHALATION_SPRAY | Freq: Two times a day (BID) | RESPIRATORY_TRACT | 6 refills | Status: DC

## 2020-10-08 NOTE — Progress Notes (Signed)
Routine Prenatal Care Visit  Subjective  Caitlin Ortega is a 35 y.o. G2P1 at [redacted]w[redacted]d being seen today for ongoing prenatal care.  She is currently monitored for the following issues for this high-risk pregnancy and has Supervision of high risk pregnancy, antepartum; Obesity affecting pregnancy; Abnormal glucose tolerance test (GTT) during pregnancy, antepartum; Asthma affecting pregnancy in second trimester; Gestational diabetes mellitus (GDM) in third trimester; Abdominal pain in pregnancy, second trimester; Shortness of breath due to pregnancy in third trimester; SOB (shortness of breath); Asthma; COVID-19 virus infection; Microcytic anemia; and Pregnancy on their problem list.  ----------------------------------------------------------------------------------- Patient reports breathing has improved. Brought glucose log. Using albuterol and atrovent with improvement in her breathing at home. Using them daily though..   Contractions: Not present. Vag. Bleeding: None.  Movement: Present. Denies leaking of fluid.  ----------------------------------------------------------------------------------- The following portions of the patient's history were reviewed and updated as appropriate: allergies, current medications, past family history, past medical history, past social history, past surgical history and problem list. Problem list updated.   Objective  Blood pressure 118/70, height 5\' 5"  (1.651 m), weight 217 lb 3.2 oz (98.5 kg), last menstrual period 02/22/2020. Pregravid weight 189 lb (85.7 kg) Total Weight Gain 28 lb 3.2 oz (12.8 kg) Urinalysis:      Fetal Status:     Movement: Present     General:  Alert, oriented and cooperative. Patient is in no acute distress.  Skin: Skin is warm and dry. No rash noted.   Cardiovascular: Normal heart rate noted  Respiratory: Normal respiratory effort, no problems with respiration noted  Abdomen: Soft, gravid, appropriate for gestational age.  Pain/Pressure: Absent     Pelvic:  Cervical exam deferred        Extremities: Normal range of motion.     Mental Status: Normal mood and affect. Normal behavior. Normal judgment and thought content.     Assessment   35 y.o. G2P1 at [redacted]w[redacted]d by  11/28/2020, by Last Menstrual Period presenting for routine prenatal visit  Plan   pregnancy Problems (from 05/04/20 to present)    Problem Noted Resolved   Gestational diabetes mellitus (GDM) in third trimester 07/27/2020 by Homero Fellers, MD No   Abnormal glucose tolerance test (GTT) during pregnancy, antepartum 07/07/2020 by Will Bonnet, MD No   Overview Addendum 07/27/2020 12:16 PM by Homero Fellers, MD    - failed early 1h gtt (141) [x ] needs 3h gtt- elevated      Previous Version   Supervision of high risk pregnancy, antepartum 05/04/2020 by Will Bonnet, MD No   Overview Addendum 09/11/2020  4:04 PM by Homero Fellers, MD     Nursing Staff Provider  Office Location  [facility] Dating   LMP = 10 wk [place]  Language  English Anatomy US  Complete  Flu Vaccine  Declines Genetic Screen   None   TDaP vaccine   reports she had at work 05/2020 Hgb A1C or  GTT Early : elevated 3hr GTT at 20 weeks  Rhogam  Not needed   LAB RESULTS   Feeding Plan  Blood Type O/Positive/-- (11/09 1049)   Contraception  Antibody Negative (11/09 1049)  Circumcision  Rubella 2.41 (11/09 1049)  Pediatrician   RPR Non Reactive (11/09 1049)   Support Person  HBsAg Negative (11/09 1049)   Prenatal Classes  HIV Non Reactive (11/09 1049)    Varicella Non-immune  BTL Consent  GBS  (For PCN allergy, check sensitivities)  VBAC Consent  Pap  2020 NIL     Hgb Electro    Covid Had in Jan 2022 CF      SMA         High Risk Pregnancy Diagnoses Gestational Diabetes- diet controlled Obesity in pregnancy History of Asthma  Antenatal testing: only if medications for management of diabetes is needed Growth [place]: every 4 weeks in third  trimester      Previous Version   Obesity affecting pregnancy 05/04/2020 by Will Bonnet, MD No   Overview Addendum 07/22/2020 11:40 AM by Orlie Pollen, CNM    BMI >=35.0-39.9 [x]  early 1h gtt - 141 - 3h gtt on 07/22/20 [x]  u/s for dating  [ ]  nutritional goals [ ]  folic acid 1mg  [ ]  bASA (>12 weeks) [ ]  consider nutrition consult [ ]  consider maternal EKG 1st trimester [ ]  Growth u/s 28 [ ] , 32 [ ] , 36 weeks [ ]  [ ]  NST/AFI weekly 37+ weeks (37[] , 38[] , 39[] , 40[] ) [ ]  IOL by 41 weeks (scheduled, prn [] )       Previous Version       NST: 140 bpm baseline, moderate variability, 15x15 accelerations, no decelerations.  Glucose log reviewed, continue metformin once a day 5000 mg Taking oral iron, some constipation. Will start on pulmicort 1 puff BID Discussed peak flow meter, patient had one in the past.   Gestational age appropriate obstetric precautions including but not limited to vaginal bleeding, contractions, leaking of fluid and fetal movement were reviewed in detail with the patient.    Return in about 1 week (around 10/15/2020) for ROB/ NST with MD 1 week.  Homero Fellers MD [facility] 10/08/2020, 5:29 PM

## 2020-10-08 NOTE — Patient Instructions (Signed)
Gestational Diabetes Mellitus, Self-Care Caring for yourself after a diagnosis of gestational diabetes mellitus means keeping your blood sugar under control. This can be done through nutrition, exercise, lifestyle changes, insulin and other medicines, and support from your health care team. Your health care provider will set individualized treatment goals for you. What are the risks? If left untreated, gestational diabetes can cause problems for mother and baby. For the mother Women who get gestational diabetes are more likely to:  Have labor induced and deliver early.  Have problems during labor and delivery, if the baby is larger than normal. This includes difficult labor and damage to the birth canal.  Have a cesarean delivery.  Have problems with blood pressure, including high blood pressure and preeclampsia.  Get it again if they become pregnant.  Develop type 2 diabetes in the future. For the baby Gestational diabetes that is not treated can cause the baby to have:  Low blood glucose (hypoglycemia).  Larger-than-normal body size (macrosomia).  Breathing problems. How to monitor blood glucose Check your blood glucose every day and as often as told by your health care provider. To do this: 1. Wash your hands with soap and water for at least 20 seconds. 2. Prick the side of your finger (not the tip) with the lancet. Use a different finger each time. 3. Gently rub the finger until a small drop of blood appears. 4. Follow instructions that come with your meter for inserting the test strip, applying blood to the strip, and getting the result. 5. Write down your result and any notes. Blood glucose goals are:  95 mg/dL (5.3 mmol/L) when fasting.  140 mg/dL (7.8 mmol/L) 1 hour after a meal.  120 mg/dL (6.7 mmol/L) 2 hours after a meal.   Follow these instructions at home: Medicines  Take over-the-counter and prescription medicines only as told by your health care  provider.  If your health care provider prescribed insulin or other diabetes medicines: ? Take them every day. ? Do not run out of insulin or other medicines. Plan ahead so you always have them available. Eating and drinking  Follow instructions from your health care provider about eating or drinking restrictions.  See a diet and nutrition expert (registered dietician) to help you create an eating plan that helps control your diabetes. The foods in this plan will include: ? Lean proteins. ? Complex carbohydrates. These are carbohydrates that contain fiber, have a lot of nutrients, and are digested slowly. They include dried beans, nuts, and whole grain breads, cereals, or pasta. ? Fresh fruits and vegetables. ? Low-fat dairy products. ? Healthy fats.  Eat healthy snacks between nutritious meals.  Drink enough fluid to keep your urine pale yellow.  Keep a record of the carbohydrates that you eat. To do this: ? Read food labels. ? Learn the standard serving sizes of foods.  Make a sick day plan with your health care provider before you get sick. Follow this plan whenever you cannot eat or drink as usual.   Activity  Do exercises as told by your health care provider.  Do 30 or more minutes of physical activity a day, or as much physical activity as your health care provider recommends. It may help to control blood glucose levels after a meal if you: ? Do 10 minutes of exercise after each meal. ? Start this exercise 30 minutes after the meal.  If you start a new exercise or activity, work with your health care provider to adjust your  insulin, other medicines, or food as needed. Lifestyle  Do not drink alcohol.  Do not use any products that contain nicotine or tobacco, such as cigarettes, e-cigarettes, and chewing tobacco. If you need help quitting, ask your health care provider.  Learn to manage stress. If you need help with this, ask your health care provider. Body care  Keep  your vaccines up to date.  Practice good oral hygiene. To do this: ? Clean your teeth and gums two times a day. ? Floss one or more times a day. ? Visit your dentist one or more times every 6 months.  Stay at a healthy weight while you are pregnant. Your expected weight gain depends on your BMI (body mass index) before pregnancy. General instructions  Talk with your health care provider about the risk for high blood pressure during pregnancy (preeclampsia and eclampsia).  Share your diabetes management plan with people in your workplace, school, and household.  Check your urine for ketones when sick and as told by your health care provider. Ketones are made by the liver when a lack of glucose forces the body to use fat for energy.  Carry a medical alert card or wear medical alert jewelry that says you have gestational diabetes.  Keep all follow-up visits. This is important. Get care after delivery  Have your blood glucose level checked with an oral glucose tolerance test (OGTT) 4-12 weeks after delivery.  Get screened for diabetes at least every 3 years, or as often as told by your health care provider. Where to find more information  American Diabetes Association (ADA): diabetes.org  Association of Diabetes Care & Education Specialists (ADCES): diabeteseducator.org  Centers for Disease Control and Prevention (CDC): StoreMirror.com.cy  American Pregnancy Association: americanpregnancy.org  [place] Department of Agriculture MyPlate: http://www.wilson-mendoza.org/ Contact a health care provider if:  Your blood glucose is above your target for two tests in a row.  You have a fever.  You have been sick for 2 days or more and are not getting better.  You have either of these problems for more than 6 hours: ? Vomiting every time you eat or drink. ? Diarrhea. Get help right away if you:  Become confused or cannot think clearly.  Have trouble breathing.  Have moderate or high ketones in your  urine.  Feel your baby is not moving as usual.  Develop unusual discharge or bleeding from your vagina.  Start having early (premature) contractions. Contractions may feel like a tightening in your lower abdomen  Have a severe headache. These symptoms may represent a serious problem that is an emergency. Do not wait to see if the symptoms will go away. Get medical help right away. Call your local emergency services (911 in the [place] Do not drive yourself to the hospital. Summary  Check your blood glucose every day during your pregnancy. Do this as often as told by your health care provider.  Take insulin or other diabetes medicines every day, if your health care provider prescribed them.  Have your blood glucose level checked 4-12 weeks after delivery.  Keep all follow-up visits. This is important. This information is not intended to replace advice given to you by your health care provider. Make sure you discuss any questions you have with your health care provider. Document Revised: 12/30/2019 Document Reviewed: 12/30/2019 Elsevier Patient Education  2021 [employer]. Peak Flow Meter  A peak flow meter is a device that helps you determine how well your asthma is being controlled. The device  measures the flow of air out of your lungs. This is a simple but important tool in daily asthma management. Peak flow meters are available over the counter. The readings from the meter will help you and your health care provider:  Determine how severe your asthma is.  Identify triggers that worsen your asthma.  See how well your current treatment is working.  Figure out when to add or stop certain medicines.  Recognize an asthma attack before signs or symptoms appear.  Decide when to seek emergency care. What are the risks?  Dizziness. Breathing too fast into the peak flow meter may make you dizzy. This could cause you to faint. Take your time so you do not get dizzy or  light-headed.  False readings. If you do not clean your meter, you may get false results. You may not know that your asthma is getting better or getting worse. This may make you start or stop treatment improperly. How to find your personal best Your "personal best" is the highest peak flow rate you can reach when you feel good and have no asthma symptoms. This can be used as your standard for comparing your peak flow meter readings. Because everyone's asthma is different, your personal best will be unique to you. Your health care provider will help you figure out your personal best. Typically, you will measure your peak flow once or twice a day for 2 weeks when you are not having symptoms. The highest reading during the 2-week period is your personal best. Because your lung function can change over time, you should measure your personal best each year. How to use your peak flow meter 1. Move the upper marker to the number that is your personal best. 2. Move the lower marker to the bottom of the numbered scale. 3. Connect the mouthpiece to the peak flow meter. 4. Stand up. 5. Take a deep breath. Make sure you fill your lungs completely. 6. Place your lips tightly around the mouthpiece. Blow as hard and as fast as you can with a single breath (forced exhalation), as if you are blowing out candles. If your lips are not placed tightly around the mouthpiece, you will get incorrect low readings. 7. At the end of your forced exhale, check to see what number the lower marker landed on. This is your peak flow rate. 8. Move the lower marker back to the bottom of the numbered scale. 9. Repeat these steps two more times. Record the highest reading of the three tries in your asthma diary. Do not calculate the average for your three tries. Just record the highest. Measure your peak flow around the same time each day. Always write down the results in your asthma diary. After using your peak flow meter, rest and  breathe slowly and easily. Keep a record of your progress. Your health care provider can give you a simple table to help with this. For the most accurate readings, it is important to keep your peak flow meter clean. Follow the manufacturer's instructions on how to take care of your peak flow meter. Your health care provider will give you instructions on when to do regular monitoring. You may also need to check your peak flow when:  Coughing, wheezing, or other asthma symptoms wake you at night.  Your asthma symptoms worsen during the day.  Your breathing gets worse because of a cold, flu, or other respiratory illness.  You need to use your quick-relief "rescue medicine." It is best to  check your peak flow rate before you use rescue medicine, and again 20-30 minutes afterward. This will help determine whether you need to take the rescue medicine again. How to use your results Use color-coded zones on the meter to see how your peak flow rate compares with your personal best. Your peak flow readings could fall too far below your personal best into the yellow or red zone. If this happens, you will need to take action to prevent or minimize an asthma attack. The color code for each zone reflects increasingly more severe symptoms: Green zone: Good  Peak flow rate between 80% and 100% of your personal best. Your asthma is under good control when your peak flow rate is in the green zone.  When you are in the green zone, you are not likely to be having any asthma symptoms.  Take only your regular, preventive medicine. Your rescue medicine is not needed.   Yellow zone: Caution  Peak flow rate between 50% and 80% of your personal best. Your asthma is getting worse and could be improved.  You may have started coughing, wheezing, or feeling chest tightness. Sometimes, peak flow rates dip down into the yellow zone before asthma symptoms appear.  Consider increasing or changing your asthma medicine. This  may include using your rescue medicine. If you have an asthma action plan, follow each step listed for the yellow zone, including medicine changes.   Red zone: Warning  Peak flow rate below 50% of your personal best. This may mean you are having, or are at risk of having, a medical emergency.  Your coughing, wheezing, or shortness of breath may have become severe. Do not wait to use your rescue medicine. Stop whatever you are doing and use your rescue inhaler, nebulizer, or other medicines to open your airways.  If you have an asthma action plan, follow each step listed for the red zone, including medicine changes and when to seek emergency medical care.   Follow these instructions at home  Take over-the-counter and prescription medicines only as told by your health care provider.  Keep all follow-up visits as told by your health care provider. This is important. Contact a health care provider if:  You are in the yellow zone. If you have an asthma action plan, follow all steps listed under the yellow zone section of the plan. Let your health care provider know that you are in the yellow zone. Get help right away if:  You are in the red zone. If you have an asthma action plan, follow all steps listed under the red zone section of the plan while you get medical care right away. These symptoms may represent a serious problem that is an emergency. Do not wait to see if the symptoms will go away. Get medical help right away. Call your local emergency services (911 in the [place] Do not drive yourself to the hospital. Summary  A peak flow meter is a device that helps you determine how well your asthma is being controlled. The device measures the flow of air out of your lungs.  Readings from the meter will help you determine the severity of your asthma, whether your treatment is working, and when to start or stop treatment.  Measure your personal best every year during periods of no symptoms. The  meter will compare this reading with your regular readings to determine your condition at a given time.  Measure your peak flow around the same time each day. Always write  down the results of your peak flow readings in your asthma diary. Keep a record of your progress.  Work with your health care provider to understand what each zone (green, yellow, red) means and what actions to take when your peak flow falls in each of these zones. This information is not intended to replace advice given to you by your health care provider. Make sure you discuss any questions you have with your health care provider. Document Revised: 07/22/2019 Document Reviewed: 07/22/2019 Elsevier Patient Education  2021 [place]. http://www.aaaai.org/conditions-and-treatments/asthma">  Asthma, Adult  Asthma is a long-term (chronic) condition that causes recurrent episodes in which the airways become tight and narrow. The airways are the passages that lead from the nose and mouth down into the lungs. Asthma episodes, also called asthma attacks, can cause coughing, wheezing, shortness of breath, and chest pain. The airways can also fill with mucus. During an attack, it can be difficult to breathe. Asthma attacks can range from minor to life threatening. Asthma cannot be cured, but medicines and lifestyle changes can help control it and treat acute attacks. What are the causes? This condition is believed to be caused by inherited (genetic) and environmental factors, but its exact cause is not known. There are many things that can bring on an asthma attack or make asthma symptoms worse (triggers). Asthma triggers are different for each person. Common triggers include:  Mold.  Dust.  Cigarette smoke.  Cockroaches.  Things that can cause allergy symptoms (allergens), such as animal dander or pollen from trees or grass.  Air pollutants such as household cleaners, wood smoke, smog, or [employer] account planner.  Cold air, weather  changes, and winds (which increase molds and pollen in the air).  Strong emotional expressions such as crying or laughing hard.  Stress.  Certain medicines (such as aspirin) or types of medicines (such as beta-blockers).  Sulfites in foods and drinks. Foods and drinks that may contain sulfites include dried fruit, potato chips, and sparkling grape juice.  Infections or inflammatory conditions such as the flu, a cold, or inflammation of the nasal membranes (rhinitis).  Gastroesophageal reflux disease (GERD).  Exercise or strenuous activity. What are the signs or symptoms? Symptoms of this condition may occur right after asthma is triggered or many hours later. Symptoms include:  Wheezing. This can sound like whistling when you breathe.  Excessive nighttime or early morning coughing.  Frequent or severe coughing with a common cold.  Chest tightness.  Shortness of breath.  Tiredness (fatigue) with minimal activity. How is this diagnosed? This condition is diagnosed based on:  Your medical history.  A physical exam.  Tests, which may include: ? Lung function studies and pulmonary studies (spirometry). These tests can evaluate the flow of air in your lungs. ? Allergy tests. ? Imaging tests, such as X-rays. How is this treated? There is no cure for this condition, but treatment can help control your symptoms. Treatment for asthma usually involves:  Identifying and avoiding your asthma triggers.  Using medicines to control your symptoms. Generally, two types of medicines are used to treat asthma: ? Controller medicines. These help prevent asthma symptoms from occurring. They are usually taken every day. ? Fast-acting reliever or rescue medicines. These quickly relieve asthma symptoms by widening the narrow and tight airways. They are used as needed and provide short-term relief.  Using supplemental oxygen. This may be needed during a severe episode.  Using other medicines,  such as: ? Allergy medicines, such as antihistamines, if your  asthma attacks are triggered by allergens. ? Immune medicines (immunomodulators). These are medicines that help control the immune system.  Creating an asthma action plan. An asthma action plan is a written plan for managing and treating your asthma attacks. This plan includes: ? A list of your asthma triggers and how to avoid them. ? Information about when medicines should be taken and when their dosage should be changed. ? Instructions about using a device called a peak flow meter. A peak flow meter measures how well the lungs are working and the severity of your asthma. It helps you monitor your condition. Follow these instructions at home: Controlling your home environment Control your home environment in the following ways to help avoid triggers and prevent asthma attacks:  Change your heating and air conditioning filter regularly.  Limit your use of fireplaces and wood stoves.  Get rid of pests (such as roaches and mice) and their droppings.  Throw away plants if you see mold on them.  Clean floors and dust surfaces regularly. Use unscented cleaning products.  Try to have someone else vacuum for you regularly. Stay out of rooms while they are being vacuumed and for a short while afterward. If you vacuum, use a dust mask from a hardware store, a double-layered or microfilter vacuum cleaner bag, or a vacuum cleaner with a HEPA filter.  Replace carpet with wood, tile, or vinyl flooring. Carpet can trap dander and dust.  Use allergy-proof pillows, mattress covers, and box spring covers.  Keep your bedroom a trigger-free room.  Avoid pets and keep windows closed when allergens are in the air.  Wash beddings every week in hot water and dry them in a dryer.  Use blankets that are made of polyester or cotton.  Clean bathrooms and kitchens with bleach. If possible, have someone repaint the walls in these rooms with  mold-resistant paint. Stay out of the rooms that are being cleaned and painted.  Wash your hands often with soap and water. If soap and water are not available, use hand sanitizer.  Do not allow anyone to smoke in your home. General instructions  Take over-the-counter and prescription medicines only as told by your health care provider. ? Speak with your health care provider if you have questions about how or when to take the medicines. ? Make note if you are requiring more frequent dosages.  Do not use any products that contain nicotine or tobacco, such as cigarettes and e-cigarettes. If you need help quitting, ask your health care provider. Also, avoid being exposed to secondhand smoke.  Use a peak flow meter as told by your health care provider. Record and keep track of the readings.  Understand and use the asthma action plan to help minimize, or stop an asthma attack, without needing to seek medical care.  Make sure you stay up to date on your yearly vaccinations as told by your health care provider. This may include vaccines for the flu and pneumonia.  Avoid outdoor activities when allergen counts are high and when air quality is low.  Wear a ski mask that covers your nose and mouth during outdoor winter activities. Exercise indoors on cold days if you can.  Warm up before exercising, and take time for a cool-down period after exercise.  Keep all follow-up visits as told by your health care provider. This is important. Where to find more information  For information about asthma, turn to the Centers for Disease Control and Prevention at http://www.clark.net/  For  air quality information, turn to [employer] at https://www.miller-reyes.info/ Contact a health care provider if:  You have wheezing, shortness of breath, or a cough even while you are taking medicine to prevent attacks.  The mucus you cough up (sputum) is thicker than usual.  Your sputum changes from clear or white to yellow, green,  gray, or bloody.  Your medicines are causing side effects, such as a rash, itching, swelling, or trouble breathing.  You need to use a reliever medicine more than 2-3 times a week.  Your peak flow reading is still at 50-79% of your personal best after following your action plan for 1 hour.  You have a fever. Get help right away if:  You are getting worse and do not respond to treatment during an asthma attack.  You are short of breath when at rest or when doing very little physical activity.  You have difficulty eating, drinking, or talking.  You have chest pain or tightness.  You develop a fast heartbeat or palpitations.  You have a bluish color to your lips or fingernails.  You are light-headed or dizzy, or you faint.  Your peak flow reading is less than 50% of your personal best.  You feel too tired to breathe normally. Summary  Asthma is a long-term (chronic) condition that causes recurrent episodes in which the airways become tight and narrow. These episodes can cause coughing, wheezing, shortness of breath, and chest pain.  Asthma cannot be cured, but medicines and lifestyle changes can help control it and treat acute attacks.  Make sure you understand how to avoid triggers and how and when to use your medicines.  Asthma attacks can range from minor to life threatening. Get help right away if you have an asthma attack and do not respond to treatment with your usual rescue medicines. This information is not intended to replace advice given to you by your health care provider. Make sure you discuss any questions you have with your health care provider. Document Revised: 04/24/2020 Document Reviewed: 11/27/2019 Elsevier Patient Education  2021 [employer].

## 2020-10-13 ENCOUNTER — Telehealth: Payer: Self-pay

## 2020-10-13 NOTE — Telephone Encounter (Signed)
Pt calling; rx waiting on approval needed.  [phone]  Pt states ins doesn't cover Budesonide (Pulmicort Flexhaler).  Is there something else similar?  [facility] [place].

## 2020-10-15 ENCOUNTER — Encounter: Payer: Medicaid Other | Admitting: Obstetrics and Gynecology

## 2020-10-15 DIAGNOSIS — F331 Major depressive disorder, recurrent, moderate: Secondary | ICD-10-CM | POA: Diagnosis not present

## 2020-10-15 DIAGNOSIS — F411 Generalized anxiety disorder: Secondary | ICD-10-CM | POA: Diagnosis not present

## 2020-10-16 ENCOUNTER — Encounter: Payer: Medicaid Other | Admitting: Obstetrics and Gynecology

## 2020-10-21 ENCOUNTER — Ambulatory Visit (INDEPENDENT_AMBULATORY_CARE_PROVIDER_SITE_OTHER): Payer: Medicaid Other | Admitting: Advanced Practice Midwife

## 2020-10-21 ENCOUNTER — Encounter: Payer: Self-pay | Admitting: Advanced Practice Midwife

## 2020-10-21 ENCOUNTER — Other Ambulatory Visit: Payer: Self-pay

## 2020-10-21 VITALS — BP 100/70 | Wt 215.0 lb

## 2020-10-21 DIAGNOSIS — O24415 Gestational diabetes mellitus in pregnancy, controlled by oral hypoglycemic drugs: Secondary | ICD-10-CM

## 2020-10-21 DIAGNOSIS — Z3A34 34 weeks gestation of pregnancy: Secondary | ICD-10-CM

## 2020-10-21 DIAGNOSIS — O0993 Supervision of high risk pregnancy, unspecified, third trimester: Secondary | ICD-10-CM

## 2020-10-21 DIAGNOSIS — Z369 Encounter for antenatal screening, unspecified: Secondary | ICD-10-CM

## 2020-10-21 LAB — POCT URINALYSIS DIPSTICK OB: Glucose, UA: NEGATIVE

## 2020-10-21 LAB — FETAL NONSTRESS TEST

## 2020-10-21 NOTE — Progress Notes (Addendum)
Routine Prenatal Care Visit  Subjective  Caitlin Ortega is a 35 y.o. G2P1 at [redacted]w[redacted]d being seen today for ongoing prenatal care.  She is currently monitored for the following issues for this high-risk pregnancy and has Supervision of high risk pregnancy, antepartum; Obesity affecting pregnancy; Abnormal glucose tolerance test (GTT) during pregnancy, antepartum; Asthma affecting pregnancy in second trimester; Gestational diabetes mellitus (GDM) in third trimester; Abdominal pain in pregnancy, second trimester; Shortness of breath due to pregnancy in third trimester; SOB (shortness of breath); Asthma; COVID-19 virus infection; Microcytic anemia; and Pregnancy on their problem list.  ----------------------------------------------------------------------------------- Patient reports no complaints. She did not bring her glucose log. Her fasting levels are usually in the 80s and not above 95. Her after meal levels are usually between 120 to 130. She admits eating a lot of popsicles. We reviewed carb modified diet/increased activity and bring BS log to visits. She takes metformin in AM. She reports one of the two prescribed inhalers from last visit was not covered by insurance. Will wait to see if prescribing MD has followed up on that.  Contractions: Not present. Vag. Bleeding: None.  Movement: Present. Leaking Fluid denies.  ----------------------------------------------------------------------------------- The following portions of the patient's history were reviewed and updated as appropriate: allergies, current medications, past family history, past medical history, past social history, past surgical history and problem list. Problem list updated.  Objective  Blood pressure 100/70, weight 215 lb (97.5 kg), last menstrual period 02/22/2020. Pregravid weight 189 lb (85.7 kg) Total Weight Gain 26 lb (11.8 kg) Urinalysis: Urine Protein Moderate (2+)  Urine Glucose Negative  Fetal Status: Fetal Heart Rate  (bpm): 150   Movement: Present      NST: 150 bpm, moderate variability, +accelerations, -decelerations  General:  Alert, oriented and cooperative. Patient is in no acute distress.  Skin: Skin is warm and dry. No rash noted.   Cardiovascular: Normal heart rate noted  Respiratory: Normal respiratory effort, no problems with respiration noted  Abdomen: Soft, gravid, appropriate for gestational age. Pain/Pressure: Present     Pelvic:  Cervical exam deferred        Extremities: Normal range of motion.  Edema: None  Mental Status: Normal mood and affect. Normal behavior. Normal judgment and thought content.   Assessment   35 y.o. G2P1 at [redacted]w[redacted]d by  11/28/2020, by Last Menstrual Period presenting for routine prenatal visit  Plan   pregnancy Problems (from 05/04/20 to present)    Problem Noted Resolved   Gestational diabetes mellitus (GDM) in third trimester 07/27/2020 by Homero Fellers, MD No   Abnormal glucose tolerance test (GTT) during pregnancy, antepartum 07/07/2020 by Will Bonnet, MD No   Overview Addendum 07/27/2020 12:16 PM by Homero Fellers, MD    - failed early 1h gtt (141) [x ] needs 3h gtt- elevated      Previous Version   Supervision of high risk pregnancy, antepartum 05/04/2020 by Will Bonnet, MD No   Overview Addendum 09/11/2020  4:04 PM by Homero Fellers, MD     Nursing Staff Provider  Office Location  [facility] Dating   LMP = 10 wk [place]  Language  English Anatomy US  Complete  Flu Vaccine  Declines Genetic Screen   None   TDaP vaccine   reports she had at work 05/2020 Hgb A1C or  GTT Early : elevated 3hr GTT at 20 weeks  Rhogam  Not needed   LAB RESULTS   Feeding Plan  Blood Type O/Positive/-- (11/09  1049)   Contraception  Antibody Negative (11/09 1049)  Circumcision  Rubella 2.41 (11/09 1049)  Pediatrician   RPR Non Reactive (11/09 1049)   Support Person  HBsAg Negative (11/09 1049)   Prenatal Classes  HIV Non Reactive (11/09 1049)     Varicella Non-immune  BTL Consent  GBS  (For PCN allergy, check sensitivities)        VBAC Consent  Pap  2020 NIL     Hgb Electro    Covid Had in Jan 2022 CF      SMA         High Risk Pregnancy Diagnoses Gestational Diabetes- diet controlled Obesity in pregnancy History of Asthma  Antenatal testing: only if medications for management of diabetes is needed Growth [place]: every 4 weeks in third trimester      Previous Version   Obesity affecting pregnancy 05/04/2020 by Will Bonnet, MD No   Overview Addendum 07/22/2020 11:40 AM by Orlie Pollen, CNM    BMI >=35.0-39.9 [x]  early 1h gtt - 141 - 3h gtt on 07/22/20 [x]  u/s for dating  [ ]  nutritional goals [ ]  folic acid 1mg  [ ]  bASA (>12 weeks) [ ]  consider nutrition consult [ ]  consider maternal EKG 1st trimester [ ]  Growth u/s 28 [ ] , 32 [ ] , 36 weeks [ ]  [ ]  NST/AFI weekly 37+ weeks (37[] , 38[] , 39[] , 40[] ) [ ]  IOL by 41 weeks (scheduled, prn [] )       Previous Version    BS goals:  Fasting per patient report are wnl  After meals is slightly elevated in the 120s Patient is encouraged to modify diet/exercise and bring log to next visit Medication may need adjusting  Preterm labor symptoms and general obstetric precautions including but not limited to vaginal bleeding, contractions, leaking of fluid and fetal movement were reviewed in detail with the patient. Please refer to After Visit Summary for other counseling recommendations.   Return in about 1 week (around 10/28/2020) for afi/nst/rob with MD.  Rod Can, CNM 10/21/2020 12:10 PM

## 2020-10-22 DIAGNOSIS — F331 Major depressive disorder, recurrent, moderate: Secondary | ICD-10-CM | POA: Diagnosis not present

## 2020-10-22 DIAGNOSIS — F411 Generalized anxiety disorder: Secondary | ICD-10-CM | POA: Diagnosis not present

## 2020-10-28 ENCOUNTER — Other Ambulatory Visit: Payer: Self-pay

## 2020-10-28 ENCOUNTER — Ambulatory Visit (INDEPENDENT_AMBULATORY_CARE_PROVIDER_SITE_OTHER): Payer: Medicaid Other | Admitting: Obstetrics and Gynecology

## 2020-10-28 ENCOUNTER — Ambulatory Visit (INDEPENDENT_AMBULATORY_CARE_PROVIDER_SITE_OTHER): Payer: Medicaid Other

## 2020-10-28 ENCOUNTER — Encounter: Payer: Self-pay | Admitting: Obstetrics and Gynecology

## 2020-10-28 ENCOUNTER — Other Ambulatory Visit: Payer: Self-pay | Admitting: Advanced Practice Midwife

## 2020-10-28 VITALS — BP 120/72 | Ht 65.0 in | Wt 222.0 lb

## 2020-10-28 DIAGNOSIS — O24415 Gestational diabetes mellitus in pregnancy, controlled by oral hypoglycemic drugs: Secondary | ICD-10-CM | POA: Diagnosis not present

## 2020-10-28 DIAGNOSIS — O0990 Supervision of high risk pregnancy, unspecified, unspecified trimester: Secondary | ICD-10-CM

## 2020-10-28 DIAGNOSIS — Z3A37 37 weeks gestation of pregnancy: Secondary | ICD-10-CM | POA: Diagnosis not present

## 2020-10-28 DIAGNOSIS — O99513 Diseases of the respiratory system complicating pregnancy, third trimester: Secondary | ICD-10-CM

## 2020-10-28 DIAGNOSIS — O0993 Supervision of high risk pregnancy, unspecified, third trimester: Secondary | ICD-10-CM | POA: Diagnosis not present

## 2020-10-28 DIAGNOSIS — Z369 Encounter for antenatal screening, unspecified: Secondary | ICD-10-CM

## 2020-10-28 DIAGNOSIS — J45909 Unspecified asthma, uncomplicated: Secondary | ICD-10-CM

## 2020-10-28 LAB — FETAL NONSTRESS TEST

## 2020-10-28 MED ORDER — FLUTICASONE PROPIONATE HFA 110 MCG/ACT IN AERO: 1.0000 | INHALATION_SPRAY | Freq: Two times a day (BID) | RESPIRATORY_TRACT | 12 refills | Status: DC

## 2020-10-28 NOTE — Progress Notes (Signed)
Routine Prenatal Care Visit  Subjective  Caitlin Ortega is a 35 y.o. G2P1 at [redacted]w[redacted]d being seen today for ongoing prenatal care.  She is currently monitored for the following issues for this high-risk pregnancy and has Supervision of high risk pregnancy, antepartum; Obesity affecting pregnancy; Abnormal glucose tolerance test (GTT) during pregnancy, antepartum; Asthma affecting pregnancy in second trimester; Gestational diabetes mellitus (GDM) in third trimester; Abdominal pain in pregnancy, second trimester; Shortness of breath due to pregnancy in third trimester; SOB (shortness of breath); Asthma; COVID-19 virus infection; Microcytic anemia; and Pregnancy on their problem list.  ----------------------------------------------------------------------------------- Patient reports no complaints.   Contractions: Not present. Vag. Bleeding: None.  Movement: Present. Denies leaking of fluid.  ----------------------------------------------------------------------------------- The following portions of the patient's history were reviewed and updated as appropriate: allergies, current medications, past family history, past medical history, past social history, past surgical history and problem list. Problem list updated.   Objective  Blood pressure 120/72, height 5\' 5"  (1.651 m), weight 222 lb (100.7 kg), last menstrual period 02/22/2020. Pregravid weight 189 lb (85.7 kg) Total Weight Gain 33 lb (15 kg) Urinalysis:      Fetal Status: Fetal Heart Rate (bpm): 130   Movement: Present     General:  Alert, oriented and cooperative. Patient is in no acute distress.  Skin: Skin is warm and dry. No rash noted.   Cardiovascular: Normal heart rate noted  Respiratory: Normal respiratory effort, no problems with respiration noted  Abdomen: Soft, gravid, appropriate for gestational age. Pain/Pressure: Absent     Pelvic:  Cervical exam deferred        Extremities: Normal range of motion.  Edema: None   Mental Status: Normal mood and affect. Normal behavior. Normal judgment and thought content.     Assessment   35 y.o. G2P1 at [redacted]w[redacted]d by  11/28/2020, by Last Menstrual Period presenting for routine prenatal visit  Plan   pregnancy Problems (from 05/04/20 to present)    Problem Noted Resolved   Gestational diabetes mellitus (GDM) in third trimester 07/27/2020 by Homero Fellers, MD No   Abnormal glucose tolerance test (GTT) during pregnancy, antepartum 07/07/2020 by Will Bonnet, MD No   Overview Addendum 07/27/2020 12:16 PM by Homero Fellers, MD    - failed early 1h gtt (141) [x ] needs 3h gtt- elevated      Previous Version   Supervision of high risk pregnancy, antepartum 05/04/2020 by Will Bonnet, MD No   Overview Addendum 09/11/2020  4:04 PM by Homero Fellers, MD     Nursing Staff Provider  Office Location  [facility] Dating   LMP = 10 wk [place]  Language  English Anatomy US  Complete  Flu Vaccine  Declines Genetic Screen   None   TDaP vaccine   reports she had at work 05/2020 Hgb A1C or  GTT Early : elevated 3hr GTT at 20 weeks  Rhogam  Not needed   LAB RESULTS   Feeding Plan  Blood Type O/Positive/-- (11/09 1049)   Contraception  Antibody Negative (11/09 1049)  Circumcision  Rubella 2.41 (11/09 1049)  Pediatrician   RPR Non Reactive (11/09 1049)   Support Person  HBsAg Negative (11/09 1049)   Prenatal Classes  HIV Non Reactive (11/09 1049)    Varicella Non-immune  BTL Consent  GBS  (For PCN allergy, check sensitivities)        VBAC Consent  Pap  2020 NIL     Hgb Electro  Covid Had in Jan 2022 CF      SMA         High Risk Pregnancy Diagnoses Gestational Diabetes- diet controlled Obesity in pregnancy History of Asthma  Antenatal testing: only if medications for management of diabetes is needed Growth [place]: every 4 weeks in third trimester      Previous Version   Obesity affecting pregnancy 05/04/2020 by Will Bonnet, MD No    Overview Addendum 07/22/2020 11:40 AM by Orlie Pollen, CNM    BMI >=35.0-39.9 [x]  early 1h gtt - 141 - 3h gtt on 07/22/20 [x]  u/s for dating  [ ]  nutritional goals [ ]  folic acid 1mg  [ ]  bASA (>12 weeks) [ ]  consider nutrition consult [ ]  consider maternal EKG 1st trimester [ ]  Growth u/s 28 [ ] , 32 [ ] , 36 weeks [ ]  [ ]  NST/AFI weekly 37+ weeks (37[] , 38[] , 39[] , 40[] ) [ ]  IOL by 41 weeks (scheduled, prn [] )       Previous Version       NST: 135 bpm baseline, moderate variability, 15x15 accelerations, no decelerations. Reactive  Sent new rx for inhaler. Reviewed glucose log which was WNL. Continue with metformin 500 mg.   Gestational age appropriate obstetric precautions including but not limited to vaginal bleeding, contractions, leaking of fluid and fetal movement were reviewed in detail with the patient.    Return in about 1 week (around 11/04/2020) for ROB in person with MD and NST.  Homero Fellers MD [facility], [facility] 10/28/2020, 4:25 PM

## 2020-10-28 NOTE — Patient Instructions (Signed)

## 2020-11-04 ENCOUNTER — Ambulatory Visit (INDEPENDENT_AMBULATORY_CARE_PROVIDER_SITE_OTHER): Payer: Medicaid Other | Admitting: Obstetrics and Gynecology

## 2020-11-04 ENCOUNTER — Other Ambulatory Visit (HOSPITAL_COMMUNITY)
Admission: RE | Admit: 2020-11-04 | Discharge: 2020-11-04 | Disposition: A | Discharge status: Home / Self Care | Payer: Medicaid Other | Source: Ambulatory Visit | Attending: Obstetrics and Gynecology | Admitting: Obstetrics and Gynecology

## 2020-11-04 ENCOUNTER — Encounter: Payer: Self-pay | Admitting: Obstetrics and Gynecology

## 2020-11-04 ENCOUNTER — Other Ambulatory Visit: Payer: Self-pay

## 2020-11-04 VITALS — BP 120/72 | Wt 222.6 lb

## 2020-11-04 DIAGNOSIS — Z3A36 36 weeks gestation of pregnancy: Secondary | ICD-10-CM

## 2020-11-04 DIAGNOSIS — O0993 Supervision of high risk pregnancy, unspecified, third trimester: Secondary | ICD-10-CM | POA: Insufficient documentation

## 2020-11-04 DIAGNOSIS — O24415 Gestational diabetes mellitus in pregnancy, controlled by oral hypoglycemic drugs: Secondary | ICD-10-CM

## 2020-11-04 DIAGNOSIS — J45909 Unspecified asthma, uncomplicated: Secondary | ICD-10-CM

## 2020-11-04 DIAGNOSIS — O99513 Diseases of the respiratory system complicating pregnancy, third trimester: Secondary | ICD-10-CM

## 2020-11-04 LAB — FETAL NONSTRESS TEST

## 2020-11-04 NOTE — Patient Instructions (Addendum)

## 2020-11-04 NOTE — Progress Notes (Signed)
Routine Prenatal Care Visit  Subjective  Caitlin Ortega is a 35 y.o. G2P1 at [redacted]w[redacted]d being seen today for ongoing prenatal care.  She is currently monitored for the following issues for this high-risk pregnancy and has Supervision of high risk pregnancy, antepartum; Obesity affecting pregnancy; Abnormal glucose tolerance test (GTT) during pregnancy, antepartum; Asthma affecting pregnancy in second trimester; Gestational diabetes mellitus (GDM) in third trimester; Abdominal pain in pregnancy, second trimester; Shortness of breath due to pregnancy in third trimester; SOB (shortness of breath); Asthma; COVID-19 virus infection; Microcytic anemia; and Pregnancy on their problem list.  ----------------------------------------------------------------------------------- Patient reports discomfort.   Contractions: Not present. Vag. Bleeding: None.  Movement: Present. Denies leaking of fluid.  ----------------------------------------------------------------------------------- The following portions of the patient's history were reviewed and updated as appropriate: allergies, current medications, past family history, past medical history, past social history, past surgical history and problem list. Problem list updated.   Objective  Blood pressure 120/72, weight 222 lb 9.6 oz (101 kg), last menstrual period 02/22/2020. Pregravid weight 189 lb (85.7 kg) Total Weight Gain 33 lb 9.6 oz (15.2 kg) Urinalysis:      Fetal Status: Fetal Heart Rate (bpm): 140   Movement: Present  Presentation: Vertex  General:  Alert, oriented and cooperative. Patient is in no acute distress.  Skin: Skin is warm and dry. No rash noted.   Cardiovascular: Normal heart rate noted  Respiratory: Normal respiratory effort, no problems with respiration noted  Abdomen: Soft, gravid, appropriate for gestational age. Pain/Pressure: Present     Pelvic:  Cervical exam performed Dilation: Fingertip Effacement (%): 0 Station: -3   Extremities: Normal range of motion.  Edema: None  Mental Status: Normal mood and affect. Normal behavior. Normal judgment and thought content.     Assessment   35 y.o. G2P1 at [redacted]w[redacted]d by  11/28/2020, by Last Menstrual Period presenting for routine prenatal visit  Plan   pregnancy Problems (from 05/04/20 to present)    Problem Noted Resolved   Gestational diabetes mellitus (GDM) in third trimester 07/27/2020 by Homero Fellers, MD No   Abnormal glucose tolerance test (GTT) during pregnancy, antepartum 07/07/2020 by Will Bonnet, MD No   Overview Addendum 07/27/2020 12:16 PM by Homero Fellers, MD    - failed early 1h gtt (141) [x ] needs 3h gtt- elevated      Previous Version   Supervision of high risk pregnancy, antepartum 05/04/2020 by Will Bonnet, MD No   Overview Addendum 10/28/2020  4:27 PM by Homero Fellers, MD     Nursing Staff Provider  Office Location  [facility] Dating   LMP = 10 wk [place]  Language  English Anatomy US  Complete  Flu Vaccine  Declines Genetic Screen   None   TDaP vaccine   reports she had at work 05/2020 Hgb A1C or  GTT Early : elevated 3hr GTT at 20 weeks  Rhogam  Not needed   LAB RESULTS   Feeding Plan  Blood Type O/Positive/-- (11/09 1049)   Contraception  Antibody Negative (11/09 1049)  Circumcision  Rubella 2.41 (11/09 1049)  Pediatrician   RPR Non Reactive (11/09 1049)   Support Person  HBsAg Negative (11/09 1049)   Prenatal Classes  HIV Non Reactive (11/09 1049)    Varicella Non-immune  BTL Consent  GBS  (For PCN allergy, check sensitivities)        VBAC Consent  Pap  2020 NIL     Hgb Electro  Covid Had in Jan 2022 CF      SMA         High Risk Pregnancy Diagnoses Gestational Diabetes- metformin Obesity in pregnancy History of Asthma  Antenatal testing: 1-2 times weekly  Growth [place]: every 4 weeks in third trimester      Previous Version   Obesity affecting pregnancy 05/04/2020 by Will Bonnet, MD  No   Overview Addendum 07/22/2020 11:40 AM by Orlie Pollen, CNM    BMI >=35.0-39.9 [x]  early 1h gtt - 141 - 3h gtt on 07/22/20 [x]  u/s for dating  [ ]  nutritional goals [ ]  folic acid 1mg  [ ]  bASA (>12 weeks) [ ]  consider nutrition consult [ ]  consider maternal EKG 1st trimester [ ]  Growth u/s 28 [ ] , 32 [ ] , 36 weeks [ ]  [ ]  NST/AFI weekly 37+ weeks (37[] , 38[] , 39[] , 40[] ) [ ]  IOL by 41 weeks (scheduled, prn [] )       Previous Version       Discussed that delivery before 39 weeks was not recommended per MFM Dr. Donalee Citrin unless patient is having uncontrolled glucose values. He suggested repeating growth at 38 weeks.   Patient has been reporting mostly normal glucose values. Highest postprandial this week 129.  She would like to discuss this further with [facility] because "my sister was delivered at 74 weeks for diabetes. " She did not bring a glucose log this week.  Encouraged to send through mychart.  Still taking metformin 500 mg once a day.   NST: 140 bpm baseline, moderate variability, 15x15 accelerations, no decelerations. Reactive  Gestational age appropriate obstetric precautions including but not limited to vaginal bleeding, contractions, leaking of fluid and fetal movement were reviewed in detail with the patient.    Return in about 1 week (around 11/11/2020) for ROB with MD, NST.  Homero Fellers MD [facility] 11/04/2020, 2:56 PM

## 2020-11-05 DIAGNOSIS — F411 Generalized anxiety disorder: Secondary | ICD-10-CM | POA: Diagnosis not present

## 2020-11-05 DIAGNOSIS — F331 Major depressive disorder, recurrent, moderate: Secondary | ICD-10-CM | POA: Diagnosis not present

## 2020-11-06 LAB — CERVICOVAGINAL ANCILLARY ONLY
Chlamydia: NEGATIVE
Comment: NEGATIVE
Comment: NORMAL
Neisseria Gonorrhea: NEGATIVE

## 2020-11-09 LAB — CULTURE, BETA STREP (GROUP B ONLY): Strep Gp B Culture: NEGATIVE

## 2020-11-10 ENCOUNTER — Other Ambulatory Visit: Payer: Self-pay

## 2020-11-10 ENCOUNTER — Ambulatory Visit (INDEPENDENT_AMBULATORY_CARE_PROVIDER_SITE_OTHER): Payer: Medicaid Other | Admitting: Obstetrics & Gynecology

## 2020-11-10 ENCOUNTER — Observation Stay
Admission: EM | Admit: 2020-11-10 | Discharge: 2020-11-10 | Disposition: A | Discharge status: Home / Self Care | Payer: Medicaid Other | Attending: Obstetrics and Gynecology | Admitting: Obstetrics and Gynecology

## 2020-11-10 ENCOUNTER — Encounter: Payer: Self-pay | Admitting: Obstetrics & Gynecology

## 2020-11-10 ENCOUNTER — Observation Stay: Payer: Medicaid Other

## 2020-11-10 ENCOUNTER — Encounter: Payer: Self-pay | Admitting: Obstetrics and Gynecology

## 2020-11-10 VITALS — BP 120/70 | Wt 225.0 lb

## 2020-11-10 DIAGNOSIS — O368331 Maternal care for abnormalities of the fetal heart rate or rhythm, third trimester, fetus 1: Secondary | ICD-10-CM | POA: Diagnosis not present

## 2020-11-10 DIAGNOSIS — Z8616 Personal history of COVID-19: Secondary | ICD-10-CM | POA: Diagnosis not present

## 2020-11-10 DIAGNOSIS — J45909 Unspecified asthma, uncomplicated: Secondary | ICD-10-CM | POA: Diagnosis not present

## 2020-11-10 DIAGNOSIS — Z87891 Personal history of nicotine dependence: Secondary | ICD-10-CM | POA: Diagnosis not present

## 2020-11-10 DIAGNOSIS — O0990 Supervision of high risk pregnancy, unspecified, unspecified trimester: Secondary | ICD-10-CM

## 2020-11-10 DIAGNOSIS — Z3A37 37 weeks gestation of pregnancy: Secondary | ICD-10-CM | POA: Diagnosis not present

## 2020-11-10 DIAGNOSIS — O24919 Unspecified diabetes mellitus in pregnancy, unspecified trimester: Secondary | ICD-10-CM

## 2020-11-10 DIAGNOSIS — Z7984 Long term (current) use of oral hypoglycemic drugs: Secondary | ICD-10-CM | POA: Insufficient documentation

## 2020-11-10 DIAGNOSIS — O99213 Obesity complicating pregnancy, third trimester: Secondary | ICD-10-CM

## 2020-11-10 DIAGNOSIS — O99513 Diseases of the respiratory system complicating pregnancy, third trimester: Secondary | ICD-10-CM | POA: Insufficient documentation

## 2020-11-10 DIAGNOSIS — R0600 Dyspnea, unspecified: Secondary | ICD-10-CM | POA: Insufficient documentation

## 2020-11-10 DIAGNOSIS — O99810 Abnormal glucose complicating pregnancy: Secondary | ICD-10-CM

## 2020-11-10 DIAGNOSIS — O288 Other abnormal findings on antenatal screening of mother: Secondary | ICD-10-CM | POA: Diagnosis present

## 2020-11-10 DIAGNOSIS — O24425 Gestational diabetes mellitus in childbirth, controlled by oral hypoglycemic drugs: Secondary | ICD-10-CM | POA: Diagnosis not present

## 2020-11-10 DIAGNOSIS — O24415 Gestational diabetes mellitus in pregnancy, controlled by oral hypoglycemic drugs: Secondary | ICD-10-CM

## 2020-11-10 LAB — URINALYSIS, COMPLETE (UACMP) WITH MICROSCOPIC
Bacteria, UA: NONE SEEN
Bilirubin Urine: NEGATIVE
Glucose, UA: NEGATIVE mg/dL
Hgb urine dipstick: NEGATIVE
Ketones, ur: NEGATIVE mg/dL
Nitrite: NEGATIVE
Protein, ur: NEGATIVE mg/dL
Specific Gravity, Urine: 1.013 (ref 1.005–1.030)
pH: 6 (ref 5.0–8.0)

## 2020-11-10 LAB — BASIC METABOLIC PANEL
Anion gap: 9 (ref 5–15)
BUN: 7 mg/dL (ref 6–20)
CO2: 20 mmol/L — ABNORMAL LOW (ref 22–32)
Calcium: 9.3 mg/dL (ref 8.9–10.3)
Chloride: 106 mmol/L (ref 98–111)
Creatinine, Ser: 0.49 mg/dL (ref 0.44–1.00)
GFR, Estimated: >60 mL/min (ref >60)
Glucose, Bld: 68 mg/dL — ABNORMAL LOW (ref 70–99)
Potassium: 4 mmol/L (ref 3.5–5.1)
Sodium: 135 mmol/L (ref 135–145)

## 2020-11-10 LAB — HEMOGLOBIN A1C
Hgb A1c MFr Bld: 5.2 % (ref 4.8–5.6)
Mean Plasma Glucose: 102.54 mg/dL

## 2020-11-10 LAB — CBC
HCT: 32.5 % — ABNORMAL LOW (ref 36.0–46.0)
Hemoglobin: 10.3 g/dL — ABNORMAL LOW (ref 12.0–15.0)
MCH: 24.3 pg — ABNORMAL LOW (ref 26.0–34.0)
MCHC: 31.7 g/dL (ref 30.0–36.0)
MCV: 76.7 fL — ABNORMAL LOW (ref 80.0–100.0)
Platelets: 254 10*3/uL (ref 150–400)
RBC: 4.24 MIL/uL (ref 3.87–5.11)
RDW: 18.7 % — ABNORMAL HIGH (ref 11.5–15.5)
WBC: 9 10*3/uL (ref 4.0–10.5)
nRBC: 0 % (ref 0.0–0.2)

## 2020-11-10 LAB — BETA-HYDROXYBUTYRIC ACID: Beta-Hydroxybutyric Acid: 0.16 mmol/L (ref 0.05–0.27)

## 2020-11-10 LAB — BRAIN NATRIURETIC PEPTIDE: B Natriuretic Peptide: 16.3 pg/mL (ref 0.0–100.0)

## 2020-11-10 MED ORDER — ACETAMINOPHEN 325 MG PO TABS: 650.0000 mg | ORAL_TABLET | ORAL | Status: DC | PRN

## 2020-11-10 NOTE — Progress Notes (Signed)
2hr postprandial CBG taken- 68. Pt given 120 mL apple juice before transport off unit.  Monitors removed- Pt taken to [place] via wheelchair.

## 2020-11-10 NOTE — H&P (Signed)
Obstetric H&P   Chief Complaint: Non-reactive office NST  Prenatal Care Provider: [facility]  History of Present Illness: 35 y.o. G2P1 [redacted]w[redacted]d by 11/28/2020, by Last Menstrual Period presenting to L&D after non-reactive office NST.  Pregnancy notable for late entry to care and GDM on metformin.  She also has a history of asthma.  On presentation breathing appeared labored.  The patient states she has been winded most of the third trimester as the baby has gotten bigger.  Has been using her home O2 inhaler.  No cough, no fevers, no chest pain.  She denies lower extremity swelling.  She did have COVID in January of 2020. She was also noted to be anemic on last check 10/02/2020.  +FM, no LOF, no VB, irregular contractions  Pregravid weight 85.7 kg Total Weight Gain 16.3 kg  pregnancy Problems (from 05/04/20 to present)    Problem Noted Resolved   Gestational diabetes mellitus (GDM) in third trimester 07/27/2020 by Homero Fellers, MD No   Abnormal glucose tolerance test (GTT) during pregnancy, antepartum 07/07/2020 by Will Bonnet, MD No   Overview Addendum 07/27/2020 12:16 PM by Homero Fellers, MD    - failed early 1h gtt (85) [x ] needs 3h gtt- elevated      Previous Version   Supervision of high risk pregnancy, antepartum 05/04/2020 by Will Bonnet, MD No   Overview Addendum 10/28/2020  4:27 PM by Homero Fellers, MD     Nursing Staff Provider  Office Location  [facility] Dating   LMP = 10 wk [place]  Language  English Anatomy US  Complete  Flu Vaccine  Declines Genetic Screen   None   TDaP vaccine   reports she had at work 05/2020 Hgb A1C or  GTT Early : elevated 3hr GTT at 20 weeks  Rhogam  Not needed   LAB RESULTS   Feeding Plan  Blood Type O/Positive/-- (11/09 1049)   Contraception  Antibody Negative (11/09 1049)  Circumcision  Rubella 2.41 (11/09 1049)  Pediatrician   RPR Non Reactive (11/09 1049)   Support Person  HBsAg Negative (11/09 1049)   Prenatal  Classes  HIV Non Reactive (11/09 1049)    Varicella Non-immune  BTL Consent  GBS  (For PCN allergy, check sensitivities)        VBAC Consent  Pap  2020 NIL     Hgb Electro    Covid Had in Jan 2022 CF      SMA         High Risk Pregnancy Diagnoses Gestational Diabetes- metformin Obesity in pregnancy History of Asthma  Antenatal testing: 1-2 times weekly  Growth [place]: every 4 weeks in third trimester      Previous Version   Obesity affecting pregnancy 05/04/2020 by Will Bonnet, MD No   Overview Addendum 07/22/2020 11:40 AM by Orlie Pollen, CNM    BMI >=35.0-39.9 [x]  early 1h gtt - 141 - 3h gtt on 07/22/20 [x]  u/s for dating  [ ]  nutritional goals [ ]  folic acid 1mg  [ ]  bASA (>12 weeks) [ ]  consider nutrition consult [ ]  consider maternal EKG 1st trimester [ ]  Growth u/s 28 [ ] , 32 [ ] , 36 weeks [ ]  [ ]  NST/AFI weekly 37+ weeks (37[] , 38[] , 39[] , 40[] ) [ ]  IOL by 41 weeks (scheduled, prn [] )       Previous Version       Review of Systems: 10 point review of systems negative unless otherwise noted in  HPI  Past Medical History: Patient Active Problem List   Diagnosis Date Noted  . NST (non-stress test) nonreactive 11/10/2020  . Shortness of breath due to pregnancy in third trimester 10/01/2020  . SOB (shortness of breath) 10/01/2020  . Asthma 10/01/2020  . COVID-19 virus infection 10/01/2020  . Microcytic anemia 10/01/2020  . Pregnancy 10/01/2020  . Abdominal pain in pregnancy, second trimester 08/14/2020  . Gestational diabetes mellitus (GDM) in third trimester 07/27/2020  . Asthma affecting pregnancy in second trimester 07/22/2020  . Abnormal glucose tolerance test (GTT) during pregnancy, antepartum 07/07/2020    - failed early 1h gtt (141) [x ] needs 3h gtt- elevated   . Supervision of high risk pregnancy, antepartum 05/04/2020     Nursing Staff Provider  Office Location  [facility] Dating   LMP = 10 wk [place]  Language  English Anatomy US  Complete  Flu  Vaccine  Declines Genetic Screen   None   TDaP vaccine   reports she had at work 05/2020 Hgb A1C or  GTT Early : elevated 3hr GTT at 20 weeks  Rhogam  Not needed   LAB RESULTS   Feeding Plan  Blood Type O/Positive/-- (11/09 1049)   Contraception  Antibody Negative (11/09 1049)  Circumcision  Rubella 2.41 (11/09 1049)  Pediatrician   RPR Non Reactive (11/09 1049)   Support Person  HBsAg Negative (11/09 1049)   Prenatal Classes  HIV Non Reactive (11/09 1049)    Varicella Non-immune  BTL Consent  GBS  (For PCN allergy, check sensitivities)        VBAC Consent  Pap  2020 NIL     Hgb Electro    Covid Had in Jan 2022 CF      SMA         High Risk Pregnancy Diagnoses Gestational Diabetes- metformin Obesity in pregnancy History of Asthma  Antenatal testing: 1-2 times weekly  Growth [place]: every 4 weeks in third trimester   . Obesity affecting pregnancy 05/04/2020    BMI >=35.0-39.9 [x]  early 1h gtt - 141 - 3h gtt on 07/22/20 [x]  u/s for dating  [ ]  nutritional goals [ ]  folic acid 1mg  [ ]  bASA (>12 weeks) [ ]  consider nutrition consult [ ]  consider maternal EKG 1st trimester [ ]  Growth u/s 28 [ ] , 32 [ ] , 36 weeks [ ]  [ ]  NST/AFI weekly 37+ weeks (37[] , 38[] , 39[] , 40[] ) [ ]  IOL by 41 weeks (scheduled, prn [] )      Past Surgical History: Past Surgical History:  Procedure Laterality Date  . NO PAST SURGERIES      Past Obstetric History: # 1 - Date: None, Sex: None, Weight: None, GA: None, Delivery: None, Apgar1: None, Apgar5: None, Living: None, Birth Comments: None  # 2 - Date: None, Sex: None, Weight: None, GA: None, Delivery: None, Apgar1: None, Apgar5: None, Living: None, Birth Comments: None   Past Gynecologic History:  Family History: Family History  Problem Relation Age of Onset  . Hypertension Mother   . Hypertension Sister   . Heart disease Maternal Grandfather     Social History: Social History   Socioeconomic History  . Marital status: Single     Spouse name: Not on file  . Number of children: Not on file  . Years of education: Not on file  . Highest education level: Not on file  Occupational History  . Not on file  Tobacco Use  . Smoking status: Former Research scientist (life sciences)  . Smokeless tobacco: Never  Used  Vaping Use  . Vaping Use: Never used  Substance and Sexual Activity  . Alcohol use: Not Currently  . Drug use: Never  . Sexual activity: Yes    Birth control/protection: None  Other Topics Concern  . Not on file  Social History Narrative  . Not on file   Social Determinants of Health   Financial Resource Strain: Not on file  Food Insecurity: Not on file  Transportation Needs: Not on file  Physical Activity: Not on file  Stress: Not on file  Social Connections: Not on file  Intimate Partner Violence: Not on file    Medications: Prior to Admission medications   Medication Sig Start Date End Date Taking? Authorizing Provider  albuterol (VENTOLIN HFA) 108 (90 Base) MCG/ACT inhaler Inhale 2 puffs into the lungs every 4 (four) hours as needed for wheezing or shortness of breath. 10/02/20  Yes Veal, Katelyn, CNM  Budesonide (PULMICORT FLEXHALER) 90 MCG/ACT inhaler Inhale 1 puff into the lungs 2 (two) times daily. 10/08/20  Yes Schuman, Christanna R, MD  dextromethorphan-guaiFENesin (MUCINEX DM) 30-600 MG 12hr tablet Take 1 tablet by mouth 2 (two) times daily as needed for cough. 10/02/20   Orlie Pollen, CNM  ferrous sulfate 325 (65 FE) MG tablet Take 1 tablet (325 mg total) by mouth once for 1 dose. 10/02/20 10/02/20  Orlie Pollen, CNM  fluticasone (FLOVENT HFA) 110 MCG/ACT inhaler Inhale 1 puff into the lungs in the morning and at bedtime. 10/28/20   Schuman, Stefanie Libel, MD  ipratropium (ATROVENT HFA) 17 MCG/ACT inhaler Inhale 2 puffs into the lungs every 4 (four) hours. 10/02/20   Orlie Pollen, CNM  metFORMIN (GLUCOPHAGE) 500 MG tablet Take 1 tablet (500 mg total) by mouth at bedtime. 10/02/20   Orlie Pollen, CNM   Prenat-Methylfol-Chol-Fish Oil (PRENATAL + COMPLETE MULTI) 0.267 & 373 MG THPK Take 1 tablet by mouth daily. 04/07/20   Johnson, Megan P, DO  vitamin B-12 1000 MCG tablet Take 1 tablet (1,000 mcg total) by mouth daily. 10/03/20   Orlie Pollen, CNM    Allergies: No Known Allergies  Physical Exam: Vitals: Last menstrual period 02/22/2020.  FHT: 140, moderate, +accels, no decels Toco: q44min  General: NAD HEENT: normocephalic, anicteric Pulmonary: Labored breathing, pauses inbetween sentences.  Lungs clear no wheezing or rhonchi Cardiovascular: RRR, distal pulses 2+ Abdomen: Gravid, non-tender Extremities: no edema, erythema, or tenderness Neurologic: Grossly intact Psychiatric: mood appropriate, affect full  Labs: No results found for this or any previous visit (from the past 24 hour(s)).  Assessment: 35 y.o. G2P1 [redacted]w[redacted]d by 11/28/2020, by Last Menstrual Period presenting for non-reactive NST in clinic today, also noted to have labored breathing on presentation  Plan: 1) Shortness of breath - history of asthma, on exam no wheezing appreciated although patient did just use her albuterol inhaler.  O2 sat upper 90's on RA with heart rate 90;s.  No tachycardia.  Physical exam no lower extremity edema, no physical exam findings concerning for DVT.  DDx reactive airway disease, polyhydramnios, dyspnea of pregnancy, hyperglycemia and Kussmaul respirations, cardiomyopathy, anemia, and PE.  Based on exam low concern for cardiomyopathy or PE. - CBC, CMP, BNP  - Beta-hydroxybutyric acid  And UA  2) Fetus - cat I tracing - last growth 10/28/2020 3332g or 7lbs 6oz c/w 86%ile, AC measuring three weeks ahead and >97.7%.  AFI at that time 13.7cm  3) PNL - Blood type --/--/O POS (02/24 8341) / Anti-bodyscreen NEG (02/24 9622) / Rubella 2.41 (11/09 1049) /  Varicella Immune / RPR Non Reactive (11/09 1049) / HBsAg Negative (11/09 1049) / HIV Non Reactive (11/09 1049) / GBS Negative/-- (03/30 1456)  4)  Immunization History -   There is no immunization history on file for this patient.  5) Disposition -   Malachy Mood, MD, [place], [facility] 11/10/2020, 3:23 PM

## 2020-11-10 NOTE — OB Triage Note (Signed)
Pt presented to L/D triage from office for NST and ultrasound. Pt reports general pregnancy aches and pains with intermittent "braxton hicks" ctxs rated 7/10. Pt reports positive fetal movement and no bleeding or LOF. Monitors applied and assessing. FHT 145. VSS. Pt alert and oriented. Pt reports ongoing feelings of SOB- reporting the need to take albuterol inhaler 3-4x daily. Lung sounds clear. 02 sat 97 on room air. Pt reports continued use of metformin with CBGs in 120s at highest with home checks. Will check 2 hr pp CBG at 4:00.

## 2020-11-10 NOTE — Progress Notes (Signed)
Prenatal Visit Note Date: 11/10/2020 Clinic: [facility]  Subjective:  Caitlin Ortega is a 35 y.o. G2P1 at [redacted]w[redacted]d being seen today for ongoing prenatal care.  She is currently monitored for the following issues for this high-risk pregnancy and has Supervision of high risk pregnancy, antepartum; Obesity affecting pregnancy; Abnormal glucose tolerance test (GTT) during pregnancy, antepartum; Asthma affecting pregnancy in second trimester; Gestational diabetes mellitus (GDM) in third trimester; Abdominal pain in pregnancy, second trimester; Shortness of breath due to pregnancy in third trimester; SOB (shortness of breath); Asthma; COVID-19 virus infection; Microcytic anemia; and Pregnancy on their problem list.  Patient reports [name], more labored breathing from asthma using the inhalers more and more, BS (fasting 80s, pp 120s always), edema worsening. .   Contractions: Irregular. Vag. Bleeding: None.  Movement: Present. Denies leaking of fluid.   The following portions of the patient's history were reviewed and updated as appropriate: allergies, current medications, past family history, past medical history, past social history, past surgical history and problem list. Problem list updated.  Objective:   Vitals:   11/10/20 1359  BP: 120/70  Weight: 225 lb (102.1 kg)    Fetal Status:     Movement: Present     General:  Alert, oriented and cooperative. Patient is in no acute distress.  Skin: Skin is warm and dry. No rash noted.   Cardiovascular: Normal heart rate noted  Respiratory: Normal respiratory effort, no problems with respiration noted  Abdomen: Soft, gravid, appropriate for gestational age. Pain/Pressure: Present     Pelvic:  Cervical exam deferred        Extremities: Normal range of motion.     Mental Status: Normal mood and affect. Normal behavior. Normal judgment and thought content.   A NST procedure was performed with FHR monitoring and a normal baseline established,  appropriate time of 20-40 minutes of evaluation, and no accels >2 seen w 15x15 characteristics.  Results show a NON-REACTIVE NST.   Assessment and Plan:  Pregnancy: G2P1 at [redacted]w[redacted]d  1. Gestational diabetes mellitus (GDM) in third trimester controlled on oral hypoglycemic drug Last growth scan LGA (10/28/20) Poor compliance Question of accurate glucose log recordings and results Concern for macrosomia, stillbirth, diabetic complications w the fetus  Plan NST at [place] for AFI and growth at [facility] now, based on these concerns as well as NR NST currently  pregnancy Problems (from 05/04/20 to present)    Problem Noted Resolved   Gestational diabetes mellitus (GDM) in third trimester       Nursing Staff Provider  Office Location  [facility] Dating   LMP = 10 wk [place]  Language  English Anatomy US  Complete  Flu Vaccine  Declines Genetic Screen   None   TDaP vaccine   reports she had at work 05/2020 Hgb A1C or  GTT Early : elevated 3hr GTT at 20 weeks  Rhogam  Not needed   LAB RESULTS   Feeding Plan  Blood Type O/Positive/-- (11/09 1049)   Contraception  Antibody Negative (11/09 1049)  Circumcision  Rubella 2.41 (11/09 1049)  Pediatrician   RPR Non Reactive (11/09 1049)   Support Person  HBsAg Negative (11/09 1049)   Prenatal Classes  HIV Non Reactive (11/09 1049)    Varicella Non-immune  BTL Consent  GBS  (For PCN allergy, check sensitivities)        VBAC Consent  Pap  2020 NIL     Hgb Electro    Covid Had in Jan 2022 CF  SMA         High Risk Pregnancy Diagnoses Gestational Diabetes- metformin Obesity in pregnancy History of Asthma  Antenatal testing: 1-2 times weekly  Growth [place]: every 4 weeks in third trimester      Previous Version   Obesity affecting pregnancy 05/04/2020 by Will Bonnet, MD No   Overview Addendum 07/22/2020 11:40 AM by Orlie Pollen, CNM    BMI >=35.0-39.9 [x]  early 1h gtt - 141 - 3h gtt on 07/22/20 [x]  u/s for dating  [x ] bASA (>12 weeks) [ ]   Growth u/s 28 [ ] , 32 [ ] , 36 weeks [ ]  [ ]  NST/AFI weekly 37+ weeks (37[] , 38[] , 39[] , 40[] ) [ ]  IOL by 41 weeks (scheduled, prn [] )      Return in about 1 week (around 11/17/2020) for HROB and NST .  (if not induced prior to this time) Pros and cons of IOL discussed w pt.  Barnett Applebaum, MD, [facility], [facility] 11/10/2020  2:27 PM

## 2020-11-10 NOTE — Discharge Summary (Signed)
Physician Final Progress Note  Patient ID: Caitlin Ortega MRN: [id number] DOB/AGE: November 05, 1985 35 y.o.  Admit date: 11/10/2020 Admitting provider: Malachy Mood, MD Discharge date: 11/10/2020   Admission Diagnoses: .NST non-reactive  Discharge Diagnoses:  Active Problems:   NST (non-stress test) nonreactive  Dyspnea of pregnancy  35 y.o. G2P1 at [redacted]w[redacted]d presenting from office for non-reactive NST obtained secondary to history of GDM on metformin this pregnancy.   On presentation NST was reactive.  The patient has been non-compliant with logs, while reported BG have been in the normal range growth scan at 36 weeks showed 3 week AC acceleration.  Given fetopathy as well as GDM on oral medications will delivery at around 38 weeks.  IOL set for 8AM on 11/13/20. +FM, no LOF, no VB, no ctx.  The patient has normal AFI on ultrasound.  Her NST was reactive.  BG was normal, no evidence of ketones on UA, normal anion gap, and negative beta-hydroxybutyric acid.  Patient with with anemia noted on 28 week labs improved Hgb to 10.3 on labs today.    Blood pressure (!) 98/53, pulse 81, temperature 98.4 F (36.9 C), resp. rate (!) 22, height 5\' 5"  (1.651 m), weight 102.1 kg, last menstrual period 02/22/2020, SpO2 95 %.   Consults: None  Significant Findings/ Diagnostic Studies:  Results for orders placed or performed during the hospital encounter of 11/10/20 (from the past 24 hour(s))  Urinalysis, Complete w Microscopic Urine, Clean Catch     Status: Abnormal   Collection Time: 11/10/20  3:22 PM  Result Value Ref Range   Color, Urine YELLOW (A) YELLOW   APPearance HAZY (A) CLEAR   Specific Gravity, Urine 1.013 1.005 - 1.030   pH 6.0 5.0 - 8.0   Glucose, UA NEGATIVE NEGATIVE mg/dL   Hgb urine dipstick NEGATIVE NEGATIVE   Bilirubin Urine NEGATIVE NEGATIVE   Ketones, ur NEGATIVE NEGATIVE mg/dL   Protein, ur NEGATIVE NEGATIVE mg/dL   Nitrite NEGATIVE NEGATIVE   Leukocytes,Ua TRACE (A) NEGATIVE    RBC / HPF 0-5 0 - 5 RBC/hpf   WBC, UA 0-5 0 - 5 WBC/hpf   Bacteria, UA NONE SEEN NONE SEEN   Squamous Epithelial / LPF 11-20 0 - 5   Mucus PRESENT    Amorphous Crystal PRESENT   Basic metabolic panel     Status: Abnormal   Collection Time: 11/10/20  3:39 PM  Result Value Ref Range   Sodium 135 135 - 145 mmol/L   Potassium 4.0 3.5 - 5.1 mmol/L   Chloride 106 98 - 111 mmol/L   CO2 20 (L) 22 - 32 mmol/L   Glucose, Bld 68 (L) 70 - 99 mg/dL   BUN 7 6 - 20 mg/dL   Creatinine, Ser 0.49 0.44 - 1.00 mg/dL   Calcium 9.3 8.9 - 10.3 mg/dL   GFR, Estimated >60 >60 mL/min   Anion gap 9 5 - 15  Beta-hydroxybutyric acid     Status: None   Collection Time: 11/10/20  3:39 PM  Result Value Ref Range   Beta-Hydroxybutyric Acid 0.16 0.05 - 0.27 mmol/L  CBC     Status: Abnormal   Collection Time: 11/10/20  3:39 PM  Result Value Ref Range   WBC 9.0 4.0 - 10.5 K/uL   RBC 4.24 3.87 - 5.11 MIL/uL   Hemoglobin 10.3 (L) 12.0 - 15.0 g/dL   HCT 32.5 (L) 36.0 - 46.0 %   MCV 76.7 (L) 80.0 - 100.0 fL   MCH 24.3 (L) 26.0 -  34.0 pg   MCHC 31.7 30.0 - 36.0 g/dL   RDW 18.7 (H) 11.5 - 15.5 %   Platelets 254 150 - 400 K/uL   nRBC 0.0 0.0 - 0.2 %  Brain natriuretic peptide     Status: None   Collection Time: 11/10/20  3:39 PM  Result Value Ref Range   B Natriuretic Peptide 16.3 0.0 - 100.0 pg/mL   US OB Limited  Result Date: 11/10/2020 CLINICAL DATA:  Gestational diabetes affecting pregnancy EXAM: LIMITED OBSTETRIC ULTRASOUND COMPARISON:  Ultrasound October 28, 2020 FINDINGS: Number of Fetuses: 1 Heart Rate:  152 bpm Movement: Yes Presentation: Cephalic Placental Location: Anterior Previa: No Amniotic Fluid (Subjective):  Within normal limits. AFI: 11.7 cm BPD: 9.9 cm 40 w  3 d IMPRESSION: Single viable fetus measuring 40 weeks and 3 days by BPD, further described above. This exam is performed on an emergent basis and does not comprehensively evaluate fetal size, dating, or anatomy; follow-up complete OB US should  be considered if further fetal assessment is warranted. Electronically Signed   By: Dahlia Bailiff MD   On: 11/10/2020 16:49   US OB Follow Up  Result Date: 10/29/2020 Patient Name: Caitlin Ortega DOB: 12-Aug-1985 MRN: [id number] ULTRASOUND REPORT Location: [facility] Date of Service: 10/28/2020 Indications:growth/afi Findings: Nelda [place] intrauterine pregnancy is visualized with FHR at 141 BPM. Biometrics give an (U/S) Gestational age of [redacted]w[redacted]d and an (U/S) EDD of 11/16/20; this is 12 days ahead of the clinically established Estimated Date of Delivery: 11/28/20. Fetal presentation is Cephalic. Placenta: fundal. Grade: 2 AFI: 13.7 cm Growth percentile is 86.0.  AC percentile is >97.7 (measuring 3 weeks ahead). EFW: 3,332g (7lbs 6oz) Impression: 1. [redacted]w[redacted]d Viable Singleton Intrauterine pregnancy previously established criteria. 2. Growth is 86.0 %ile with AC measuring 3 weeks ahead.  AFI is 13.7 cm. Recommendations: 1.Clinical correlation with the patient's History and Physical Exam. Vita Barley, RT I have reviewed this ultrasound and the report. I agree with the above assessment and plan. [facility] 10/29/20 10:33 AM      Procedures:  Baseline: 140 Variability: moderate Accelerations: present Decelerations: absent Tocometry: irritability The patient was monitored for 30 minutes, fetal heart rate tracing was deemed reactive, category I tracing,  CPT [phone]   Discharge Condition: good  Disposition: Discharge disposition: 01-Home or Self Care       Diet: carb modified diebetic diet  Discharge Activity: Activity as tolerated  Discharge Instructions    Discharge activity:  No Restrictions   Complete by: As directed    Discharge diet:  No restrictions   Complete by: As directed    Fetal Kick Count:  Lie on our left side for one hour after a meal, and count the number of times your baby kicks.  If it is less than 5 times, get up, move  around and drink some juice.  Repeat the test 30 minutes later.  If it is still less than 5 kicks in an hour, notify your doctor.   Complete by: As directed    LABOR:  When conractions begin, you should start to time them from the beginning of one contraction to the beginning  of the next.  When contractions are 5 - 10 minutes apart or less and have been regular for at least an hour, you should call your health care provider.   Complete by: As directed    No sexual activity restrictions   Complete by: As directed  Notify physician for bleeding from the vagina   Complete by: As directed    Notify physician for blurring of vision or spots before the eyes   Complete by: As directed    Notify physician for chills or fever   Complete by: As directed    Notify physician for fainting spells, "black outs" or loss of consciousness   Complete by: As directed    Notify physician for increase in vaginal discharge   Complete by: As directed    Notify physician for leaking of fluid   Complete by: As directed    Notify physician for pain or burning when urinating   Complete by: As directed    Notify physician for pelvic pressure (sudden increase)   Complete by: As directed    Notify physician for severe or continued nausea or vomiting   Complete by: As directed    Notify physician for sudden gushing of fluid from the vagina (with or without continued leaking)   Complete by: As directed    Notify physician for sudden, constant, or occasional abdominal pain   Complete by: As directed    Notify physician if baby moving less than usual   Complete by: As directed      Allergies as of 11/10/2020   No Known Allergies     Medication List    TAKE these medications   albuterol 108 (90 Base) MCG/ACT inhaler Commonly known as: VENTOLIN HFA Inhale 2 puffs into the lungs every 4 (four) hours as needed for wheezing or shortness of breath.   cyanocobalamin 1000 MCG tablet Take 1 tablet (1,000 mcg total)  by mouth daily.   dextromethorphan-guaiFENesin 30-600 MG 12hr tablet Commonly known as: MUCINEX DM Take 1 tablet by mouth 2 (two) times daily as needed for cough.   ferrous sulfate 325 (65 FE) MG tablet Take 1 tablet (325 mg total) by mouth once for 1 dose.   fluticasone 110 MCG/ACT inhaler Commonly known as: FLOVENT HFA Inhale 1 puff into the lungs in the morning and at bedtime.   ipratropium 17 MCG/ACT inhaler Commonly known as: ATROVENT HFA Inhale 2 puffs into the lungs every 4 (four) hours.   metFORMIN 500 MG tablet Commonly known as: GLUCOPHAGE Take 1 tablet (500 mg total) by mouth at bedtime.   Prenatal + Complete Multi 0.267 & 373 MG Thpk Take 1 tablet by mouth daily.   Pulmicort Flexhaler 90 MCG/ACT inhaler Generic drug: Budesonide Inhale 1 puff into the lungs 2 (two) times daily.        Total time spent taking care of this patient: 40 minutes  Signed: Malachy Mood 11/10/2020, 5:28 PM

## 2020-11-10 NOTE — OB Triage Note (Signed)
Pt discharged home in stable condition per MD. Pt to follow-up with OB office. Pt reports no change in contraction intensity-palpating mild, pt unaware. Discharge instructions and medications reviewed- pt verbalized understanding and reports no questions or concerns at this time. Pt scheduled for induction.

## 2020-11-11 ENCOUNTER — Telehealth: Payer: Self-pay | Admitting: *Deleted

## 2020-11-11 LAB — GLUCOSE, CAPILLARY: Glucose-Capillary: 68 mg/dL — ABNORMAL LOW (ref 70–99)

## 2020-11-11 NOTE — Telephone Encounter (Signed)
Transition Care Management Unsuccessful Follow-up Telephone Call  Date of discharge and from where:  4/5/202 [facility] ED  Attempts:  1st Attempt  Reason for unsuccessful TCM follow-up call:  Unable to reach patient

## 2020-11-12 DIAGNOSIS — F331 Major depressive disorder, recurrent, moderate: Secondary | ICD-10-CM | POA: Diagnosis not present

## 2020-11-12 DIAGNOSIS — F411 Generalized anxiety disorder: Secondary | ICD-10-CM | POA: Diagnosis not present

## 2020-11-12 NOTE — Telephone Encounter (Signed)
Transition Care Management Follow-up Telephone Call  Date of discharge and from where: 11/10/2020 [facility] ED  How have you been since you were released from the hospital? "I am okay"  Any questions or concerns? No  Items Reviewed:  Did the pt receive and understand the discharge instructions provided? Yes   Medications obtained and verified? Yes   Other? No   Any new allergies since your discharge? No   Dietary orders reviewed? No  Do you have support at home? Yes    Functional Questionnaire: (I = Independent and D = Dependent) ADLs: I  Bathing/Dressing- I  Meal Prep- I  Eating- I  Maintaining continence- I  Transferring/Ambulation- I  Managing Meds- I  Follow up appointments reviewed:   PCP Hospital f/u appt confirmed? No    Specialist Hospital f/u appt confirmed? No    Are transportation arrangements needed? No   If their condition worsens, is the pt aware to call PCP or go to the Emergency Dept.? Yes  Was the patient provided with contact information for the PCP's office or ED? Yes  Was to pt encouraged to call back with questions or concerns? Yes

## 2020-11-13 ENCOUNTER — Inpatient Hospital Stay: Payer: Medicaid Other | Admitting: Anesthesiology

## 2020-11-13 ENCOUNTER — Other Ambulatory Visit: Payer: Self-pay

## 2020-11-13 ENCOUNTER — Inpatient Hospital Stay
Admission: RE | Admit: 2020-11-13 | Discharge: 2020-11-15 | DRG: 807 | Disposition: A | Discharge status: Home / Self Care | Payer: Medicaid Other | Attending: Obstetrics and Gynecology | Admitting: Obstetrics and Gynecology

## 2020-11-13 ENCOUNTER — Encounter: Payer: Self-pay | Admitting: Obstetrics and Gynecology

## 2020-11-13 DIAGNOSIS — Z3A37 37 weeks gestation of pregnancy: Secondary | ICD-10-CM

## 2020-11-13 DIAGNOSIS — J45909 Unspecified asthma, uncomplicated: Secondary | ICD-10-CM | POA: Diagnosis not present

## 2020-11-13 DIAGNOSIS — O0990 Supervision of high risk pregnancy, unspecified, unspecified trimester: Secondary | ICD-10-CM

## 2020-11-13 DIAGNOSIS — Z20822 Contact with and (suspected) exposure to covid-19: Secondary | ICD-10-CM | POA: Diagnosis present

## 2020-11-13 DIAGNOSIS — O9902 Anemia complicating childbirth: Secondary | ICD-10-CM | POA: Diagnosis not present

## 2020-11-13 DIAGNOSIS — Z8616 Personal history of COVID-19: Secondary | ICD-10-CM | POA: Diagnosis not present

## 2020-11-13 DIAGNOSIS — O99213 Obesity complicating pregnancy, third trimester: Secondary | ICD-10-CM

## 2020-11-13 DIAGNOSIS — Z87891 Personal history of nicotine dependence: Secondary | ICD-10-CM | POA: Diagnosis not present

## 2020-11-13 DIAGNOSIS — O24425 Gestational diabetes mellitus in childbirth, controlled by oral hypoglycemic drugs: Principal | ICD-10-CM | POA: Diagnosis present

## 2020-11-13 DIAGNOSIS — O9952 Diseases of the respiratory system complicating childbirth: Secondary | ICD-10-CM | POA: Diagnosis not present

## 2020-11-13 DIAGNOSIS — O99214 Obesity complicating childbirth: Secondary | ICD-10-CM | POA: Diagnosis not present

## 2020-11-13 DIAGNOSIS — Z3A38 38 weeks gestation of pregnancy: Secondary | ICD-10-CM

## 2020-11-13 DIAGNOSIS — O99810 Abnormal glucose complicating pregnancy: Secondary | ICD-10-CM

## 2020-11-13 DIAGNOSIS — O24415 Gestational diabetes mellitus in pregnancy, controlled by oral hypoglycemic drugs: Secondary | ICD-10-CM

## 2020-11-13 DIAGNOSIS — O24419 Gestational diabetes mellitus in pregnancy, unspecified control: Secondary | ICD-10-CM | POA: Diagnosis present

## 2020-11-13 DIAGNOSIS — Z3490 Encounter for supervision of normal pregnancy, unspecified, unspecified trimester: Secondary | ICD-10-CM

## 2020-11-13 DIAGNOSIS — O99210 Obesity complicating pregnancy, unspecified trimester: Secondary | ICD-10-CM | POA: Diagnosis present

## 2020-11-13 HISTORY — DX: Anemia, unspecified: D64.9

## 2020-11-13 LAB — PROTEIN / CREATININE RATIO, URINE
Creatinine, Urine: 49 mg/dL
Protein Creatinine Ratio: 0.18 mg/mg{Cre} — ABNORMAL HIGH (ref 0.00–0.15)
Total Protein, Urine: 9 mg/dL

## 2020-11-13 LAB — COMPREHENSIVE METABOLIC PANEL
ALT: 12 U/L (ref 0–44)
AST: 23 U/L (ref 15–41)
Albumin: 2.8 g/dL — ABNORMAL LOW (ref 3.5–5.0)
Alkaline Phosphatase: 159 U/L — ABNORMAL HIGH (ref 38–126)
Anion gap: 9 (ref 5–15)
BUN: 5 mg/dL — ABNORMAL LOW (ref 6–20)
CO2: 19 mmol/L — ABNORMAL LOW (ref 22–32)
Calcium: 8.7 mg/dL — ABNORMAL LOW (ref 8.9–10.3)
Chloride: 106 mmol/L (ref 98–111)
Creatinine, Ser: 0.44 mg/dL (ref 0.44–1.00)
GFR, Estimated: >60 mL/min (ref >60)
Glucose, Bld: 79 mg/dL (ref 70–99)
Potassium: 3.7 mmol/L (ref 3.5–5.1)
Sodium: 134 mmol/L — ABNORMAL LOW (ref 135–145)
Total Bilirubin: 0.4 mg/dL (ref 0.3–1.2)
Total Protein: 6.4 g/dL — ABNORMAL LOW (ref 6.5–8.1)

## 2020-11-13 LAB — GLUCOSE, CAPILLARY
Glucose-Capillary: 78 mg/dL (ref 70–99)
Glucose-Capillary: 85 mg/dL (ref 70–99)
Glucose-Capillary: 88 mg/dL (ref 70–99)

## 2020-11-13 LAB — CBC
HCT: 31.3 % — ABNORMAL LOW (ref 36.0–46.0)
HCT: 30.5 % — ABNORMAL LOW (ref 36.0–46.0)
Hemoglobin: 9.9 g/dL — ABNORMAL LOW (ref 12.0–15.0)
Hemoglobin: 9.7 g/dL — ABNORMAL LOW (ref 12.0–15.0)
MCH: 24.1 pg — ABNORMAL LOW (ref 26.0–34.0)
MCH: 24.3 pg — ABNORMAL LOW (ref 26.0–34.0)
MCHC: 31.6 g/dL (ref 30.0–36.0)
MCHC: 31.8 g/dL (ref 30.0–36.0)
MCV: 76.2 fL — ABNORMAL LOW (ref 80.0–100.0)
MCV: 76.3 fL — ABNORMAL LOW (ref 80.0–100.0)
Platelets: 225 10*3/uL (ref 150–400)
Platelets: 227 10*3/uL (ref 150–400)
RBC: 4.11 MIL/uL (ref 3.87–5.11)
RBC: 4 MIL/uL (ref 3.87–5.11)
RDW: 18.8 % — ABNORMAL HIGH (ref 11.5–15.5)
RDW: 18.7 % — ABNORMAL HIGH (ref 11.5–15.5)
WBC: 8.5 10*3/uL (ref 4.0–10.5)
WBC: 10.7 10*3/uL — ABNORMAL HIGH (ref 4.0–10.5)
nRBC: 0 % (ref 0.0–0.2)
nRBC: 0 % (ref 0.0–0.2)

## 2020-11-13 LAB — TYPE AND SCREEN
ABO/RH(D): O POS
Antibody Screen: NEGATIVE

## 2020-11-13 LAB — GLUCOSE, RANDOM: Glucose, Bld: 126 mg/dL — ABNORMAL HIGH (ref 70–99)

## 2020-11-13 MED ORDER — MISOPROSTOL 25 MCG QUARTER TABLET
25.0000 ug | ORAL_TABLET | Freq: Once | ORAL | Status: AC
  Administered 2020-11-13: 25 ug via BUCCAL

## 2020-11-13 MED ORDER — ALBUTEROL SULFATE HFA 108 (90 BASE) MCG/ACT IN AERS
1.0000 | INHALATION_SPRAY | RESPIRATORY_TRACT | Status: DC | PRN
  Administered 2020-11-13: 2 via RESPIRATORY_TRACT
  Filled 2020-11-13: qty 6.7

## 2020-11-13 MED ORDER — OXYTOCIN-SODIUM CHLORIDE 30-0.9 UT/500ML-% IV SOLN
1.0000 m[IU]/min | INTRAVENOUS | Status: DC
  Administered 2020-11-14: 2 m[IU]/min via INTRAVENOUS

## 2020-11-13 MED ORDER — PHENYLEPHRINE 40 MCG/ML (10ML) SYRINGE FOR IV PUSH (FOR BLOOD PRESSURE SUPPORT): 80.0000 ug | PREFILLED_SYRINGE | INTRAVENOUS | Status: DC | PRN

## 2020-11-13 MED ORDER — MISOPROSTOL 200 MCG PO TABS
ORAL_TABLET | ORAL | Status: AC
  Administered 2020-11-13: 25 ug via VAGINAL
  Filled 2020-11-13: qty 4

## 2020-11-13 MED ORDER — OXYTOCIN-SODIUM CHLORIDE 30-0.9 UT/500ML-% IV SOLN
INTRAVENOUS | Status: AC
  Administered 2020-11-14: 333 mL via INTRAVENOUS
  Filled 2020-11-13: qty 1000

## 2020-11-13 MED ORDER — EPHEDRINE 5 MG/ML INJ: 10.0000 mg | INTRAVENOUS | Status: DC | PRN

## 2020-11-13 MED ORDER — LIDOCAINE-EPINEPHRINE (PF) 1.5 %-1:200000 IJ SOLN
INTRAMUSCULAR | Status: DC | PRN
  Administered 2020-11-13: 3 mL via EPIDURAL

## 2020-11-13 MED ORDER — LACTATED RINGERS IV SOLN
500.0000 mL | Freq: Once | INTRAVENOUS | Status: AC
  Administered 2020-11-13: 500 mL via INTRAVENOUS

## 2020-11-13 MED ORDER — FENTANYL 2.5 MCG/ML W/ROPIVACAINE 0.15% IN NS 100 ML EPIDURAL (ARMC)
EPIDURAL | Status: AC
  Filled 2020-11-13: qty 100

## 2020-11-13 MED ORDER — LIDOCAINE HCL (PF) 1 % IJ SOLN: 30.0000 mL | INTRAMUSCULAR | Status: DC | PRN

## 2020-11-13 MED ORDER — LIDOCAINE HCL (PF) 1 % IJ SOLN
INTRAMUSCULAR | Status: AC
  Filled 2020-11-13: qty 30

## 2020-11-13 MED ORDER — FENTANYL 2.5 MCG/ML W/ROPIVACAINE 0.15% IN NS 100 ML EPIDURAL (ARMC)
EPIDURAL | Status: DC | PRN
  Administered 2020-11-13: 12 mL/h via EPIDURAL

## 2020-11-13 MED ORDER — SOD CITRATE-CITRIC ACID 500-334 MG/5ML PO SOLN: 30.0000 mL | ORAL | Status: DC | PRN

## 2020-11-13 MED ORDER — LIDOCAINE HCL (PF) 1 % IJ SOLN
INTRAMUSCULAR | Status: DC | PRN
  Administered 2020-11-13: 1.5 mL

## 2020-11-13 MED ORDER — ACETAMINOPHEN 325 MG PO TABS: 650.0000 mg | ORAL_TABLET | ORAL | Status: DC | PRN

## 2020-11-13 MED ORDER — LACTATED RINGERS IV SOLN: 500.0000 mL | INTRAVENOUS | Status: DC | PRN

## 2020-11-13 MED ORDER — OXYTOCIN 10 UNIT/ML IJ SOLN
INTRAMUSCULAR | Status: AC
  Filled 2020-11-13: qty 2

## 2020-11-13 MED ORDER — FENTANYL 2.5 MCG/ML W/ROPIVACAINE 0.15% IN NS 100 ML EPIDURAL (ARMC)
12.0000 mL/h | EPIDURAL | Status: DC
  Administered 2020-11-14: 12 mL/h via EPIDURAL
  Filled 2020-11-13 (×2): qty 100

## 2020-11-13 MED ORDER — OXYTOCIN-SODIUM CHLORIDE 30-0.9 UT/500ML-% IV SOLN
2.5000 [IU]/h | INTRAVENOUS | Status: DC
  Administered 2020-11-14: 2.5 [IU]/h via INTRAVENOUS

## 2020-11-13 MED ORDER — TERBUTALINE SULFATE 1 MG/ML IJ SOLN: 0.2500 mg | Freq: Once | INTRAMUSCULAR | Status: DC | PRN

## 2020-11-13 MED ORDER — OXYTOCIN BOLUS FROM INFUSION: 333.0000 mL | Freq: Once | INTRAVENOUS | Status: AC

## 2020-11-13 MED ORDER — MISOPROSTOL 25 MCG QUARTER TABLET
25.0000 ug | ORAL_TABLET | ORAL | Status: DC | PRN
  Administered 2020-11-13: 25 ug via VAGINAL
  Filled 2020-11-13 (×2): qty 1

## 2020-11-13 MED ORDER — DIPHENHYDRAMINE HCL 50 MG/ML IJ SOLN: 12.5000 mg | INTRAMUSCULAR | Status: DC | PRN

## 2020-11-13 MED ORDER — LACTATED RINGERS IV SOLN: INTRAVENOUS | Status: DC

## 2020-11-13 MED ORDER — ONDANSETRON HCL 4 MG/2ML IJ SOLN: 4.0000 mg | Freq: Four times a day (QID) | INTRAMUSCULAR | Status: DC | PRN

## 2020-11-13 MED ORDER — SODIUM CHLORIDE (PF) 0.9 % IJ SOLN
INTRAMUSCULAR | Status: AC
  Filled 2020-11-13: qty 50

## 2020-11-13 MED ORDER — AMMONIA AROMATIC IN INHA
RESPIRATORY_TRACT | Status: AC
  Filled 2020-11-13: qty 10

## 2020-11-13 NOTE — Progress Notes (Signed)
   Subjective:  Feeling contractions  Objective:   Vitals: Blood pressure (!) 150/68, pulse 91, temperature 99 F (37.2 C), temperature source Oral, resp. rate 16, height 5\' 5"  (1.651 m), weight 101.6 kg, last menstrual period 02/22/2020. General: NAD Abdomen: gravid, non-tender Cervical Exam:  Dilation: 3 Effacement (%): 70 Cervical Position: Posterior Station: -2 Presentation: Vertex Exam by:: Aakash Hollomon MD  FHT: 150, moderate, +accels, no decels Toco: q2-25min  Results for orders placed or performed during the hospital encounter of 11/13/20 (from the past 24 hour(s))  CBC     Status: Abnormal   Collection Time: 11/13/20  9:22 AM  Result Value Ref Range   WBC 8.5 4.0 - 10.5 K/uL   RBC 4.11 3.87 - 5.11 MIL/uL   Hemoglobin 9.9 (L) 12.0 - 15.0 g/dL   HCT 31.3 (L) 36.0 - 46.0 %   MCV 76.2 (L) 80.0 - 100.0 fL   MCH 24.1 (L) 26.0 - 34.0 pg   MCHC 31.6 30.0 - 36.0 g/dL   RDW 18.8 (H) 11.5 - 15.5 %   Platelets 225 150 - 400 K/uL   nRBC 0.0 0.0 - 0.2 %  Type and screen [place]     Status: None   Collection Time: 11/13/20  9:22 AM  Result Value Ref Range   ABO/RH(D) O POS    Antibody Screen NEG    Sample Expiration      11/16/2020,2359 Performed at [facility], [address] [place], [place] [address]   Glucose, serum     Status: Abnormal   Collection Time: 11/13/20  9:22 AM  Result Value Ref Range   Glucose, Bld 126 (H) 70 - 99 mg/dL  Glucose, capillary     Status: None   Collection Time: 11/13/20 11:53 AM  Result Value Ref Range   Glucose-Capillary 78 70 - 99 mg/dL  Glucose, capillary     Status: None   Collection Time: 11/13/20  3:09 PM  Result Value Ref Range   Glucose-Capillary 85 70 - 99 mg/dL    Assessment:   35 y.o. G2P1001 [redacted]w[redacted]d IOL GDM non-compliant with fetopathy 3 week AC acceleration on 36 week growth  Plan:   1) Labor - monitor contractions patter, start pitocin if spaces out.  Will recheck in 2-hr and plan on  AROM   2) Fetus - cat I tracing  3) GDM - BG have been well controlled   4) Elevated BP will check preeclampsia labs  Malachy Mood, MD, [place], [facility] 11/13/2020, 7:56 PM

## 2020-11-13 NOTE — Anesthesia Procedure Notes (Signed)
Epidural Patient location during procedure: OB Start time: 11/13/2020 9:12 PM End time: 11/13/2020 9:34 PM  Staffing Performed: anesthesiologist   Preanesthetic Checklist Completed: patient identified, IV checked, site marked, risks and benefits discussed, surgical consent, monitors and equipment checked, pre-op evaluation and timeout performed  Epidural Patient position: sitting Prep: Betadine Patient monitoring: heart rate, continuous pulse ox and blood pressure Approach: midline Location: L4-L5 Injection technique: LOR saline  Needle:  Needle type: Tuohy  Needle gauge: 17 G Needle length: 9 cm and 9 Needle insertion depth: 8 cm Catheter type: closed end flexible Catheter size: 20 Guage Catheter at skin depth: 14 cm Test dose: negative and 1.5% lidocaine with Epi 1:200 K  Assessment Events: blood not aspirated, injection not painful, no injection resistance, no paresthesia and negative IV test  Additional Notes   Patient tolerated the insertion well without complications.Reason for block:procedure for pain

## 2020-11-13 NOTE — Progress Notes (Signed)
   Subjective:  Comfortable, epidural in place now  Objective:   Vitals: Blood pressure 101/77, pulse 82, temperature 99 F (37.2 C), temperature source Oral, resp. rate 16, height 5\' 5"  (1.651 m), weight 101.6 kg, last menstrual period 02/22/2020, SpO2 96 %. General: NAd Abdomen: gravid, non-tender Cervical Exam:  Dilation: 3 Effacement (%): 70 Cervical Position: Posterior Station: -2 Presentation: Vertex Exam by:: Staelber MD  FHT: 150, moderate, +accels, one late while on back after epidural Toco: q2-34min  Results for orders placed or performed during the hospital encounter of 11/13/20 (from the past 24 hour(s))  CBC     Status: Abnormal   Collection Time: 11/13/20  9:22 AM  Result Value Ref Range   WBC 8.5 4.0 - 10.5 K/uL   RBC 4.11 3.87 - 5.11 MIL/uL   Hemoglobin 9.9 (L) 12.0 - 15.0 g/dL   HCT 31.3 (L) 36.0 - 46.0 %   MCV 76.2 (L) 80.0 - 100.0 fL   MCH 24.1 (L) 26.0 - 34.0 pg   MCHC 31.6 30.0 - 36.0 g/dL   RDW 18.8 (H) 11.5 - 15.5 %   Platelets 225 150 - 400 K/uL   nRBC 0.0 0.0 - 0.2 %  Type and screen [place]     Status: None   Collection Time: 11/13/20  9:22 AM  Result Value Ref Range   ABO/RH(D) O POS    Antibody Screen NEG    Sample Expiration      11/16/2020,2359 Performed at [facility], [address] [place], [place] [address]   Glucose, serum     Status: Abnormal   Collection Time: 11/13/20  9:22 AM  Result Value Ref Range   Glucose, Bld 126 (H) 70 - 99 mg/dL  Glucose, capillary     Status: None   Collection Time: 11/13/20 11:53 AM  Result Value Ref Range   Glucose-Capillary 78 70 - 99 mg/dL  Glucose, capillary     Status: None   Collection Time: 11/13/20  3:09 PM  Result Value Ref Range   Glucose-Capillary 85 70 - 99 mg/dL  Glucose, capillary     Status: None   Collection Time: 11/13/20  8:33 PM  Result Value Ref Range   Glucose-Capillary 88 70 - 99 mg/dL    Assessment:   36 y.o. G2P1001 [redacted]w[redacted]d IOL GDM  with fetopathy  Plan:   1) Labor - AROM clear, pitocin as needed for augmentation  2) Fetus - cat I tracing  3) GDM - BG look good thus far  4) Elevated BP - one time elevated BP preeclampsia labs pending  Malachy Mood, MD, [place], [facility] 11/13/2020, 9:45 PM

## 2020-11-13 NOTE — Progress Notes (Signed)
Labor Progress Note  Caitlin Ortega is a 35 y.o. G2P1001 at [redacted]w[redacted]d by LMP c/w 10 week [place], admitted for induction of labor due to Gestational diabetes.  Subjective: Patient is resting in bed. She states she was previously feeling cramping, but that has now eased up. She has her albuterol inhaler at the bedside and has used it once since admission.  Objective: BP (!) 117/53   Pulse 84   Temp 98.2 F (36.8 C) (Oral)   Resp 18   Ht 5\' 5"  (1.651 m)   Wt 101.6 kg   LMP 02/22/2020   BMI 37.28 kg/m  Notable VS details: wnl  Fetal Assessment: FHT:  FHR: 140 bpm, variability: moderate,  accelerations:  Present,  decelerations:  Absent Category/reactivity:  Category I UC:   irregular, every 2-7 minutes - palpate mild SVE:  0.5/50/-3 Membrane status: Intact Amniotic color: n/a  Labs: Lab Results  Component Value Date   WBC 8.5 11/13/2020   HGB 9.9 (L) 11/13/2020   HCT 31.3 (L) 11/13/2020   MCV 76.2 (L) 11/13/2020   PLT 225 11/13/2020    Assessment / Plan: Induction of labor due to gestational diabetes, cytotec and foley bulb for ripening  Labor: Second dose of cytotec placed, s/p foley bulb placement Preeclampsia:  No S&S of PIH Fetal Wellbeing:  Category I Pain Control:  Labor support without medications I/D:  GBS negative; Covid-19 negative Anticipated MOD:  NSVD  Orlie Pollen, CNM 11/13/2020, 2:17 PM

## 2020-11-13 NOTE — H&P (Signed)
Obstetric H&P   Chief Complaint: IOL for GDM A2  Prenatal Care Provider: [facility]  History of Present Illness: 35 y.o. G2P1001 [redacted]w[redacted]d by 11/28/2020, by Last Menstrual Period presenting to L&D for IOL due to GDM A2, recent US indicated fetus measuring 3 weeks ahead. Concern for adequate glucose control given limited presentation of glucose logs for evaluation during prenatal care. Her medical history is significant for asthma, she is currently using her albuterol inhaler daily, was recently seen for shortness of breath in [facility] triage. Her pregnancy has been complicated by GDM A2, currently taking metformin, obesity, and anemia, taking fe supplementation. Today, patient reports +FM, denies ctx, VB, LOF.  Pregravid weight 85.7 kg Total Weight Gain 15.9 kg  pregnancy Problems (from 05/04/20 to present)    Problem Noted Resolved   Encounter for induction of labor 11/13/2020 by Orlie Pollen, CNM No   Gestational diabetes mellitus (GDM) in third trimester 07/27/2020 by Homero Fellers, MD No   Abnormal glucose tolerance test (GTT) during pregnancy, antepartum 07/07/2020 by Will Bonnet, MD No   Supervision of high risk pregnancy, antepartum 05/04/2020 by Will Bonnet, MD No   Obesity affecting pregnancy 05/04/2020 by Will Bonnet, MD No       Review of Systems: 10 point review of systems negative unless otherwise noted in HPI  Past Medical History: Patient Active Problem List   Diagnosis Date Noted  . Encounter for induction of labor 11/13/2020  . Shortness of breath due to pregnancy in third trimester 10/01/2020  . Asthma 10/01/2020  . COVID-19 affecting pregnancy in third trimester 10/01/2020  . Microcytic anemia 10/01/2020  . Gestational diabetes mellitus (GDM) in third trimester 07/27/2020  . Asthma affecting pregnancy in second trimester 07/22/2020  . Abnormal glucose tolerance test (GTT) during pregnancy, antepartum 07/07/2020  . Supervision of high risk  pregnancy, antepartum 05/04/2020  . Obesity affecting pregnancy 05/04/2020        Past Surgical History: Past Surgical History:  Procedure Laterality Date  . NO PAST SURGERIES    . WISDOM TOOTH EXTRACTION      Past Obstetric History: # 1 - Date: 05/03/12, Sex: Female, Weight: None, GA: None, Delivery: Vaginal, Spontaneous, Apgar1: None, Apgar5: None, Living: Living, Birth Comments: None  # 2 - Date: None, Sex: None, Weight: None, GA: None, Delivery: None, Apgar1: None, Apgar5: None, Living: None, Birth Comments: None   Past Gynecologic History:  Family History: Family History  Problem Relation Age of Onset  . Hypertension Mother   . Hypertension Sister   . Heart disease Maternal Grandfather     Social History: Social History   Socioeconomic History  . Marital status: Single    Spouse name: Not on file  . Number of children: Not on file  . Years of education: Not on file  . Highest education level: Not on file  Occupational History  . Not on file  Tobacco Use  . Smoking status: Former Research scientist (life sciences)  . Smokeless tobacco: Never Used  Vaping Use  . Vaping Use: Never used  Substance and Sexual Activity  . Alcohol use: Not Currently  . Drug use: Never  . Sexual activity: Yes    Birth control/protection: Condom  Other Topics Concern  . Not on file  Social History Narrative   Pt states good support system at home   Social Determinants of Health   Financial Resource Strain: Not on file  Food Insecurity: Not on file  Transportation Needs: Not on file  Physical Activity: Not on file  Stress: Not on file  Social Connections: Not on file  Intimate Partner Violence: Not on file    Medications: Prior to Admission medications   Medication Sig Start Date End Date Taking? Authorizing Provider  Budesonide (PULMICORT FLEXHALER) 90 MCG/ACT inhaler Inhale 1 puff into the lungs 2 (two) times daily. 10/08/20  Yes Schuman, Christanna R, MD  fluticasone (FLOVENT HFA) 110 MCG/ACT  inhaler Inhale 1 puff into the lungs in the morning and at bedtime. 10/28/20  Yes Schuman, Christanna R, MD  metFORMIN (GLUCOPHAGE) 500 MG tablet Take 1 tablet (500 mg total) by mouth at bedtime. 10/02/20  Yes Chanetta Moosman, CNM  Prenat-Methylfol-Chol-Fish Oil (PRENATAL + COMPLETE MULTI) 0.267 & 373 MG THPK Take 1 tablet by mouth daily. 04/07/20  Yes Johnson, Megan P, DO  vitamin B-12 1000 MCG tablet Take 1 tablet (1,000 mcg total) by mouth daily. 10/03/20  Yes Kierston Plasencia, Marolyn Hammock, CNM  albuterol (VENTOLIN HFA) 108 (90 Base) MCG/ACT inhaler Inhale 2 puffs into the lungs every 4 (four) hours as needed for wheezing or shortness of breath. Patient not taking: Reported on 11/10/2020 10/02/20   Orlie Pollen, CNM  dextromethorphan-guaiFENesin [facility] DM) 30-600 MG 12hr tablet Take 1 tablet by mouth 2 (two) times daily as needed for cough. Patient not taking: Reported on 11/13/2020 10/02/20   Orlie Pollen, CNM  ferrous sulfate 325 (65 FE) MG tablet Take 1 tablet (325 mg total) by mouth once for 1 dose. 10/02/20 10/02/20  Orlie Pollen, CNM  ipratropium (ATROVENT HFA) 17 MCG/ACT inhaler Inhale 2 puffs into the lungs every 4 (four) hours. Patient not taking: Reported on 11/13/2020 10/02/20   Orlie Pollen, CNM    Allergies: No Known Allergies  Physical Exam: Vitals: Blood pressure (!) 108/55, pulse (!) 106, temperature 98.3 F (36.8 C), temperature source Oral, resp. rate 18, height 5\' 5"  (1.651 m), weight 101.6 kg, last menstrual period 02/22/2020.  FHT: 140 bpm, moderate variability, +accels, -decels Toco: occasional (7-9 mins)  General: NAD HEENT: normocephalic, anicteric Pulmonary: No increased work of breathing Cardiovascular: RRR, distal pulses 2+ Abdomen: Gravid, non-tender, contractions palpate milde Leopolds: vtx, EFW: 7.5lb Genitourinary: 0.5/thick/posterior/medium Extremities: no edema, erythema, or tenderness Neurologic: Grossly intact Psychiatric: mood appropriate, affect full  Labs: Results for  orders placed or performed during the hospital encounter of 11/13/20 (from the past 24 hour(s))  CBC     Status: Abnormal   Collection Time: 11/13/20  9:22 AM  Result Value Ref Range   WBC 8.5 4.0 - 10.5 K/uL   RBC 4.11 3.87 - 5.11 MIL/uL   Hemoglobin 9.9 (L) 12.0 - 15.0 g/dL   HCT 31.3 (L) 36.0 - 46.0 %   MCV 76.2 (L) 80.0 - 100.0 fL   MCH 24.1 (L) 26.0 - 34.0 pg   MCHC 31.6 30.0 - 36.0 g/dL   RDW 18.8 (H) 11.5 - 15.5 %   Platelets 225 150 - 400 K/uL   nRBC 0.0 0.0 - 0.2 %  Type and screen [facility]     Status: None (Preliminary result)   Collection Time: 11/13/20  9:22 AM  Result Value Ref Range   ABO/RH(D) PENDING    Antibody Screen PENDING    Sample Expiration      11/16/2020,2359 Performed at [facility], [address] [place], [place] [address]   Glucose, serum     Status: Abnormal   Collection Time: 11/13/20  9:22 AM  Result Value Ref Range   Glucose, Bld 126 (  H) 70 - 99 mg/dL    Assessment: 35 y.o. G2P1001 [redacted]w[redacted]d by 11/28/2020, by Last Menstrual Period for IOL due to GDM A2  Plan: 1) Patient will undergo induction of labor with cervical ripening agents.     Patient has been fully informed of the pros and cons, risks and benefits of continued observation with fetal monitoring versus that of induction of labor.   She understands that there are uncommon risks to induction, which include but are not limited to : frequent or prolonged uterine contractions, fetal distress, uterine rupture, and lack of successful induction.  These risks include all methods including Pitocin and Misoprostol.  Patient understands that using Misoprostol for labor induction is an "off label" indication although it has been studied extensively for this purpose and is an accepted method of induction.  She also has been informed of the increased risks for Cesarean with induction and should induction not be successful.  Patient consents to the induction plan of  management.   2) Admission blood glucose 126 - patient reports eating breakfast prior to admission, plan to recheck in 2 hours  3) Fetus - Category I   4) PNL - Blood type --/--/PENDING (04/08 2549) / Anti-bodyscreen PENDING (04/08 8264) / Rubella 2.41 (11/09 1049) / Varicella non-immune / RPR Non Reactive (11/09 1049) / HBsAg Negative (11/09 1049) / HIV Non Reactive (11/09 1049) / GBS Negative/-- (03/30 1456)  5) Immunization History - Per patient report, received Tdap 05/2020 at work; varicella non-immune, plan for varivax postpartum  6) Disposition - Anticipate NSVD  Orlie Pollen, CNM, MSN [facility], [facility] 11/13/2020, 10:12 AM

## 2020-11-13 NOTE — Anesthesia Preprocedure Evaluation (Signed)
Anesthesia Evaluation  Patient identified by MRN, date of birth, ID band Patient awake    Reviewed: Allergy & Precautions, NPO status , Patient's Chart, lab work & pertinent test results  History of Anesthesia Complications Negative for: history of anesthetic complications  Airway Mallampati: III       Dental   Pulmonary asthma , neg sleep apnea, neg COPD, Not current smoker, former smoker,           Cardiovascular hypertension (gestational), (-) Past MI and (-) CHF (-) dysrhythmias (-) Valvular Problems/Murmurs     Neuro/Psych neg Seizures    GI/Hepatic Neg liver ROS, GERD (with pregnancy)  ,  Endo/Other  diabetes, Gestational  Renal/GU negative Renal ROS     Musculoskeletal   Abdominal (+) + obese,   Peds  Hematology   Anesthesia Other Findings   Reproductive/Obstetrics                             Anesthesia Physical Anesthesia Plan  ASA: II  Anesthesia Plan: Epidural   Post-op Pain Management:    Induction:   PONV Risk Score and Plan:   Airway Management Planned:   Additional Equipment:   Intra-op Plan:   Post-operative Plan:   Informed Consent: I have reviewed the patients History and Physical, chart, labs and discussed the procedure including the risks, benefits and alternatives for the proposed anesthesia with the patient or authorized representative who has indicated his/her understanding and acceptance.       Plan Discussed with:   Anesthesia Plan Comments:         Anesthesia Quick Evaluation

## 2020-11-14 ENCOUNTER — Encounter: Payer: Self-pay | Admitting: Obstetrics and Gynecology

## 2020-11-14 DIAGNOSIS — O24425 Gestational diabetes mellitus in childbirth, controlled by oral hypoglycemic drugs: Secondary | ICD-10-CM | POA: Diagnosis not present

## 2020-11-14 DIAGNOSIS — Z3A38 38 weeks gestation of pregnancy: Secondary | ICD-10-CM

## 2020-11-14 LAB — GLUCOSE, CAPILLARY
Glucose-Capillary: 114 mg/dL — ABNORMAL HIGH (ref 70–99)
Glucose-Capillary: 124 mg/dL — ABNORMAL HIGH (ref 70–99)
Glucose-Capillary: 80 mg/dL (ref 70–99)
Glucose-Capillary: 84 mg/dL (ref 70–99)
Glucose-Capillary: 88 mg/dL (ref 70–99)

## 2020-11-14 LAB — RPR: RPR Ser Ql: NONREACTIVE

## 2020-11-14 MED ORDER — DIBUCAINE (PERIANAL) 1 % EX OINT
1.0000 | TOPICAL_OINTMENT | CUTANEOUS | Status: DC | PRN
Start: 2020-11-14 — End: 2020-11-16

## 2020-11-14 MED ORDER — PRENATAL MULTIVITAMIN CH
1.0000 | ORAL_TABLET | Freq: Every day | ORAL | Status: DC
  Administered 2020-11-15: 1 via ORAL
  Filled 2020-11-14: qty 1

## 2020-11-14 MED ORDER — SENNOSIDES-DOCUSATE SODIUM 8.6-50 MG PO TABS
2.0000 | ORAL_TABLET | ORAL | Status: DC
  Administered 2020-11-15: 2 via ORAL
  Filled 2020-11-14: qty 2

## 2020-11-14 MED ORDER — DIPHENHYDRAMINE HCL 25 MG PO CAPS: 25.0000 mg | ORAL_CAPSULE | Freq: Four times a day (QID) | ORAL | Status: DC | PRN

## 2020-11-14 MED ORDER — BUDESONIDE 0.5 MG/2ML IN SUSP
0.5000 mg | Freq: Two times a day (BID) | RESPIRATORY_TRACT | Status: DC
  Administered 2020-11-14 – 2020-11-15 (×2): 0.5 mg via RESPIRATORY_TRACT
  Filled 2020-11-14 (×6): qty 2

## 2020-11-14 MED ORDER — COCONUT OIL OIL: 1.0000 "application " | TOPICAL_OIL | Status: DC | PRN

## 2020-11-14 MED ORDER — ACETAMINOPHEN 325 MG PO TABS: 650.0000 mg | ORAL_TABLET | ORAL | Status: DC | PRN

## 2020-11-14 MED ORDER — BENZOCAINE-MENTHOL 20-0.5 % EX AERO: 1.0000 "application " | INHALATION_SPRAY | CUTANEOUS | Status: DC | PRN

## 2020-11-14 MED ORDER — ONDANSETRON HCL 4 MG/2ML IJ SOLN: 4.0000 mg | INTRAMUSCULAR | Status: DC | PRN

## 2020-11-14 MED ORDER — VARICELLA VIRUS VACCINE LIVE 1350 PFU/0.5ML IJ SUSR
0.5000 mL | INTRAMUSCULAR | Status: DC | PRN
Start: 2020-11-14 — End: 2020-11-16
  Filled 2020-11-14: qty 0.5

## 2020-11-14 MED ORDER — IBUPROFEN 600 MG PO TABS
600.0000 mg | ORAL_TABLET | Freq: Four times a day (QID) | ORAL | Status: DC
  Administered 2020-11-14 – 2020-11-15 (×4): 600 mg via ORAL
  Filled 2020-11-14 (×5): qty 1

## 2020-11-14 MED ORDER — WITCH HAZEL-GLYCERIN EX PADS: 1.0000 "application " | MEDICATED_PAD | CUTANEOUS | Status: DC | PRN

## 2020-11-14 MED ORDER — FERROUS SULFATE 325 (65 FE) MG PO TABS
325.0000 mg | ORAL_TABLET | Freq: Two times a day (BID) | ORAL | Status: DC
  Administered 2020-11-15: 325 mg via ORAL
  Filled 2020-11-14 (×2): qty 1

## 2020-11-14 MED ORDER — HYDROCODONE-ACETAMINOPHEN 5-325 MG PO TABS: 1.0000 | ORAL_TABLET | Freq: Three times a day (TID) | ORAL | Status: DC | PRN

## 2020-11-14 MED ORDER — SIMETHICONE 80 MG PO CHEW
80.0000 mg | CHEWABLE_TABLET | ORAL | Status: DC | PRN
Start: 2020-11-14 — End: 2020-11-16

## 2020-11-14 MED ORDER — ONDANSETRON HCL 4 MG PO TABS
4.0000 mg | ORAL_TABLET | ORAL | Status: DC | PRN
Start: 2020-11-14 — End: 2020-11-16
  Filled 2020-11-14: qty 1

## 2020-11-14 NOTE — Progress Notes (Signed)
Subjective:  Comfortable epidural  Objective:   Vitals: Blood pressure (!) 127/47, pulse 84, temperature 98 F (36.7 C), temperature source Oral, resp. rate 16, height 5\' 5"  (1.651 m), weight 101.6 kg, last menstrual period 02/22/2020, SpO2 94 %. General:  Abdomen: Cervical Exam:  Dilation: 8 Effacement (%): 80 Cervical Position: Posterior Station: -1 Presentation: Vertex Exam by:: Lesia Monica MD  FHT: 145, moderate, +accels, nodecels Toco: q2-29min  Results for orders placed or performed during the hospital encounter of 11/13/20 (from the past 24 hour(s))  CBC     Status: Abnormal   Collection Time: 11/13/20  9:22 AM  Result Value Ref Range   WBC 8.5 4.0 - 10.5 K/uL   RBC 4.11 3.87 - 5.11 MIL/uL   Hemoglobin 9.9 (L) 12.0 - 15.0 g/dL   HCT 31.3 (L) 36.0 - 46.0 %   MCV 76.2 (L) 80.0 - 100.0 fL   MCH 24.1 (L) 26.0 - 34.0 pg   MCHC 31.6 30.0 - 36.0 g/dL   RDW 18.8 (H) 11.5 - 15.5 %   Platelets 225 150 - 400 K/uL   nRBC 0.0 0.0 - 0.2 %  Type and screen [place]     Status: None   Collection Time: 11/13/20  9:22 AM  Result Value Ref Range   ABO/RH(D) O POS    Antibody Screen NEG    Sample Expiration      11/16/2020,2359 Performed at [facility], [address] [place], [place] [address]   Glucose, serum     Status: Abnormal   Collection Time: 11/13/20  9:22 AM  Result Value Ref Range   Glucose, Bld 126 (H) 70 - 99 mg/dL  Glucose, capillary     Status: None   Collection Time: 11/13/20 11:53 AM  Result Value Ref Range   Glucose-Capillary 78 70 - 99 mg/dL  Glucose, capillary     Status: None   Collection Time: 11/13/20  3:09 PM  Result Value Ref Range   Glucose-Capillary 85 70 - 99 mg/dL  Glucose, capillary     Status: None   Collection Time: 11/13/20  8:33 PM  Result Value Ref Range   Glucose-Capillary 88 70 - 99 mg/dL  CBC     Status: Abnormal   Collection Time: 11/13/20  9:37 PM  Result Value Ref Range   WBC 10.7 (H) 4.0 -  10.5 K/uL   RBC 4.00 3.87 - 5.11 MIL/uL   Hemoglobin 9.7 (L) 12.0 - 15.0 g/dL   HCT 30.5 (L) 36.0 - 46.0 %   MCV 76.3 (L) 80.0 - 100.0 fL   MCH 24.3 (L) 26.0 - 34.0 pg   MCHC 31.8 30.0 - 36.0 g/dL   RDW 18.7 (H) 11.5 - 15.5 %   Platelets 227 150 - 400 K/uL   nRBC 0.0 0.0 - 0.2 %  Comprehensive metabolic panel     Status: Abnormal   Collection Time: 11/13/20  9:37 PM  Result Value Ref Range   Sodium 134 (L) 135 - 145 mmol/L   Potassium 3.7 3.5 - 5.1 mmol/L   Chloride 106 98 - 111 mmol/L   CO2 19 (L) 22 - 32 mmol/L   Glucose, Bld 79 70 - 99 mg/dL   BUN 5 (L) 6 - 20 mg/dL   Creatinine, Ser 0.44 0.44 - 1.00 mg/dL   Calcium 8.7 (L) 8.9 - 10.3 mg/dL   Total Protein 6.4 (L) 6.5 - 8.1 g/dL   Albumin 2.8 (L) 3.5 - 5.0 g/dL  AST 23 15 - 41 U/L   ALT 12 0 - 44 U/L   Alkaline Phosphatase 159 (H) 38 - 126 U/L   Total Bilirubin 0.4 0.3 - 1.2 mg/dL   GFR, Estimated >60 >60 mL/min   Anion gap 9 5 - 15  Protein / creatinine ratio, urine     Status: Abnormal   Collection Time: 11/13/20  9:58 PM  Result Value Ref Range   Creatinine, Urine 49 mg/dL   Total Protein, Urine 9 mg/dL   Protein Creatinine Ratio 0.18 (H) 0.00 - 0.15 mg/mg[Cre]  Glucose, capillary     Status: None   Collection Time: 11/14/20 12:09 AM  Result Value Ref Range   Glucose-Capillary 84 70 - 99 mg/dL    Assessment:   35 y.o. G2P1001 [redacted]w[redacted]d IOL gestational diabetes, fetopathy AC accel 3 weeks  Plan:   1) Labor - continue to titrate pitocin  2) Fetus - cat I tracing  3) GDM BG remains well controlled  Malachy Mood, MD, [facility], [facility] 11/14/2020, 4:10 AM

## 2020-11-14 NOTE — Lactation Note (Signed)
This note was copied from a baby's chart. Lactation Consultation Note  Patient Name: Caitlin Ortega [id number] Date: 11/14/2020 Reason for consult: Early term 37-38.6wks;Initial assessment Age:33 hours  Initial visit to 4 hours old of a P2 mother with breastfeeding experience. Mother states breastfeeding is going well. Mother explains she started bottle-feeding formula because she wants infant to get familiar with both. Last feeding baby breastfed for ~84minutes and bottlefed 10 mL of formula.    Reviewed with mother average size of a NB stomach and formula supplementation guidelines. Discussed pace bottle feeding benefits and demonstrated technique to prevent emesis. Encourage to follow babies' hunger and fullness cues. Reviewed importance to offer the breast 8 to 12 times in a 24-hour period for proper stimulation and to establish good milk supply. Reviewed signs of good milk transfer. Discussed milk coming to volume. Promoted maternal rest, hydration and food intake. Reviewed newborn behavior and expectations with mother and encouraged to contact [facility] for support, questions or concerns.    All questions answered at this time. .   Maternal Data Has patient been taught Hand Expression?: Yes Does the patient have breastfeeding experience prior to this delivery?: Yes How long did the patient breastfeed?: 24 months to her now 35y.o.  Feeding Mother's Current Feeding Choice: Breast Milk and Formula  Interventions Interventions: Breast feeding basics reviewed;Skin to skin;Expressed milk;Education  Discharge Pump: Personal (ordered) WIC Program: Yes  Consult Status Consult Status: Follow-up Date: 11/15/20 Follow-up type: In-patient    Caitlin Ortega 11/14/2020, 3:59 PM

## 2020-11-14 NOTE — Progress Notes (Signed)
Labor Check  Subj:  Complaints: feels more constant pressure with contractions   Obj:  BP (!) 131/48   Pulse 79   Temp 98.9 F (37.2 C) (Oral)   Resp 18   Ht 5\' 5"  (1.651 m)   Wt 101.6 kg   LMP 02/22/2020   SpO2 100%   BMI 37.28 kg/m  Dose (milli-units/min) Oxytocin: 10 milli-units/min  Cervix: Dilation: 10 / Effacement (%): 100 / Station: Plus 1  Baseline FHR: 140 beats/min   Variability: moderate   Accelerations: present   Decelerations: present (occasional variables, some possible intermittent late decelerations).  Contractions: present frequency: ~3 q 10 min Overall assessment: cat 2  Female chaperone present for pelvic exam:   A/P: 35 y.o. G2P1001 female at [redacted]w[redacted]d with IOL for uncontrolled GDM.  1.  Labor: continued pitocin per protocol.  Last MVUs were about 180.  Expect vaginal delivery.  Given last EFW, prepare for possible shoulder dystocia.   Labor down for 30-60 minutes and then attempt pushing.  2.  FWB: reassuring overall, Overall assessment: category 2 (mostly this is category 1 with intermittent variables being the leading driver of category 2.  The occasional late decelerations are followed by accelerations with moderate variability).  Continue position adjustment.    3.  GBS negative at [redacted]w[redacted]d  4.  Pain: epidural 5.  Recheck: in 30-60 minutes or sooner, if indicated.     Prentice Docker, MD, [facility], [facility] 11/14/2020 9:03 AM

## 2020-11-14 NOTE — Discharge Summary (Signed)
Postpartum Discharge Summary   Patient Name: Caitlin Ortega DOB: 10/20/1985 MRN: [id number]  Date of admission: 11/13/2020 Delivery date:11/14/2020  Delivering provider: Prentice Docker D  Date of discharge: 11/15/2020  Admitting diagnosis: Pregnancy [Z34.90] Intrauterine pregnancy: [redacted]w[redacted]d    Secondary diagnosis:  Active Problems:   Supervision of high risk pregnancy, antepartum   Obesity affecting pregnancy   Asthma affecting pregnancy in second trimester   Gestational diabetes mellitus (GDM) in third trimester   Encounter for induction of labor   [redacted] weeks gestation of pregnancy  Additional problems: none    Discharge diagnosis: Term Pregnancy Delivered and GDM A2                                              Post partum procedures:none Augmentation: AROM, Pitocin and Cytotec Complications: None  Hospital course: Induction of Labor With Vaginal Delivery   35y.o. yo G2P1001 at 338w0das admitted to the hospital 11/13/2020 for induction of labor.  Indication for induction: A2 DM.  Patient had an uncomplicated labor course as follows: Membrane Rupture Time/Date: 9:40 PM ,11/13/2020   Delivery Method:Vaginal, Spontaneous  Episiotomy: None  Lacerations:  None  Details of delivery can be found in separate delivery note.  Patient had a routine postpartum course. Patient is discharged home 11/15/20.  Newborn Data: Birth date:11/14/2020  Birth time:10:26 AM  Gender:Female  Living status:Living  Apgars:8 ,9  Weight:3850 g   Magnesium Sulfate received: No BMZ received: No Rhophylac:N/A MMR:No T-DaP:Given prenatally received on 11/26/2020 Flu: No -declined Transfusion:No  Physical exam  Vitals:   11/14/20 2021 11/15/20 0005 11/15/20 0815 11/15/20 0823  BP: 114/72 123/67 (!) 80/52 109/60  Pulse: 90 (!) 102 83   Resp:  20 20   Temp: 98 F (36.7 C) 98.2 F (36.8 C) 98.4 F (36.9 C)   TempSrc: Oral Oral Oral   SpO2: 99% 98% 95%   Weight:      Height:       General: alert,  cooperative and no distress Lochia: appropriate Uterine Fundus: firm Incision: N/A DVT Evaluation: No evidence of DVT seen on physical exam. No cords or calf tenderness. No significant calf/ankle edema. Labs: Lab Results  Component Value Date   WBC 15.2 (H) 11/15/2020   HGB 9.4 (L) 11/15/2020   HCT 29.4 (L) 11/15/2020   MCV 76.6 (L) 11/15/2020   PLT 202 11/15/2020   CMP Latest Ref Rng & Units 11/13/2020  Glucose 70 - 99 mg/dL 79  BUN 6 - 20 mg/dL 5(L)  Creatinine 0.44 - 1.00 mg/dL 0.44  Sodium 135 - 145 mmol/L 134(L)  Potassium 3.5 - 5.1 mmol/L 3.7  Chloride 98 - 111 mmol/L 106  CO2 22 - 32 mmol/L 19(L)  Calcium 8.9 - 10.3 mg/dL 8.7(L)  Total Protein 6.5 - 8.1 g/dL 6.4(L)  Total Bilirubin 0.3 - 1.2 mg/dL 0.4  Alkaline Phos 38 - 126 U/L 159(H)  AST 15 - 41 U/L 23  ALT 0 - 44 U/L 12   Edinburgh Score: No flowsheet data found.    After visit meds:  Allergies as of 11/15/2020   No Known Allergies     Medication List    STOP taking these medications   albuterol 108 (90 Base) MCG/ACT inhaler Commonly known as: VENTOLIN HFA   dextromethorphan-guaiFENesin 30-600 MG 12hr tablet Commonly known as: MUShelbyM  ipratropium 17 MCG/ACT inhaler Commonly known as: ATROVENT HFA   metFORMIN 500 MG tablet Commonly known as: GLUCOPHAGE     TAKE these medications   acetaminophen 500 MG tablet Commonly known as: TYLENOL Take 2 tablets (1,000 mg total) by mouth every 8 (eight) hours as needed for mild pain.   cyanocobalamin 1000 MCG tablet Take 1 tablet (1,000 mcg total) by mouth daily.   ferrous sulfate 325 (65 FE) MG tablet Take 1 tablet (325 mg total) by mouth once for 1 dose.   fluticasone 110 MCG/ACT inhaler Commonly known as: FLOVENT HFA Inhale 1 puff into the lungs in the morning and at bedtime.   ibuprofen 600 MG tablet Commonly known as: ADVIL Take 1 tablet (600 mg total) by mouth every 6 (six) hours.   Prenatal + Complete Multi 0.267 & 373 MG Thpk Take 1  tablet by mouth daily.   Pulmicort Flexhaler 90 MCG/ACT inhaler Generic drug: Budesonide Inhale 1 puff into the lungs 2 (two) times daily.            Discharge Care Instructions  (From admission, onward)         Start     Ordered   11/15/20 0000  Discharge wound care:       Comments: Perform wound care instructions   11/15/20 0904           Discharge home in stable condition Infant Feeding: Breast Infant Disposition:home with mother Discharge instruction: per After Visit Summary and Postpartum booklet. Activity: Advance as tolerated. Pelvic rest for 6 weeks.  Diet: routine diet Anticipated Birth Control: Unsure Postpartum Appointment:6 weeks Additional Postpartum F/U: 2 hour GTT Future Appointments:No future appointments. Follow up Visit:  Follow-up Information    Will Bonnet, MD. Schedule an appointment as soon as possible for a visit in 6 week(s).   Specialty: Obstetrics and Gynecology Why: Needs 2 hour glucose tolerance test and 6 weeks postpartum visit Contact information: [address] [place] [address] [phone]              SIGNED:  Prentice Docker, MD, [facility] 11/15/2020 9:06 AM

## 2020-11-14 NOTE — Progress Notes (Signed)
Subjective:  Sleeping  Objective:   Vitals: Blood pressure (!) 104/52, pulse 75, temperature 97.8 F (36.6 C), temperature source Oral, resp. rate 18, height 5\' 5"  (1.651 m), weight 101.6 kg, last menstrual period 02/22/2020, SpO2 95 %. General: NAD Abdomen:gravid, non-tender Cervical Exam:  Dilation: 6 Effacement (%): 80 Cervical Position: Posterior Station: 0 Presentation: Vertex Exam by:: Fiona Coto MD  FHT: 140, moderate, +accels, no decels Toco: q2-75min  Results for orders placed or performed during the hospital encounter of 11/13/20 (from the past 24 hour(s))  CBC     Status: Abnormal   Collection Time: 11/13/20  9:22 AM  Result Value Ref Range   WBC 8.5 4.0 - 10.5 K/uL   RBC 4.11 3.87 - 5.11 MIL/uL   Hemoglobin 9.9 (L) 12.0 - 15.0 g/dL   HCT 31.3 (L) 36.0 - 46.0 %   MCV 76.2 (L) 80.0 - 100.0 fL   MCH 24.1 (L) 26.0 - 34.0 pg   MCHC 31.6 30.0 - 36.0 g/dL   RDW 18.8 (H) 11.5 - 15.5 %   Platelets 225 150 - 400 K/uL   nRBC 0.0 0.0 - 0.2 %  Type and screen [place]     Status: None   Collection Time: 11/13/20  9:22 AM  Result Value Ref Range   ABO/RH(D) O POS    Antibody Screen NEG    Sample Expiration      11/16/2020,2359 Performed at [facility], [address] [place], [place] [address]   Glucose, serum     Status: Abnormal   Collection Time: 11/13/20  9:22 AM  Result Value Ref Range   Glucose, Bld 126 (H) 70 - 99 mg/dL  Glucose, capillary     Status: None   Collection Time: 11/13/20 11:53 AM  Result Value Ref Range   Glucose-Capillary 78 70 - 99 mg/dL  Glucose, capillary     Status: None   Collection Time: 11/13/20  3:09 PM  Result Value Ref Range   Glucose-Capillary 85 70 - 99 mg/dL  Glucose, capillary     Status: None   Collection Time: 11/13/20  8:33 PM  Result Value Ref Range   Glucose-Capillary 88 70 - 99 mg/dL  CBC     Status: Abnormal   Collection Time: 11/13/20  9:37 PM  Result Value Ref Range   WBC 10.7  (H) 4.0 - 10.5 K/uL   RBC 4.00 3.87 - 5.11 MIL/uL   Hemoglobin 9.7 (L) 12.0 - 15.0 g/dL   HCT 30.5 (L) 36.0 - 46.0 %   MCV 76.3 (L) 80.0 - 100.0 fL   MCH 24.3 (L) 26.0 - 34.0 pg   MCHC 31.8 30.0 - 36.0 g/dL   RDW 18.7 (H) 11.5 - 15.5 %   Platelets 227 150 - 400 K/uL   nRBC 0.0 0.0 - 0.2 %  Comprehensive metabolic panel     Status: Abnormal   Collection Time: 11/13/20  9:37 PM  Result Value Ref Range   Sodium 134 (L) 135 - 145 mmol/L   Potassium 3.7 3.5 - 5.1 mmol/L   Chloride 106 98 - 111 mmol/L   CO2 19 (L) 22 - 32 mmol/L   Glucose, Bld 79 70 - 99 mg/dL   BUN 5 (L) 6 - 20 mg/dL   Creatinine, Ser 0.44 0.44 - 1.00 mg/dL   Calcium 8.7 (L) 8.9 - 10.3 mg/dL   Total Protein 6.4 (L) 6.5 - 8.1 g/dL   Albumin 2.8 (L) 3.5 - 5.0 g/dL  AST 23 15 - 41 U/L   ALT 12 0 - 44 U/L   Alkaline Phosphatase 159 (H) 38 - 126 U/L   Total Bilirubin 0.4 0.3 - 1.2 mg/dL   GFR, Estimated >60 >60 mL/min   Anion gap 9 5 - 15  Protein / creatinine ratio, urine     Status: Abnormal   Collection Time: 11/13/20  9:58 PM  Result Value Ref Range   Creatinine, Urine 49 mg/dL   Total Protein, Urine 9 mg/dL   Protein Creatinine Ratio 0.18 (H) 0.00 - 0.15 mg/mg[Cre]    Assessment:   35 y.o. G2P1001 [redacted]w[redacted]d IOL noncompliant GDM and fetopathy  Plan:   1) Labor - contractions spaced out start pitocin, internals placed  2) Fetus - cat I tracing  Malachy Mood, MD, [facility], [facility] 11/14/2020, 12:00 AM

## 2020-11-15 LAB — CBC
HCT: 29.4 % — ABNORMAL LOW (ref 36.0–46.0)
Hemoglobin: 9.4 g/dL — ABNORMAL LOW (ref 12.0–15.0)
MCH: 24.5 pg — ABNORMAL LOW (ref 26.0–34.0)
MCHC: 32 g/dL (ref 30.0–36.0)
MCV: 76.6 fL — ABNORMAL LOW (ref 80.0–100.0)
Platelets: 202 10*3/uL (ref 150–400)
RBC: 3.84 MIL/uL — ABNORMAL LOW (ref 3.87–5.11)
RDW: 19 % — ABNORMAL HIGH (ref 11.5–15.5)
WBC: 15.2 10*3/uL — ABNORMAL HIGH (ref 4.0–10.5)
nRBC: 0 % (ref 0.0–0.2)

## 2020-11-15 LAB — GLUCOSE, CAPILLARY: Glucose-Capillary: 109 mg/dL — ABNORMAL HIGH (ref 70–99)

## 2020-11-15 MED ORDER — IBUPROFEN 600 MG PO TABS: 600.0000 mg | ORAL_TABLET | Freq: Four times a day (QID) | ORAL | 0 refills | Status: DC

## 2020-11-15 MED ORDER — ACETAMINOPHEN 500 MG PO TABS
1000.0000 mg | ORAL_TABLET | Freq: Three times a day (TID) | ORAL | Status: DC | PRN
Start: 2020-11-15 — End: 2022-03-07

## 2020-11-15 MED ORDER — BUDESONIDE 180 MCG/ACT IN AEPB
1.0000 | INHALATION_SPRAY | Freq: Two times a day (BID) | RESPIRATORY_TRACT | Status: DC | PRN
  Filled 2020-11-15: qty 1

## 2020-11-15 NOTE — Anesthesia Postprocedure Evaluation (Signed)
Anesthesia Post Note  Patient: Caitlin Ortega  Procedure(s) Performed: AN AD HOC LABOR EPIDURAL  Patient location during evaluation: Mother Baby Anesthesia Type: Epidural Level of consciousness: awake and alert Pain management: pain level controlled Vital Signs Assessment: post-procedure vital signs reviewed and stable Respiratory status: spontaneous breathing, nonlabored ventilation and respiratory function stable Cardiovascular status: stable Postop Assessment: no headache, no backache, patient able to bend at knees and able to ambulate Anesthetic complications: no   No complications documented.   Last Vitals:  Vitals:   11/15/20 0815 11/15/20 0823  BP: (!) 80/52 109/60  Pulse: 83   Resp: 20   Temp: 36.9 C   SpO2: 95%     Last Pain:  Vitals:   11/15/20 0815  TempSrc: Oral  PainSc:                  Precious Haws Jackson Fetters

## 2020-11-15 NOTE — Progress Notes (Signed)
Reviewed D/C instructions with pt and family. Pt verbalized understanding of teaching. Discharged to home via W/C. Pt to schedule f/u appt.  

## 2020-11-15 NOTE — Lactation Note (Signed)
This note was copied from a baby's chart. Lactation Consultation Note  Patient Name: Boy Evamae Rowen [id number] Date: 11/15/2020 Reason for consult: Early term 37-38.6wks;Follow-up assessment Age:33 hours Lactation Rounds: LC to the room for a visit. Mother is resting abby is not in the room due to circ.  Mother states feeds are going well but she does not have any milk yet and she is giving formula because her first child would not take a bottle and she does not want to do that again. LC reviewed and encouraged feeding on demand and with cues. Encouraged breast prior to bottle and to pump if she feels like she is giving lots of formula. If baby is not cueing we encourage hand expression and spoon feed to wake baby. Reviewed diaper counts for days of life and when to call Peds with questions. Reviewed "understanding Postpartum and Newborn care " booklet at bedside. Reviewed outpatient Lactation number and resources. Reviewed pacifier, pumping, and bottles are not encouraged until breastfeeding is established and going well in the first 4 weeks. Mother was given a hand pump and reviewed set up and cleaning. She has also ordered an Motif pump for home. Mother stated understanding with all teaching.   Maternal Data Has patient been taught Hand Expression?: Yes Does the patient have breastfeeding experience prior to this delivery?: Yes How long did the patient breastfeed?: 2.5 years  Feeding Mother's Current Feeding Choice: Breast Milk and Formula  Interventions Interventions: Breast feeding basics reviewed;Hand express;Hand pump;Education  Discharge Discharge Education: Engorgement and breast care;Warning signs for feeding baby Pump: Manual;Personal (She has ordered a Motif pump through Medicaid) WIC Program: Yes  Consult Status Consult Status: PRN Follow-up type: Call as needed    Christyne Mccain D Shonte Soderlund 11/15/2020, 5:05 PM

## 2020-11-16 ENCOUNTER — Telehealth: Payer: Self-pay | Admitting: *Deleted

## 2020-11-16 NOTE — Telephone Encounter (Signed)
Transition Care Management Unsuccessful Follow-up Telephone Call  Date of discharge and from where:  11/15/2020 [facility]   Attempts:  1st Attempt  Reason for unsuccessful TCM follow-up call:  Left voice message

## 2020-11-17 NOTE — Telephone Encounter (Signed)
Transition Care Management Unsuccessful Follow-up Telephone Call  Date of discharge and from where:  11/15/2020 [facility]  Attempts:  2nd Attempt  Reason for unsuccessful TCM follow-up call:  Left voice message

## 2020-11-18 NOTE — Telephone Encounter (Signed)
Transition Care Management Unsuccessful Follow-up Telephone Call  Date of discharge and from where:  11/15/2020 [facility]   Attempts:  3rd Attempt  Reason for unsuccessful TCM follow-up call:  Left voice message

## 2020-12-06 IMAGING — US US PELVIS COMPLETE WITH TRANSVAGINAL
1 series · 13 of 25 positions shown · non-contrast
Comparison: None

CLINICAL DATA: Initial evaluation for menorrhagia with regular
cycle.



[Series 1: us pelvis complete with transvaginal · 13 of 118 slices shown]
[im 1/118]
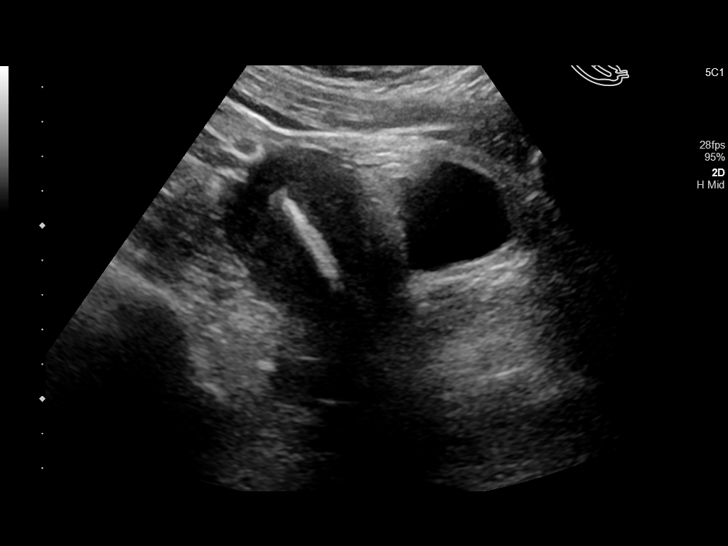
[im 10/118]
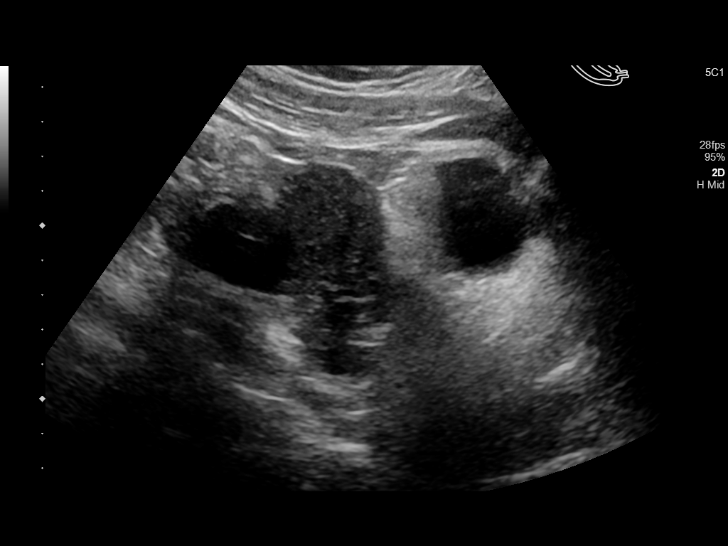
[im 20/118]
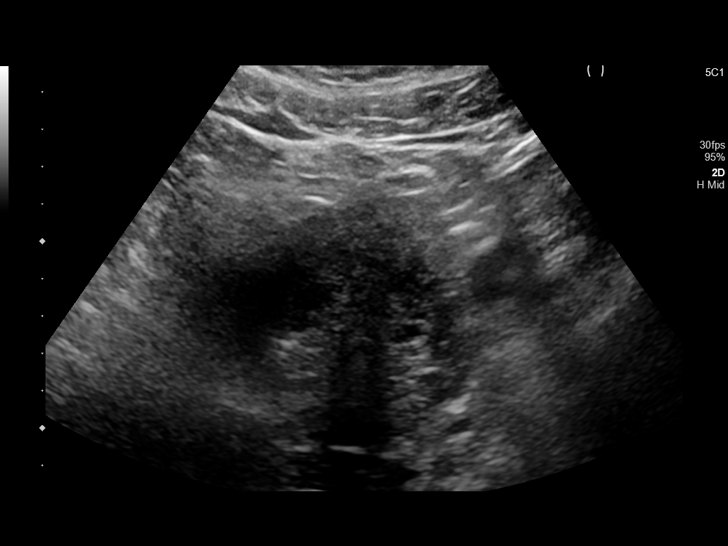
[im 30/118]
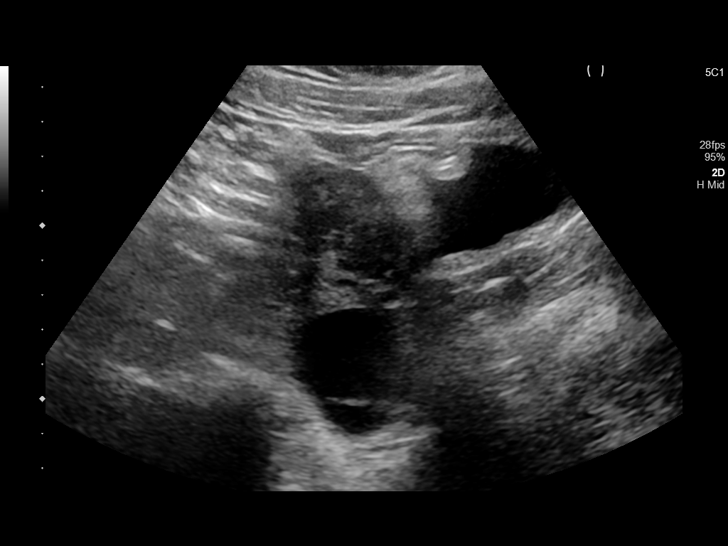
[im 40/118]
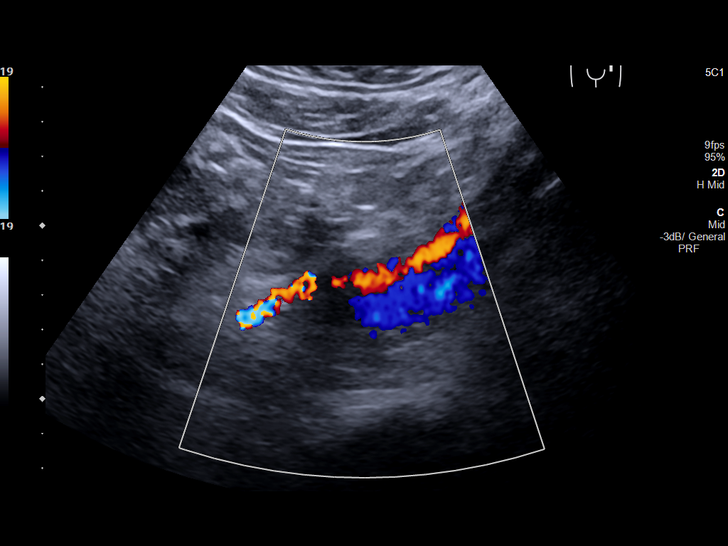
[im 49/118]
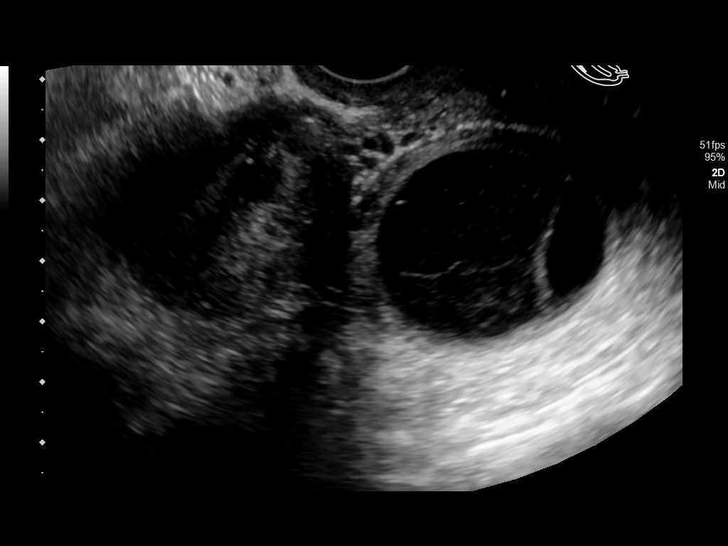
[im 59/118]
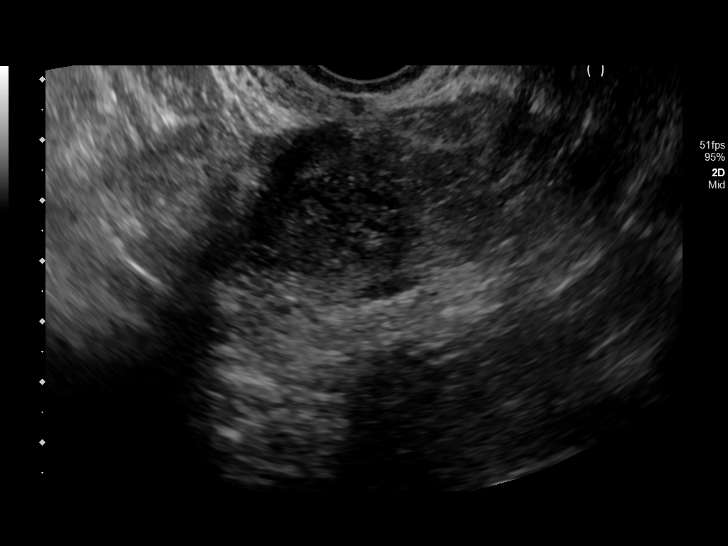
[im 69/118]
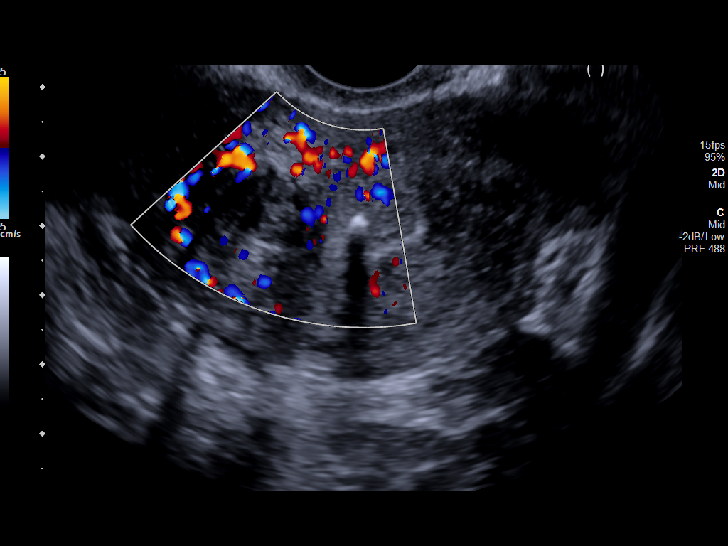
[im 79/118]
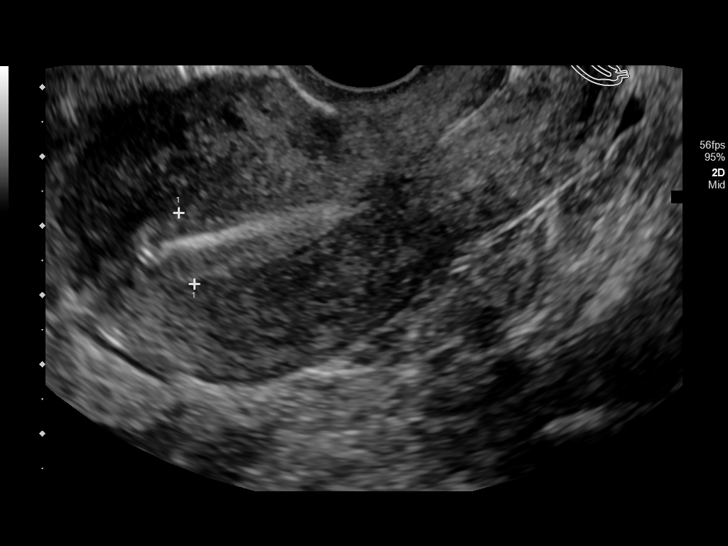
[im 88/118]
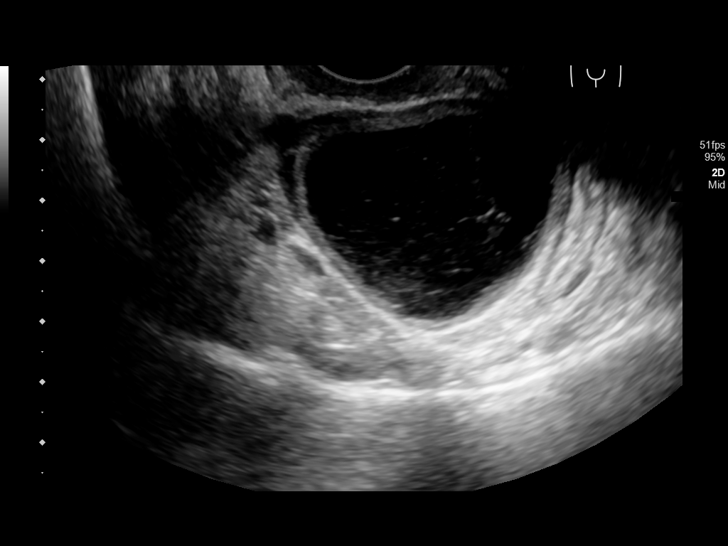
[im 98/118]
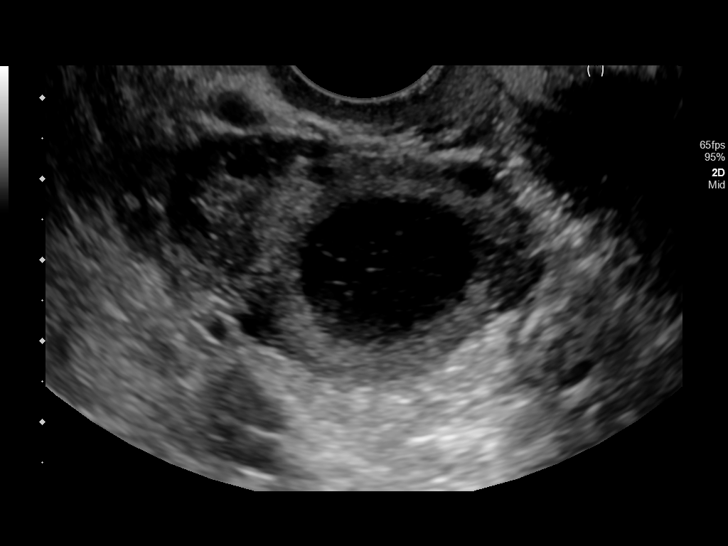
[im 108/118]
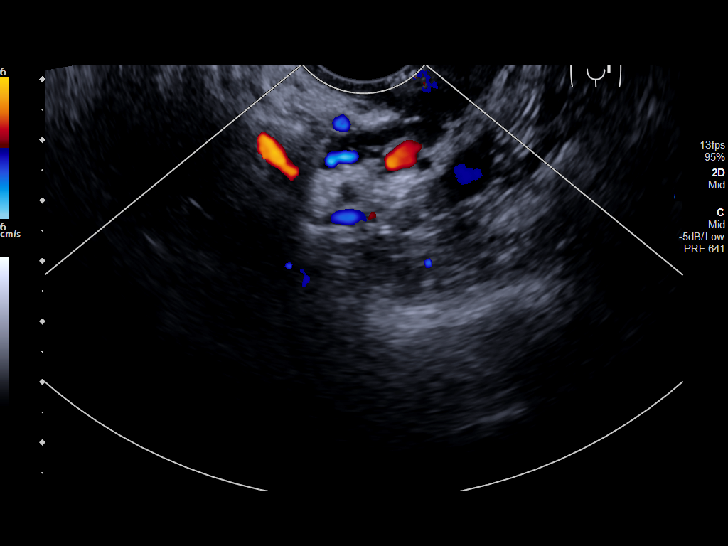
[im 118/118]
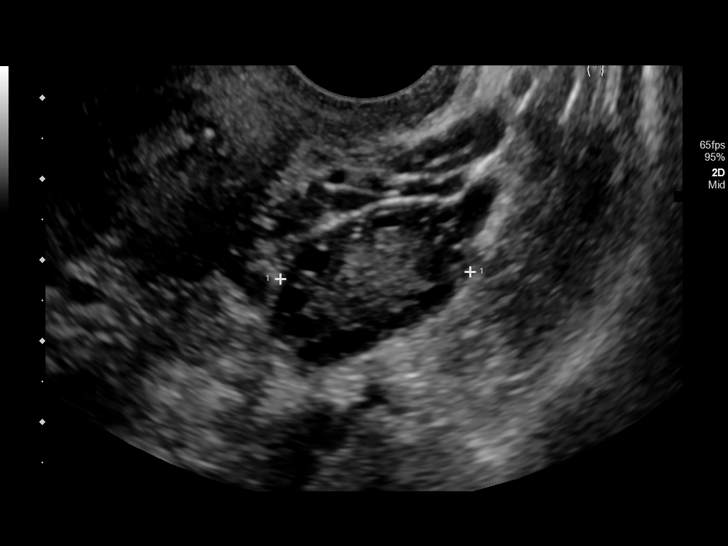

[13 of 25 positions shown; findings below may reference images not displayed]

FINDINGS: Uterus

Measurements: 8.4 x 4.7 x 6.0 cm = volume: 123 mL. 2.7 x 1.9 x
cm exophytic fibroid extends from the uterine fundus. 1.0 x 1.3 x
1.5 cm intramural fibroid at the right posterior uterine body.

Endometrium

Thickness: 10.5 mm. No focal abnormality visualized. IUD in
appropriate position within the endometrial cavity.

Right ovary

Measurements: 5.4 x 3.4 x 4.9 cm = volume: 46 mL. 4.1 x 3.0 x 4.3 cm
complex cyst with internal lace-like architecture, most consistent
with a benign hemorrhagic cyst. No associated vascularity or other
concerning features.

Left ovary

Measurements: 2.6 x 1.6 x 2.3 cm = volume: 5.0 mL. Normal
appearance/no adnexal mass.

Other findings

Small volume free fluid present within the pelvis.
IMPRESSION: 1. Endometrial stripe measures 10.5 mm in thickness. If bleeding
remains unresponsive to hormonal or medical therapy, sonohysterogram
should be considered for focal lesion work-up. (Ref: Radiological
Reasoning: Algorithmic Workup of Abnormal Vaginal Bleeding with
Endovaginal Sonography and Sonohysterography. AJR [id number]; 191:S68-73).
2. Fibroid uterus as detailed above.
3. 4.3 cm complex right ovarian cyst, most consistent with a benign
hemorrhagic cyst. This is almost certainly benign, with no follow-up
imaging recommended regarding this lesion.
4. IUD in appropriate position within the endometrial cavity.

## 2020-12-07 NOTE — Telephone Encounter (Signed)
Flovent inhaler was rx'd.

## 2020-12-30 DIAGNOSIS — Z20822 Contact with and (suspected) exposure to covid-19: Secondary | ICD-10-CM | POA: Diagnosis not present

## 2020-12-30 DIAGNOSIS — J069 Acute upper respiratory infection, unspecified: Secondary | ICD-10-CM | POA: Diagnosis not present

## 2021-01-13 ENCOUNTER — Ambulatory Visit: Payer: Medicaid Other | Admitting: Obstetrics and Gynecology

## 2021-01-20 ENCOUNTER — Ambulatory Visit: Payer: Medicaid Other | Admitting: Obstetrics and Gynecology

## 2021-09-24 DIAGNOSIS — Z20822 Contact with and (suspected) exposure to covid-19: Secondary | ICD-10-CM | POA: Diagnosis not present

## 2021-09-24 DIAGNOSIS — R070 Pain in throat: Secondary | ICD-10-CM | POA: Diagnosis not present

## 2021-09-24 DIAGNOSIS — J039 Acute tonsillitis, unspecified: Secondary | ICD-10-CM | POA: Diagnosis not present

## 2022-03-07 ENCOUNTER — Emergency Department: Payer: Medicaid Other

## 2022-03-07 ENCOUNTER — Emergency Department
Admission: EM | Admit: 2022-03-07 | Discharge: 2022-03-07 | Disposition: A | Discharge status: Home / Self Care | Payer: Medicaid Other | Attending: Emergency Medicine | Admitting: Emergency Medicine

## 2022-03-07 ENCOUNTER — Other Ambulatory Visit: Payer: Self-pay

## 2022-03-07 DIAGNOSIS — M542 Cervicalgia: Secondary | ICD-10-CM | POA: Diagnosis not present

## 2022-03-07 DIAGNOSIS — S6992XA Unspecified injury of left wrist, hand and finger(s), initial encounter: Secondary | ICD-10-CM | POA: Diagnosis not present

## 2022-03-07 DIAGNOSIS — M79632 Pain in left forearm: Secondary | ICD-10-CM | POA: Diagnosis not present

## 2022-03-07 DIAGNOSIS — Y92410 Unspecified street and highway as the place of occurrence of the external cause: Secondary | ICD-10-CM | POA: Insufficient documentation

## 2022-03-07 DIAGNOSIS — S59912A Unspecified injury of left forearm, initial encounter: Secondary | ICD-10-CM | POA: Diagnosis not present

## 2022-03-07 DIAGNOSIS — M79642 Pain in left hand: Secondary | ICD-10-CM | POA: Diagnosis not present

## 2022-03-07 DIAGNOSIS — J45909 Unspecified asthma, uncomplicated: Secondary | ICD-10-CM | POA: Insufficient documentation

## 2022-03-07 MED ORDER — CYCLOBENZAPRINE HCL 10 MG PO TABS: 10.0000 mg | ORAL_TABLET | Freq: Three times a day (TID) | ORAL | 0 refills | Status: AC | PRN

## 2022-03-07 MED ORDER — ACETAMINOPHEN 500 MG PO TABS: 1000.0000 mg | ORAL_TABLET | Freq: Four times a day (QID) | ORAL | 0 refills | Status: DC | PRN

## 2022-03-07 NOTE — Discharge Instructions (Addendum)
-  You may take Tylenol as needed for pain.  You may take cyclobenzaprine as well for muscle relaxation, to use caution as it may make you drowsy.  -Look out for signs of concussion.  Review the educational material provided.  Follow-up with your primary care provider as needed.  -Return to the emergency department anytime if you begin to experience any new or worsening symptoms.
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
## 2022-05-30 ENCOUNTER — Encounter: Payer: Self-pay | Admitting: Family Medicine

## 2022-05-30 ENCOUNTER — Ambulatory Visit: Payer: Medicaid Other | Admitting: Family Medicine

## 2022-05-30 VITALS — BP 91/51 | HR 54 | Temp 97.9°F | Wt 164.4 lb

## 2022-05-30 DIAGNOSIS — Z8632 Personal history of gestational diabetes: Secondary | ICD-10-CM

## 2022-05-30 DIAGNOSIS — M79602 Pain in left arm: Secondary | ICD-10-CM | POA: Insufficient documentation

## 2022-05-30 DIAGNOSIS — E559 Vitamin D deficiency, unspecified: Secondary | ICD-10-CM | POA: Diagnosis not present

## 2022-05-30 DIAGNOSIS — R102 Pelvic and perineal pain: Secondary | ICD-10-CM

## 2022-05-30 DIAGNOSIS — Z0000 Encounter for general adult medical examination without abnormal findings: Secondary | ICD-10-CM | POA: Diagnosis not present

## 2022-05-30 MED ORDER — CYCLOBENZAPRINE HCL 10 MG PO TABS: 5.0000 mg | ORAL_TABLET | Freq: Every evening | ORAL | 1 refills | Status: DC | PRN

## 2022-05-30 NOTE — Assessment & Plan Note (Signed)
X-ray in July showed flattened cervical lordosis. Likely secondary to muscle spasms. Will treat with flexeril and stretches and recheck in about a month. Patient is about to lose her medicaid and would like to see sports med as she is trying this- referral generated today. Call with any concerns. Recheck 3 weeks.

## 2022-05-30 NOTE — Progress Notes (Signed)
BP (!) 91/51   Pulse (!) 54   Temp 97.9 F (36.6 C)   Wt 164 lb 6.4 oz (74.6 kg)   LMP 05/30/2022 (Exact Date)   SpO2 98%   Breastfeeding Yes   BMI 27.36 kg/m    Subjective:    Patient ID: Caitlin Ortega, female    DOB: March 08, 1986, 36 y.o.   MRN: [id number]  HPI: Caitlin Ortega is a 36 y.o. female presenting on 05/30/2022 for comprehensive medical examination. Current medical complaints include:  Has been having pelvic pain- worse around her period.   ARM PAIN Duration: 6+ months Location: whole arm Mechanism of injury: unknown Onset: gradual Severity: moderate  Quality:  sharp Frequency: intermittent Radiation: yes- down the arm Aggravating factors: exercise  Alleviating factors: aleve, icing  Status: worse Treatments attempted: ice and aleve  Relief with NSAIDs?:  significant Swelling: no Redness: no  Warmth: no Trauma: no Chest pain: no  Shortness of breath: no  Fever: no Decreased sensation: no Paresthesias: no Weakness: no  Menopausal Symptoms: no  Depression Screen done today and results listed below:     05/30/2022    3:27 PM 08/06/2020    1:43 PM 02/03/2020   10:35 AM 01/28/2019    3:23 PM 05/14/2018    2:35 PM  Depression screen PHQ 2/9  Decreased Interest 0 0 0 0 1  Down, Depressed, Hopeless 0 0 0 0 1  PHQ - 2 Score 0 0 0 0 2  Altered sleeping 0   3 0  Tired, decreased energy '2   1 2  '$ Change in appetite 1   0 1  Feeling bad or failure about yourself  0   0 3  Trouble concentrating 3   0 1  Moving slowly or fidgety/restless 3   0 1  Suicidal thoughts 0   0 0  PHQ-9 Score '9   4 10  '$ Difficult doing work/chores Somewhat difficult   Not difficult at all Not difficult at all    Past Medical History:  Past Medical History:  Diagnosis Date   Allergy    Anemia    Asthma    Gestational diabetes     Surgical History:  Past Surgical History:  Procedure Laterality Date   NO PAST SURGERIES     WISDOM TOOTH EXTRACTION      Medications:   No current outpatient medications on file prior to visit.   No current facility-administered medications on file prior to visit.    Allergies:  No Known Allergies  Social History:  Social History   Socioeconomic History   Marital status: Single    Spouse name: Not on file   Number of children: Not on file   Years of education: Not on file   Highest education level: Not on file  Occupational History   Not on file  Tobacco Use   Smoking status: Former   Smokeless tobacco: Never  Vaping Use   Vaping Use: Never used  Substance and Sexual Activity   Alcohol use: Not Currently   Drug use: Never   Sexual activity: Yes    Birth control/protection: Condom  Other Topics Concern   Not on file  Social History Narrative   Pt states good support system at home   Social Determinants of Health   Financial Resource Strain: Not on file  Food Insecurity: Not on file  Transportation Needs: Not on file  Physical Activity: Not on file  Stress: Not on file  Social  Connections: Not on file  Intimate Partner Violence: Not on file   Social History   Tobacco Use  Smoking Status Former  Smokeless Tobacco Never   Social History   Substance and Sexual Activity  Alcohol Use Not Currently    Family History:  Family History  Problem Relation Age of Onset   Hypertension Mother    Hypertension Sister    Heart disease Maternal Grandfather     Past medical history, surgical history, medications, allergies, family history and social history reviewed with patient today and changes made to appropriate areas of the chart.   Review of Systems  Constitutional: Negative.   HENT: Negative.    Eyes: Negative.   Respiratory: Negative.    Cardiovascular: Negative.   Gastrointestinal: Negative.   Genitourinary: Negative.        Pelvic pain  Musculoskeletal:  Positive for joint pain and myalgias. Negative for back pain, falls and neck pain.  Skin: Negative.   Neurological: Negative.    Endo/Heme/Allergies: Negative.   Psychiatric/Behavioral: Negative.     All other ROS negative except what is listed above and in the HPI.      Objective:    BP (!) 91/51   Pulse (!) 54   Temp 97.9 F (36.6 C)   Wt 164 lb 6.4 oz (74.6 kg)   LMP 05/30/2022 (Exact Date)   SpO2 98%   Breastfeeding Yes   BMI 27.36 kg/m   Wt Readings from Last 3 Encounters:  05/30/22 164 lb 6.4 oz (74.6 kg)  03/07/22 170 lb (77.1 kg)  11/13/20 224 lb (101.6 kg)    Physical Exam Vitals and nursing note reviewed.  Constitutional:      General: She is not in acute distress.    Appearance: Normal appearance. She is normal weight. She is not ill-appearing, toxic-appearing or diaphoretic.  HENT:     Head: Normocephalic and atraumatic.     Right Ear: External ear normal.     Left Ear: External ear normal.     Nose: Nose normal.     Mouth/Throat:     Mouth: Mucous membranes are moist.     Pharynx: Oropharynx is clear.  Eyes:     General: No scleral icterus.       Right eye: No discharge.        Left eye: No discharge.     Extraocular Movements: Extraocular movements intact.     Conjunctiva/sclera: Conjunctivae normal.     Pupils: Pupils are equal, round, and reactive to light.  Cardiovascular:     Rate and Rhythm: Normal rate and regular rhythm.     Pulses: Normal pulses.     Heart sounds: Normal heart sounds. No murmur heard.    No friction rub. No gallop.  Pulmonary:     Effort: Pulmonary effort is normal. No respiratory distress.     Breath sounds: Normal breath sounds. No stridor. No wheezing, rhonchi or rales.  Chest:     Chest wall: No tenderness.  Musculoskeletal:        General: Normal range of motion.     Cervical back: Normal range of motion and neck supple.  Skin:    General: Skin is warm and dry.     Capillary Refill: Capillary refill takes less than 2 seconds.     Coloration: Skin is not jaundiced or pale.     Findings: No bruising, erythema, lesion or rash.   Neurological:     General: No focal deficit present.  Mental Status: She is alert and oriented to person, place, and time. Mental status is at baseline.  Psychiatric:        Mood and Affect: Mood normal.        Behavior: Behavior normal.        Thought Content: Thought content normal.        Judgment: Judgment normal.     Results for orders placed or performed during the hospital encounter of 11/13/20  CBC  Result Value Ref Range   WBC 8.5 4.0 - 10.5 K/uL   RBC 4.11 3.87 - 5.11 MIL/uL   Hemoglobin 9.9 (L) 12.0 - 15.0 g/dL   HCT 31.3 (L) 36.0 - 46.0 %   MCV 76.2 (L) 80.0 - 100.0 fL   MCH 24.1 (L) 26.0 - 34.0 pg   MCHC 31.6 30.0 - 36.0 g/dL   RDW 18.8 (H) 11.5 - 15.5 %   Platelets 225 150 - 400 K/uL   nRBC 0.0 0.0 - 0.2 %  RPR  Result Value Ref Range   RPR Ser Ql NON REACTIVE NON REACTIVE  Glucose, serum  Result Value Ref Range   Glucose, Bld 126 (H) 70 - 99 mg/dL  Glucose, capillary  Result Value Ref Range   Glucose-Capillary 78 70 - 99 mg/dL  Glucose, capillary  Result Value Ref Range   Glucose-Capillary 85 70 - 99 mg/dL  CBC  Result Value Ref Range   WBC 10.7 (H) 4.0 - 10.5 K/uL   RBC 4.00 3.87 - 5.11 MIL/uL   Hemoglobin 9.7 (L) 12.0 - 15.0 g/dL   HCT 30.5 (L) 36.0 - 46.0 %   MCV 76.3 (L) 80.0 - 100.0 fL   MCH 24.3 (L) 26.0 - 34.0 pg   MCHC 31.8 30.0 - 36.0 g/dL   RDW 18.7 (H) 11.5 - 15.5 %   Platelets 227 150 - 400 K/uL   nRBC 0.0 0.0 - 0.2 %  Comprehensive metabolic panel  Result Value Ref Range   Sodium 134 (L) 135 - 145 mmol/L   Potassium 3.7 3.5 - 5.1 mmol/L   Chloride 106 98 - 111 mmol/L   CO2 19 (L) 22 - 32 mmol/L   Glucose, Bld 79 70 - 99 mg/dL   BUN 5 (L) 6 - 20 mg/dL   Creatinine, Ser 0.44 0.44 - 1.00 mg/dL   Calcium 8.7 (L) 8.9 - 10.3 mg/dL   Total Protein 6.4 (L) 6.5 - 8.1 g/dL   Albumin 2.8 (L) 3.5 - 5.0 g/dL   AST 23 15 - 41 U/L   ALT 12 0 - 44 U/L   Alkaline Phosphatase 159 (H) 38 - 126 U/L   Total Bilirubin 0.4 0.3 - 1.2 mg/dL    GFR, Estimated >60 >60 mL/min   Anion gap 9 5 - 15  Protein / creatinine ratio, urine  Result Value Ref Range   Creatinine, Urine 49 mg/dL   Total Protein, Urine 9 mg/dL   Protein Creatinine Ratio 0.18 (H) 0.00 - 0.15 mg/mg[Cre]  Glucose, capillary  Result Value Ref Range   Glucose-Capillary 88 70 - 99 mg/dL  Glucose, capillary  Result Value Ref Range   Glucose-Capillary 84 70 - 99 mg/dL  Glucose, capillary  Result Value Ref Range   Glucose-Capillary 80 70 - 99 mg/dL  Glucose, capillary  Result Value Ref Range   Glucose-Capillary 88 70 - 99 mg/dL  Glucose, capillary  Result Value Ref Range   Glucose-Capillary 124 (H) 70 - 99 mg/dL   Comment 1 Notify  RN   CBC  Result Value Ref Range   WBC 15.2 (H) 4.0 - 10.5 K/uL   RBC 3.84 (L) 3.87 - 5.11 MIL/uL   Hemoglobin 9.4 (L) 12.0 - 15.0 g/dL   HCT 29.4 (L) 36.0 - 46.0 %   MCV 76.6 (L) 80.0 - 100.0 fL   MCH 24.5 (L) 26.0 - 34.0 pg   MCHC 32.0 30.0 - 36.0 g/dL   RDW 19.0 (H) 11.5 - 15.5 %   Platelets 202 150 - 400 K/uL   nRBC 0.0 0.0 - 0.2 %  Glucose, capillary  Result Value Ref Range   Glucose-Capillary 114 (H) 70 - 99 mg/dL  Glucose, capillary  Result Value Ref Range   Glucose-Capillary 109 (H) 70 - 99 mg/dL   Comment 1 Notify RN   Type and screen [place]  Result Value Ref Range   ABO/RH(D) O POS    Antibody Screen NEG    Sample Expiration      11/16/2020,2359 Performed at [facility], [address] [place], [place] [address]       Assessment & Plan:   Problem List Items Addressed This Visit       Other   Left arm pain    X-ray in July showed flattened cervical lordosis. Likely secondary to muscle spasms. Will treat with flexeril and stretches and recheck in about a month. Patient is about to lose her medicaid and would like to see sports med as she is trying this- referral generated today. Call with any concerns. Recheck 3 weeks.       Relevant Orders   Ambulatory referral  to Sports Medicine   Other Visit Diagnoses     Routine general medical examination at a health care facility    -  Primary   Vaccines up to date/declined. Screening labs checked today. Pap up to date. Continue diet and exercise. Call with any concerns.    Relevant Orders   CBC with Differential/Platelet   Comprehensive metabolic panel   Lipid Panel w/o Chol/HDL Ratio   Urinalysis, Routine w reflex microscopic   TSH   VITAMIN D 25 Hydroxy (Vit-D Deficiency, Fractures)   History of gestational diabetes       A1c checked today. Await results.    Relevant Orders   Bayer DCA Hb A1c Waived   Pelvic pain       Will get pelvic US to look for ?causes. Await results.    Relevant Orders   US Pelvis Limited        Follow up plan: Return in about 3 weeks (around 06/20/2022).   LABORATORY TESTING:  - Pap smear: up to date  IMMUNIZATIONS:   - Tdap: Tetanus vaccination status reviewed: last tetanus booster within 10 years. - Influenza: Refused - Pneumovax: Not applicable - Prevnar: Not applicable - COVID: Refused - HPV: Refused - Shingrix vaccine: Not applicable   PATIENT COUNSELING:   Advised to take 1 mg of folate supplement per day if capable of pregnancy.   Sexuality: Discussed sexually transmitted diseases, partner selection, use of condoms, avoidance of unintended pregnancy  and contraceptive alternatives.   Advised to avoid cigarette smoking.  I discussed with the patient that most people either abstain from alcohol or drink within safe limits (<=14/week and <=4 drinks/occasion for males, <=7/weeks and <= 3 drinks/occasion for females) and that the risk for alcohol disorders and other health effects rises proportionally with the number of drinks per week and how often a drinker  exceeds daily limits.  Discussed cessation/primary prevention of drug use and availability of treatment for abuse.   Diet: Encouraged to adjust caloric intake to maintain  or achieve ideal body  weight, to reduce intake of dietary saturated fat and total fat, to limit sodium intake by avoiding high sodium foods and not adding table salt, and to maintain adequate dietary potassium and calcium preferably from fresh fruits, vegetables, and low-fat dairy products.    stressed the importance of regular exercise  Injury prevention: Discussed safety belts, safety helmets, smoke detector, smoking near bedding or upholstery.   Dental health: Discussed importance of regular tooth brushing, flossing, and dental visits.    NEXT PREVENTATIVE PHYSICAL DUE IN 1 YEAR. Return in about 3 weeks (around 06/20/2022).

## 2022-05-31 ENCOUNTER — Other Ambulatory Visit: Payer: Self-pay | Admitting: Family Medicine

## 2022-05-31 LAB — LIPID PANEL W/O CHOL/HDL RATIO
Cholesterol, Total: 138 mg/dL (ref 100–199)
HDL: 42 mg/dL (ref >39)
LDL Chol Calc (NIH): 84 mg/dL (ref 0–99)
Triglycerides: 55 mg/dL (ref 0–149)
VLDL Cholesterol Cal: 12 mg/dL (ref 5–40)

## 2022-05-31 LAB — CBC WITH DIFFERENTIAL/PLATELET
Basophils Absolute: 0.1 10*3/uL (ref 0.0–0.2)
Basos: 1 %
EOS (ABSOLUTE): 0.1 10*3/uL (ref 0.0–0.4)
Eos: 2 %
Hematocrit: 33.4 % — ABNORMAL LOW (ref 34.0–46.6)
Hemoglobin: 10.3 g/dL — ABNORMAL LOW (ref 11.1–15.9)
Immature Grans (Abs): 0 10*3/uL (ref 0.0–0.1)
Immature Granulocytes: 0 %
Lymphocytes Absolute: 2.8 10*3/uL (ref 0.7–3.1)
Lymphs: 32 %
MCH: 23.6 pg — ABNORMAL LOW (ref 26.6–33.0)
MCHC: 30.8 g/dL — ABNORMAL LOW (ref 31.5–35.7)
MCV: 77 fL — ABNORMAL LOW (ref 79–97)
Monocytes Absolute: 0.5 10*3/uL (ref 0.1–0.9)
Monocytes: 6 %
Neutrophils Absolute: 5.3 10*3/uL (ref 1.4–7.0)
Neutrophils: 59 %
Platelets: 381 10*3/uL (ref 150–450)
RBC: 4.36 x10E6/uL (ref 3.77–5.28)
RDW: 15.1 % (ref 11.7–15.4)
WBC: 8.8 10*3/uL (ref 3.4–10.8)

## 2022-05-31 LAB — TSH: TSH: 0.41 u[IU]/mL — ABNORMAL LOW (ref 0.450–4.500)

## 2022-05-31 LAB — URINALYSIS, ROUTINE W REFLEX MICROSCOPIC
Bilirubin, UA: NEGATIVE
Glucose, UA: NEGATIVE
Ketones, UA: NEGATIVE
Leukocytes,UA: NEGATIVE
Nitrite, UA: NEGATIVE
Protein,UA: NEGATIVE
Specific Gravity, UA: 1.025 (ref 1.005–1.030)
Urobilinogen, Ur: 0.2 mg/dL (ref 0.2–1.0)
pH, UA: 7 (ref 5.0–7.5)

## 2022-05-31 LAB — COMPREHENSIVE METABOLIC PANEL
ALT: 10 IU/L (ref 0–32)
AST: 16 IU/L (ref 0–40)
Albumin/Globulin Ratio: 1.7 (ref 1.2–2.2)
Albumin: 4.4 g/dL (ref 3.9–4.9)
Alkaline Phosphatase: 82 IU/L (ref 44–121)
BUN/Creatinine Ratio: 19 (ref 9–23)
BUN: 12 mg/dL (ref 6–20)
Bilirubin Total: 0.3 mg/dL (ref 0.0–1.2)
CO2: 19 mmol/L — ABNORMAL LOW (ref 20–29)
Calcium: 9.4 mg/dL (ref 8.7–10.2)
Chloride: 105 mmol/L (ref 96–106)
Creatinine, Ser: 0.62 mg/dL (ref 0.57–1.00)
Globulin, Total: 2.6 g/dL (ref 1.5–4.5)
Glucose: 81 mg/dL (ref 70–99)
Potassium: 4.2 mmol/L (ref 3.5–5.2)
Sodium: 140 mmol/L (ref 134–144)
Total Protein: 7 g/dL (ref 6.0–8.5)
eGFR: 120 mL/min/{1.73_m2} (ref >59)

## 2022-05-31 LAB — MICROSCOPIC EXAMINATION
Bacteria, UA: NONE SEEN
WBC, UA: NONE SEEN /hpf (ref 0–5)

## 2022-05-31 LAB — BAYER DCA HB A1C WAIVED: HB A1C (BAYER DCA - WAIVED): 5.2 % (ref 4.8–5.6)

## 2022-05-31 LAB — VITAMIN D 25 HYDROXY (VIT D DEFICIENCY, FRACTURES): Vit D, 25-Hydroxy: 18.3 ng/mL — ABNORMAL LOW (ref 30.0–100.0)

## 2022-05-31 MED ORDER — VITAMIN D (ERGOCALCIFEROL) 1.25 MG (50000 UNIT) PO CAPS: 50000.0000 [IU] | ORAL_CAPSULE | ORAL | 1 refills | Status: DC

## 2022-05-31 MED ORDER — IRON (FERROUS SULFATE) 325 (65 FE) MG PO TABS: 325.0000 mg | ORAL_TABLET | Freq: Every day | ORAL | 2 refills | Status: DC

## 2022-06-01 ENCOUNTER — Ambulatory Visit
Admission: RE | Admit: 2022-06-01 | Discharge: 2022-06-01 | Disposition: A | Discharge status: Home / Self Care | Payer: Medicaid Other | Source: Ambulatory Visit | Attending: Family Medicine | Admitting: Family Medicine

## 2022-06-01 DIAGNOSIS — R102 Pelvic and perineal pain: Secondary | ICD-10-CM | POA: Diagnosis not present

## 2022-06-01 DIAGNOSIS — D259 Leiomyoma of uterus, unspecified: Secondary | ICD-10-CM | POA: Diagnosis not present

## 2022-06-02 ENCOUNTER — Encounter: Payer: Medicaid Other | Admitting: Family Medicine

## 2022-06-07 ENCOUNTER — Encounter: Payer: Self-pay | Admitting: Family Medicine

## 2022-06-07 ENCOUNTER — Ambulatory Visit: Payer: Medicaid Other | Admitting: Family Medicine

## 2022-06-07 VITALS — BP 118/78 | HR 48 | Ht 65.0 in | Wt 164.0 lb

## 2022-06-07 DIAGNOSIS — M62838 Other muscle spasm: Secondary | ICD-10-CM | POA: Diagnosis not present

## 2022-06-07 DIAGNOSIS — M7542 Impingement syndrome of left shoulder: Secondary | ICD-10-CM | POA: Diagnosis not present

## 2022-06-07 DIAGNOSIS — M7522 Bicipital tendinitis, left shoulder: Secondary | ICD-10-CM | POA: Diagnosis not present

## 2022-06-07 MED ORDER — PREDNISONE 50 MG PO TABS: 50.0000 mg | ORAL_TABLET | Freq: Every day | ORAL | 0 refills | Status: DC
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
## 2022-06-07 NOTE — Patient Instructions (Addendum)
-   Hold off from upper body workouts until follow-up - Start home exercises - After breastfeeding complete start the following: - prednisone x 5 days - cyclobenzaprine nightly - start ibuprofen as-needed after prednisone

## 2022-06-07 NOTE — Assessment & Plan Note (Signed)
See additional assessment(s) for plan details. 
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
## 2022-06-20 ENCOUNTER — Ambulatory Visit: Payer: Medicaid Other | Admitting: Family Medicine

## 2022-06-20 ENCOUNTER — Encounter: Payer: Self-pay | Admitting: Family Medicine

## 2022-06-20 VITALS — BP 101/63 | HR 53 | Temp 98.3°F | Ht 65.0 in | Wt 162.6 lb

## 2022-06-20 DIAGNOSIS — D219 Benign neoplasm of connective and other soft tissue, unspecified: Secondary | ICD-10-CM | POA: Diagnosis not present

## 2022-06-20 DIAGNOSIS — M7542 Impingement syndrome of left shoulder: Secondary | ICD-10-CM

## 2022-06-20 DIAGNOSIS — R7989 Other specified abnormal findings of blood chemistry: Secondary | ICD-10-CM

## 2022-06-20 DIAGNOSIS — D649 Anemia, unspecified: Secondary | ICD-10-CM | POA: Diagnosis not present

## 2022-06-20 NOTE — Assessment & Plan Note (Signed)
Continue to follow with sports medicine. Referral to physical therapy placed today. Call with any concerns.

## 2022-06-20 NOTE — Progress Notes (Signed)
BP 101/63   Pulse (!) 53   Temp 98.3 F (36.8 C) (Oral)   Ht _0  (1.651 m)   Wt 162 lb 9.6 oz (73.8 kg)   LMP 05/30/2022 (Exact Date)   SpO2 99%   BMI 27.06 kg/m    Subjective:    Patient ID: Caitlin Ortega, female    DOB: Dec 14, 1985, 36 y.o.   MRN: [id number]  HPI: Caitlin Ortega is a 36 y.o. female  Chief Complaint  Patient presents with   Arm Pain    Patient is here to follow up on Arm Pain.    ARM PAIN- has not been able to take the steroids as she's still breastfeeding  Duration: 7+ months Location: whole arm Mechanism of injury: unknown Onset: gradual Severity: moderate  Quality:  sharp Frequency: intermittent Radiation: yes Aggravating factors: exercise  Alleviating factors: aleve, icing  Status: worse Treatments attempted: significant  Relief with NSAIDs?:  significant Swelling: no Redness: no  Warmth: no Trauma: no Chest pain: no  Shortness of breath: no  Fever: no Decreased sensation: no Paresthesias: no Weakness: no  Relevant past medical, surgical, family and social history reviewed and updated as indicated. Interim medical history since our last visit reviewed. Allergies and medications reviewed and updated.  Review of Systems  Constitutional: Negative.   Respiratory: Negative.    Cardiovascular: Negative.   Gastrointestinal: Negative.   Musculoskeletal:  Positive for myalgias. Negative for arthralgias, back pain, gait problem, joint swelling, neck pain and neck stiffness.  Skin: Negative.   Neurological: Negative.   Psychiatric/Behavioral: Negative.      Per HPI unless specifically indicated above     Objective:    BP 101/63   Pulse (!) 53   Temp 98.3 F (36.8 C) (Oral)   Ht _1  (1.651 m)   Wt 162 lb 9.6 oz (73.8 kg)   LMP 05/30/2022 (Exact Date)   SpO2 99%   BMI 27.06 kg/m   Wt Readings from Last 3 Encounters:  06/20/22 162 lb 9.6 oz (73.8 kg)  06/07/22 164 lb (74.4 kg)  05/30/22 164 lb 6.4 oz (74.6 kg)     Physical Exam Vitals and nursing note reviewed.  Constitutional:      General: She is not in acute distress.    Appearance: Normal appearance. She is normal weight. She is not ill-appearing, toxic-appearing or diaphoretic.  HENT:     Head: Normocephalic and atraumatic.     Right Ear: External ear normal.     Left Ear: External ear normal.     Nose: Nose normal.     Mouth/Throat:     Mouth: Mucous membranes are moist.     Pharynx: Oropharynx is clear.  Eyes:     General: No scleral icterus.       Right eye: No discharge.        Left eye: No discharge.     Extraocular Movements: Extraocular movements intact.     Conjunctiva/sclera: Conjunctivae normal.     Pupils: Pupils are equal, round, and reactive to light.  Cardiovascular:     Rate and Rhythm: Normal rate and regular rhythm.     Pulses: Normal pulses.     Heart sounds: Normal heart sounds. No murmur heard.    No friction rub. No gallop.  Pulmonary:     Effort: Pulmonary effort is normal. No respiratory distress.     Breath sounds: Normal breath sounds. No stridor. No wheezing, rhonchi or rales.  Chest:  Chest wall: No tenderness.  Musculoskeletal:        General: Normal range of motion.     Cervical back: Normal range of motion and neck supple.  Skin:    General: Skin is warm and dry.     Capillary Refill: Capillary refill takes less than 2 seconds.     Coloration: Skin is not jaundiced or pale.     Findings: No bruising, erythema, lesion or rash.  Neurological:     General: No focal deficit present.     Mental Status: She is alert and oriented to person, place, and time. Mental status is at baseline.  Psychiatric:        Mood and Affect: Mood normal.        Behavior: Behavior normal.        Thought Content: Thought content normal.        Judgment: Judgment normal.     Results for orders placed or performed in visit on 05/30/22  Microscopic Examination   Urine  Result Value Ref Range   WBC, UA None seen 0  - 5 /hpf   RBC, Urine 0-2 0 - 2 /hpf   Epithelial Cells (non renal) 0-10 0 - 10 /hpf   Bacteria, UA None seen None seen/Few  CBC with Differential/Platelet  Result Value Ref Range   WBC 8.8 3.4 - 10.8 x10E3/uL   RBC 4.36 3.77 - 5.28 x10E6/uL   Hemoglobin 10.3 (L) 11.1 - 15.9 g/dL   Hematocrit 33.4 (L) 34.0 - 46.6 %   MCV 77 (L) 79 - 97 fL   MCH 23.6 (L) 26.6 - 33.0 pg   MCHC 30.8 (L) 31.5 - 35.7 g/dL   RDW 15.1 11.7 - 15.4 %   Platelets 381 150 - 450 x10E3/uL   Neutrophils 59 Not Estab. %   Lymphs 32 Not Estab. %   Monocytes 6 Not Estab. %   Eos 2 Not Estab. %   Basos 1 Not Estab. %   Neutrophils Absolute 5.3 1.4 - 7.0 x10E3/uL   Lymphocytes Absolute 2.8 0.7 - 3.1 x10E3/uL   Monocytes Absolute 0.5 0.1 - 0.9 x10E3/uL   EOS (ABSOLUTE) 0.1 0.0 - 0.4 x10E3/uL   Basophils Absolute 0.1 0.0 - 0.2 x10E3/uL   Immature Granulocytes 0 Not Estab. %   Immature Grans (Abs) 0.0 0.0 - 0.1 x10E3/uL  Comprehensive metabolic panel  Result Value Ref Range   Glucose 81 70 - 99 mg/dL   BUN 12 6 - 20 mg/dL   Creatinine, Ser 0.62 0.57 - 1.00 mg/dL   eGFR 120 >59 mL/min/1.73   BUN/Creatinine Ratio 19 9 - 23   Sodium 140 134 - 144 mmol/L   Potassium 4.2 3.5 - 5.2 mmol/L   Chloride 105 96 - 106 mmol/L   CO2 19 (L) 20 - 29 mmol/L   Calcium 9.4 8.7 - 10.2 mg/dL   Total Protein 7.0 6.0 - 8.5 g/dL   Albumin 4.4 3.9 - 4.9 g/dL   Globulin, Total 2.6 1.5 - 4.5 g/dL   Albumin/Globulin Ratio 1.7 1.2 - 2.2   Bilirubin Total 0.3 0.0 - 1.2 mg/dL   Alkaline Phosphatase 82 44 - 121 IU/L   AST 16 0 - 40 IU/L   ALT 10 0 - 32 IU/L  Lipid Panel w/o Chol/HDL Ratio  Result Value Ref Range   Cholesterol, Total 138 100 - 199 mg/dL   Triglycerides 55 0 - 149 mg/dL   HDL 42 >39 mg/dL   VLDL Cholesterol Cal 12  5 - 40 mg/dL   LDL Chol Calc (NIH) 84 0 - 99 mg/dL  Urinalysis, Routine w reflex microscopic  Result Value Ref Range   Specific Gravity, UA 1.025 1.005 - 1.030   pH, UA 7.0 5.0 - 7.5   Color, UA Yellow  Yellow   Appearance Ur Clear Clear   Leukocytes,UA Negative Negative   Protein,UA Negative Negative/Trace   Glucose, UA Negative Negative   Ketones, UA Negative Negative   RBC, UA 2+ (A) Negative   Bilirubin, UA Negative Negative   Urobilinogen, Ur 0.2 0.2 - 1.0 mg/dL   Nitrite, UA Negative Negative   Microscopic Examination See below:   TSH  Result Value Ref Range   TSH 0.410 (L) 0.450 - 4.500 uIU/mL  VITAMIN D 25 Hydroxy (Vit-D Deficiency, Fractures)  Result Value Ref Range   Vit D, 25-Hydroxy 18.3 (L) 30.0 - 100.0 ng/mL  Bayer DCA Hb A1c Waived  Result Value Ref Range   HB A1C (BAYER DCA - WAIVED) 5.2 4.8 - 5.6 %      Assessment & Plan:   Problem List Items Addressed This Visit       Musculoskeletal and Integument   Rotator cuff impingement syndrome, left - Primary    Continue to follow with sports medicine. Referral to physical therapy placed today. Call with any concerns.       Relevant Orders   Ambulatory referral to Physical Therapy     Other   Fibroid    Wants to hold on seeing GYN at this time. Knows to go if she needs to.      Other Visit Diagnoses     Abnormal thyroid blood test       Rechecking labs today. Await results. Treat as needed.   Relevant Orders   Thyroid Panel With TSH   Anemia, unspecified type       Rechecking labs today. Await results. Treat as needed.   Relevant Orders   CBC with Differential/Platelet        Follow up plan: Return in about 11 months (around 05/21/2023) for physical.

## 2022-06-20 NOTE — Assessment & Plan Note (Signed)
Wants to hold on seeing GYN at this time. Knows to go if she needs to.

## 2022-06-21 LAB — CBC WITH DIFFERENTIAL/PLATELET
Basophils Absolute: 0.1 10*3/uL (ref 0.0–0.2)
Basos: 1 %
EOS (ABSOLUTE): 0.1 10*3/uL (ref 0.0–0.4)
Eos: 2 %
Hematocrit: 39.8 % (ref 34.0–46.6)
Hemoglobin: 12.4 g/dL (ref 11.1–15.9)
Immature Grans (Abs): 0 10*3/uL (ref 0.0–0.1)
Immature Granulocytes: 0 %
Lymphocytes Absolute: 2.1 10*3/uL (ref 0.7–3.1)
Lymphs: 31 %
MCH: 24.2 pg — ABNORMAL LOW (ref 26.6–33.0)
MCHC: 31.2 g/dL — ABNORMAL LOW (ref 31.5–35.7)
MCV: 78 fL — ABNORMAL LOW (ref 79–97)
Monocytes Absolute: 0.3 10*3/uL (ref 0.1–0.9)
Monocytes: 5 %
Neutrophils Absolute: 4.1 10*3/uL (ref 1.4–7.0)
Neutrophils: 61 %
Platelets: 364 10*3/uL (ref 150–450)
RBC: 5.13 x10E6/uL (ref 3.77–5.28)
RDW: 15.3 % (ref 11.7–15.4)
WBC: 6.8 10*3/uL (ref 3.4–10.8)

## 2022-06-21 LAB — THYROID PANEL WITH TSH
Free Thyroxine Index: 2.5 (ref 1.2–4.9)
T3 Uptake Ratio: 27 % (ref 24–39)
T4, Total: 9.2 ug/dL (ref 4.5–12.0)
TSH: 0.343 u[IU]/mL — ABNORMAL LOW (ref 0.450–4.500)

## 2022-06-24 ENCOUNTER — Encounter: Payer: Self-pay | Admitting: Family Medicine

## 2022-06-24 DIAGNOSIS — E559 Vitamin D deficiency, unspecified: Secondary | ICD-10-CM | POA: Insufficient documentation

## 2022-06-26 ENCOUNTER — Other Ambulatory Visit: Payer: Self-pay | Admitting: Family Medicine

## 2022-06-26 DIAGNOSIS — R7989 Other specified abnormal findings of blood chemistry: Secondary | ICD-10-CM

## 2022-07-04 ENCOUNTER — Emergency Department
Admission: EM | Admit: 2022-07-04 | Discharge: 2022-07-04 | Discharge status: Against Medical Advice | Payer: Medicaid Other

## 2022-07-04 DIAGNOSIS — Z793 Long term (current) use of hormonal contraceptives: Secondary | ICD-10-CM | POA: Diagnosis not present

## 2022-07-04 DIAGNOSIS — R457 State of emotional shock and stress, unspecified: Secondary | ICD-10-CM | POA: Diagnosis not present

## 2022-07-04 DIAGNOSIS — F17210 Nicotine dependence, cigarettes, uncomplicated: Secondary | ICD-10-CM | POA: Diagnosis not present

## 2022-07-04 DIAGNOSIS — F419 Anxiety disorder, unspecified: Secondary | ICD-10-CM | POA: Diagnosis not present

## 2022-07-04 DIAGNOSIS — M25512 Pain in left shoulder: Secondary | ICD-10-CM | POA: Diagnosis not present

## 2022-07-04 DIAGNOSIS — G8929 Other chronic pain: Secondary | ICD-10-CM | POA: Diagnosis not present

## 2022-07-04 NOTE — ED Notes (Signed)
No answer when called several times from lobby 
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
## 2022-07-07 ENCOUNTER — Ambulatory Visit: Payer: Medicaid Other | Admitting: Family Medicine

## 2022-07-08 ENCOUNTER — Telehealth: Payer: Medicaid Other | Admitting: Family Medicine

## 2022-07-19 ENCOUNTER — Telehealth (INDEPENDENT_AMBULATORY_CARE_PROVIDER_SITE_OTHER): Payer: Medicaid Other | Admitting: Family Medicine

## 2022-07-19 ENCOUNTER — Encounter: Payer: Self-pay | Admitting: Family Medicine

## 2022-07-19 DIAGNOSIS — F419 Anxiety disorder, unspecified: Secondary | ICD-10-CM | POA: Diagnosis not present

## 2022-07-19 MED ORDER — HYDROXYZINE HCL 25 MG PO TABS: 25.0000 mg | ORAL_TABLET | Freq: Three times a day (TID) | ORAL | 3 refills | Status: AC | PRN

## 2022-07-19 NOTE — Assessment & Plan Note (Signed)
Started on hydroxyzine by ER. Has been doing very well on it. Not interested in preventative medicine at this time. Will continue current regimen. Call with any concerns. Recheck at physical. Refills given. Call with any concerns.

## 2022-07-19 NOTE — Progress Notes (Signed)
There were no vitals taken for this visit.   Subjective:    Patient ID: Caitlin Ortega, female    DOB: 23-Jan-1986, 36 y.o.   MRN: [id number]  HPI: Caitlin Ortega is a 36 y.o. female  Chief Complaint  Patient presents with   Anxiety   ANXIETY/STRESS Duration: chronic Status:better Anxious mood: yes  Excessive worrying: yes Irritability: no  Sweating: no Nausea: no Palpitations:no Hyperventilation: no Panic attacks: no Agoraphobia: no  Obscessions/compulsions: no Depressed mood: no    07/19/2022    3:59 PM 05/30/2022    3:27 PM 08/06/2020    1:43 PM 02/03/2020   10:35 AM 01/28/2019    3:23 PM  Depression screen PHQ 2/9  Decreased Interest 0 0 0 0 0  Down, Depressed, Hopeless 0 0 0 0 0  PHQ - 2 Score 0 0 0 0 0  Altered sleeping 2 0   3  Tired, decreased energy '2 2   1  '$ Change in appetite 0 1   0  Feeling bad or failure about yourself  0 0   0  Trouble concentrating 0 3   0  Moving slowly or fidgety/restless 0 3   0  Suicidal thoughts 0 0   0  PHQ-9 Score '4 9   4  '$ Difficult doing work/chores Somewhat difficult Somewhat difficult   Not difficult at all      07/19/2022    4:01 PM 05/30/2022    3:27 PM 05/14/2018    2:35 PM  GAD 7 : Generalized Anxiety Score  Nervous, Anxious, on Edge '1 3 2  '$ Control/stop worrying 0 3 2  Worry too much - different things '1 3 3  '$ Trouble relaxing '1 2 3  '$ Restless 1 2 0  Easily annoyed or irritable 0 2 1  Afraid - awful might happen 0 2 2  Total GAD 7 Score '4 17 13  '$ Anxiety Difficulty Not difficult at all Somewhat difficult Somewhat difficult   Anhedonia: no Weight changes: no Insomnia: no   Hypersomnia: no Fatigue/loss of energy: yes Feelings of worthlessness: no Feelings of guilt: no Impaired concentration/indecisiveness: no Suicidal ideations: no  Crying spells: no Recent Stressors/Life Changes: no   Relationship problems: no   Family stress: no     Financial stress: no    Job stress: no    Recent death/loss:  no  Relevant past medical, surgical, family and social history reviewed and updated as indicated. Interim medical history since our last visit reviewed. Allergies and medications reviewed and updated.  Review of Systems  Constitutional: Negative.   Respiratory: Negative.    Cardiovascular: Negative.   Gastrointestinal: Negative.   Musculoskeletal: Negative.   Psychiatric/Behavioral: Negative.      Per HPI unless specifically indicated above     Objective:    There were no vitals taken for this visit.  Wt Readings from Last 3 Encounters:  06/20/22 162 lb 9.6 oz (73.8 kg)  06/07/22 164 lb (74.4 kg)  05/30/22 164 lb 6.4 oz (74.6 kg)    Physical Exam Vitals and nursing note reviewed.  Constitutional:      General: She is not in acute distress.    Appearance: Normal appearance. She is normal weight. She is not ill-appearing, toxic-appearing or diaphoretic.  HENT:     Head: Normocephalic and atraumatic.     Right Ear: External ear normal.     Left Ear: External ear normal.     Nose: Nose normal.     Mouth/Throat:  Mouth: Mucous membranes are moist.     Pharynx: Oropharynx is clear.  Eyes:     General: No scleral icterus.       Right eye: No discharge.        Left eye: No discharge.     Conjunctiva/sclera: Conjunctivae normal.     Pupils: Pupils are equal, round, and reactive to light.  Pulmonary:     Effort: Pulmonary effort is normal. No respiratory distress.     Comments: Speaking in full sentences Musculoskeletal:        General: Normal range of motion.     Cervical back: Normal range of motion.  Skin:    Coloration: Skin is not jaundiced or pale.     Findings: No bruising, erythema, lesion or rash.  Neurological:     Mental Status: She is alert and oriented to person, place, and time. Mental status is at baseline.  Psychiatric:        Mood and Affect: Mood normal.        Behavior: Behavior normal.        Thought Content: Thought content normal.         Judgment: Judgment normal.     Results for orders placed or performed in visit on 06/20/22  CBC with Differential/Platelet  Result Value Ref Range   WBC 6.8 3.4 - 10.8 x10E3/uL   RBC 5.13 3.77 - 5.28 x10E6/uL   Hemoglobin 12.4 11.1 - 15.9 g/dL   Hematocrit 39.8 34.0 - 46.6 %   MCV 78 (L) 79 - 97 fL   MCH 24.2 (L) 26.6 - 33.0 pg   MCHC 31.2 (L) 31.5 - 35.7 g/dL   RDW 15.3 11.7 - 15.4 %   Platelets 364 150 - 450 x10E3/uL   Neutrophils 61 Not Estab. %   Lymphs 31 Not Estab. %   Monocytes 5 Not Estab. %   Eos 2 Not Estab. %   Basos 1 Not Estab. %   Neutrophils Absolute 4.1 1.4 - 7.0 x10E3/uL   Lymphocytes Absolute 2.1 0.7 - 3.1 x10E3/uL   Monocytes Absolute 0.3 0.1 - 0.9 x10E3/uL   EOS (ABSOLUTE) 0.1 0.0 - 0.4 x10E3/uL   Basophils Absolute 0.1 0.0 - 0.2 x10E3/uL   Immature Granulocytes 0 Not Estab. %   Immature Grans (Abs) 0.0 0.0 - 0.1 x10E3/uL  Thyroid Panel With TSH  Result Value Ref Range   TSH 0.343 (L) 0.450 - 4.500 uIU/mL   T4, Total 9.2 4.5 - 12.0 ug/dL   T3 Uptake Ratio 27 24 - 39 %   Free Thyroxine Index 2.5 1.2 - 4.9      Assessment & Plan:   Problem List Items Addressed This Visit       Other   Anxiety - Primary    Started on hydroxyzine by ER. Has been doing very well on it. Not interested in preventative medicine at this time. Will continue current regimen. Call with any concerns. Recheck at physical. Refills given. Call with any concerns.       Relevant Medications   hydrOXYzine (ATARAX) 25 MG tablet     Follow up plan: Return as scheduled.   This visit was completed via video visit through MyChart due to the restrictions of the COVID-19 pandemic. All issues as above were discussed and addressed. Physical exam was done as above through visual confirmation on video through MyChart. If it was felt that the patient should be evaluated in the office, they were directed there. The patient verbally consented  to this visit. Location of the patient: parking  lot Location of the provider: work Those involved with this call:  Provider: Park Liter, DO CMA: Irena Reichmann, [place] Desk/Registration: [employer]  Time spent on call:  15 minutes with patient face to face via video conference. More than 50% of this time was spent in counseling and coordination of care. 23 minutes total spent in review of patient's record and preparation of their chart.

## 2022-08-03 IMAGING — CT CT ANGIO CHEST
2 of 6 series · 19 of 46 positions shown · IV contrast (APPLIED)
Comparison: Single-view of the chest today.

CLINICAL DATA: Increasing shortness of breath. Pregnant patient.
The patient was diagnosed with [id number] in August 2020.

EXAM:
CT ANGIOGRAPHY CHEST WITH CONTRAST
TECHNIQUE: Multidetector CT imaging of the chest was performed using the
standard protocol during bolus administration of intravenous
contrast. Multiplanar CT image reconstructions and MIPs were
obtained to evaluate the vascular anatomy.
CONTRAST:  75 mL OMNIPAQUE IOHEXOL 350 MG/ML SOLN

[Series 5: thins · axial · 0.65mm/px · z∈[-250,-46]mm · 17 of 225 slices shown]
[im 10/225  lung]
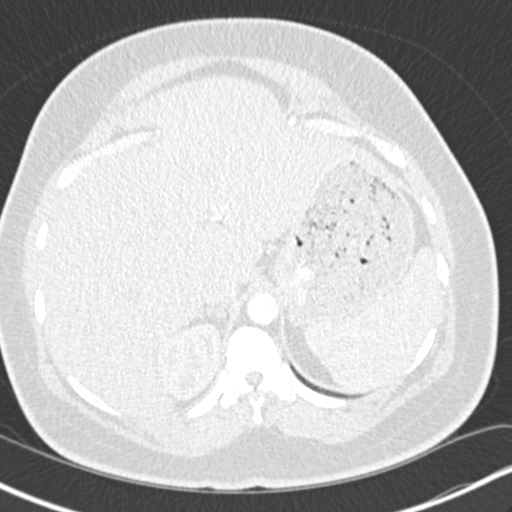
[im 20/225  soft-tissue]
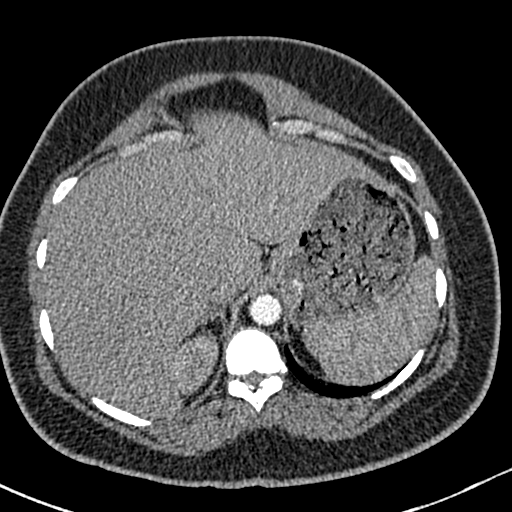
[im 39/225  lung]
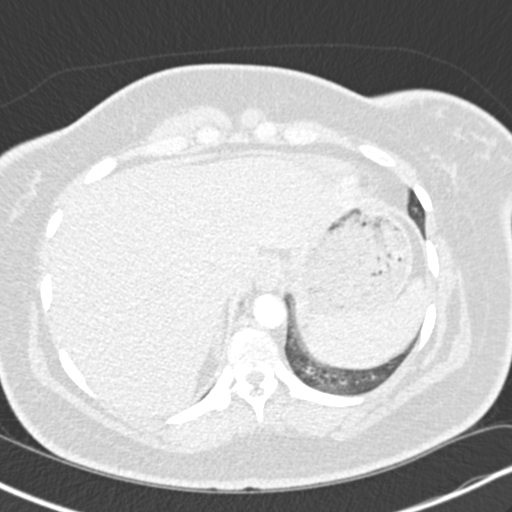
[im 49/225  soft-tissue]
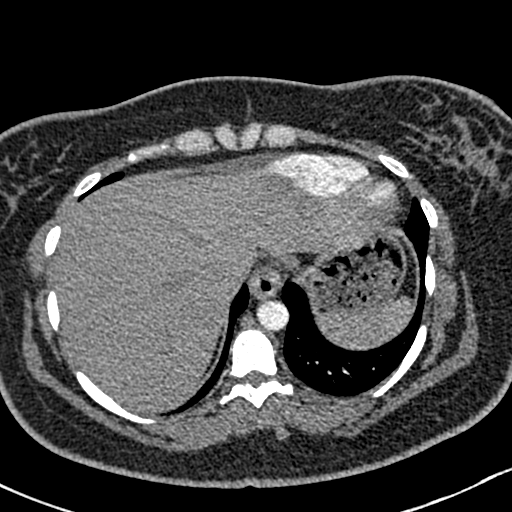
[im 59/225  lung]
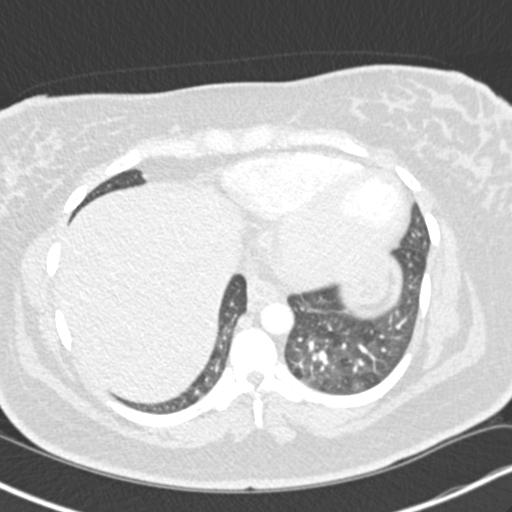
[im 78/225  soft-tissue]
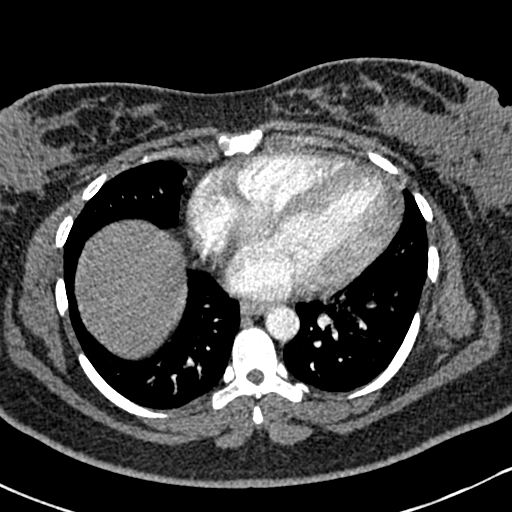
[im 88/225  lung]
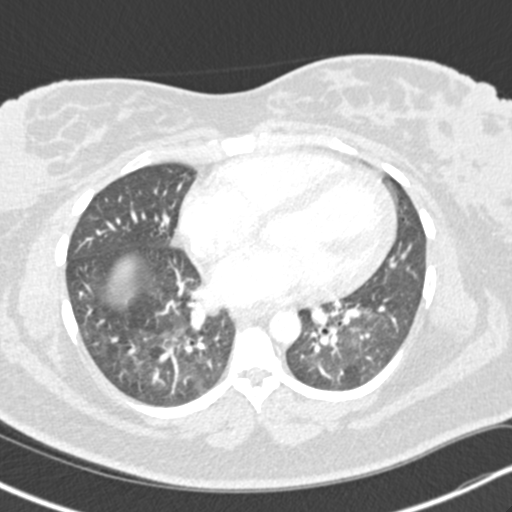
[im 98/225  soft-tissue]
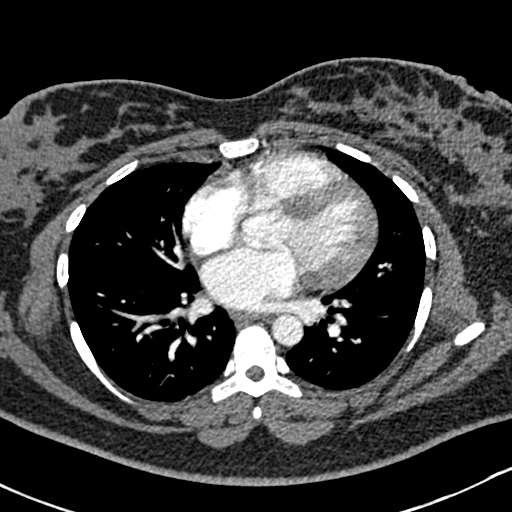
[im 117/225  lung]
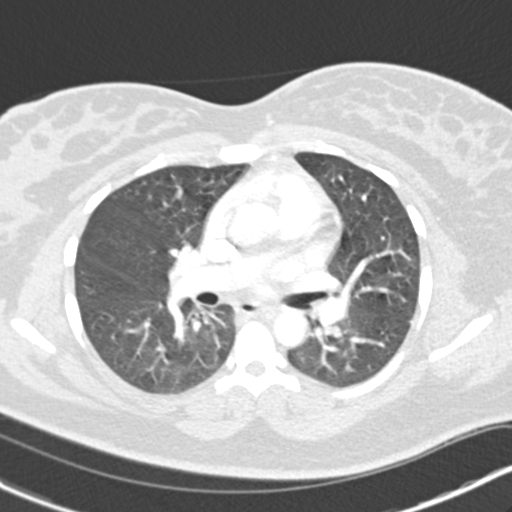
[im 127/225  soft-tissue]
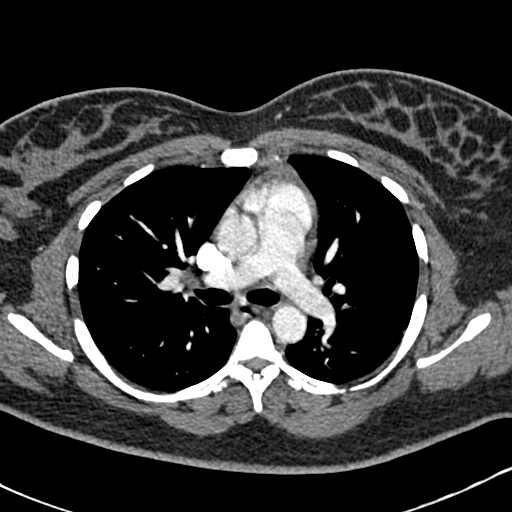
[im 137/225  lung]
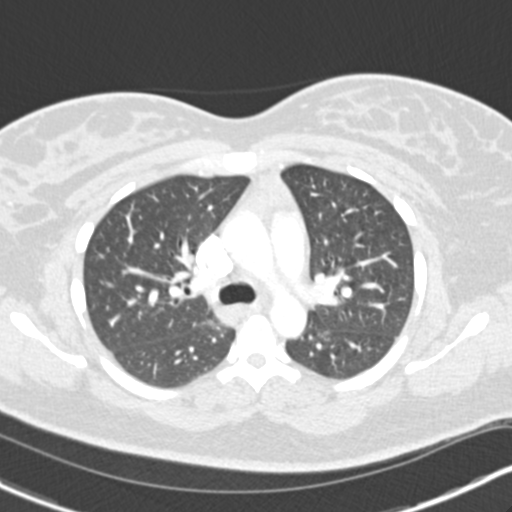
[im 147/225  soft-tissue]
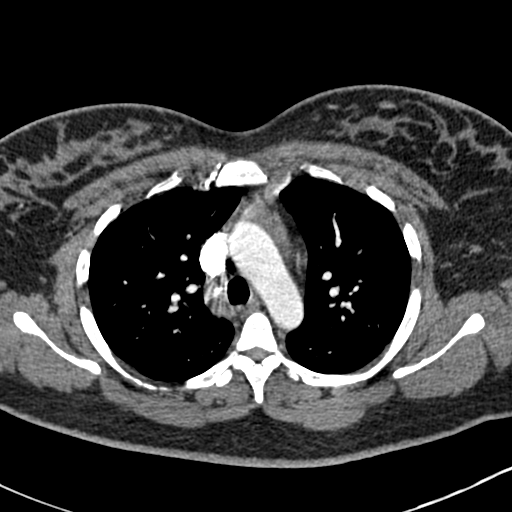
[im 166/225  lung]
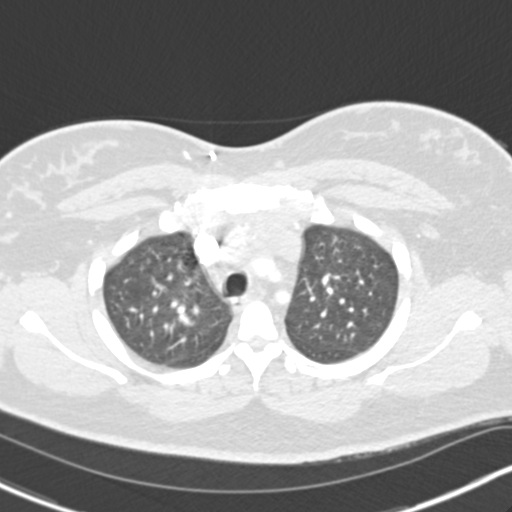
[im 176/225  soft-tissue]
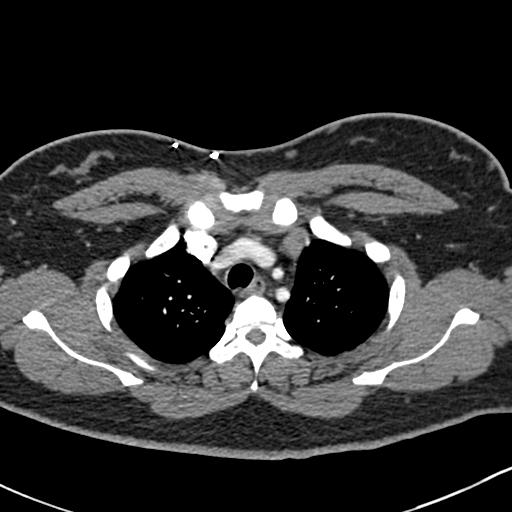
[im 186/225  lung]
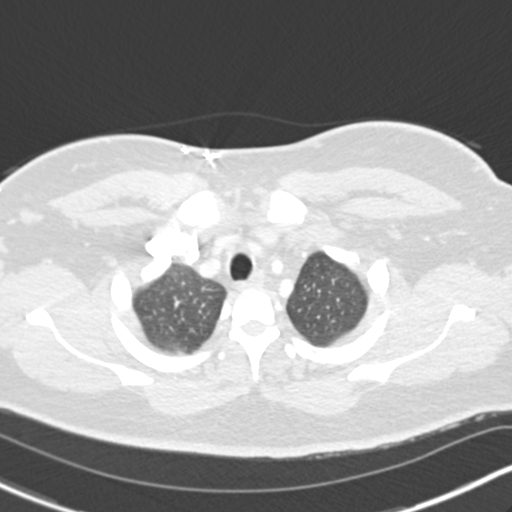
[im 205/225  soft-tissue]
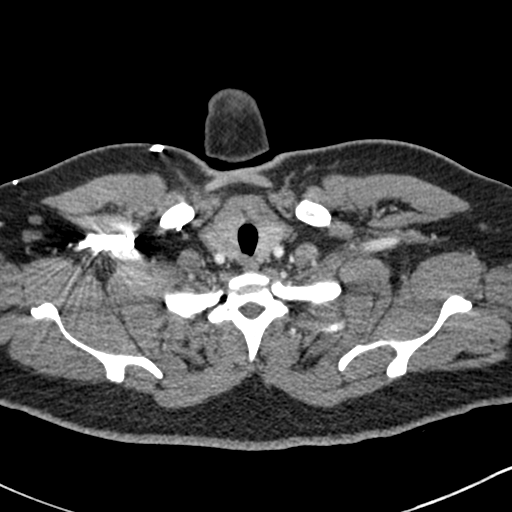
[im 215/225  lung]
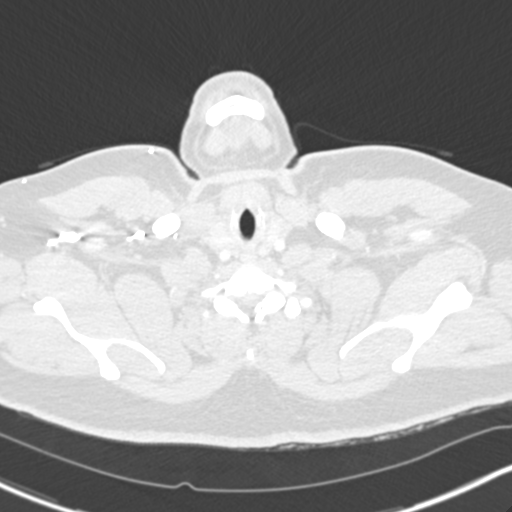

[Series 7: coronal mpr · coronal · 0.46mm/px · 2 of 84 slices shown]
[im 28/84  soft-tissue]
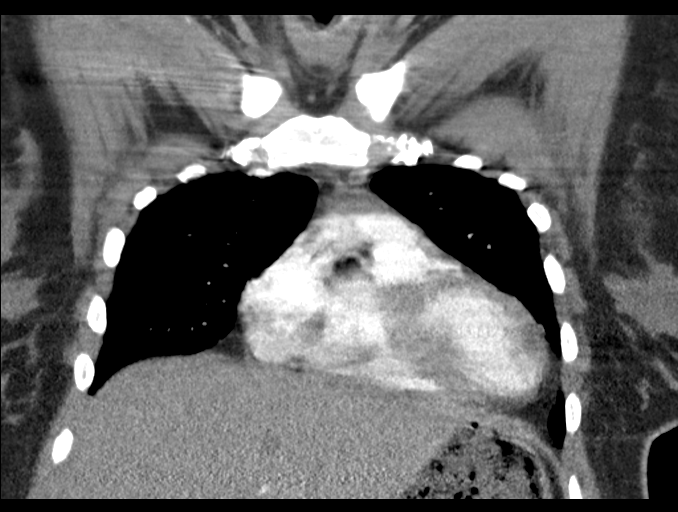
[im 56/84  soft-tissue]
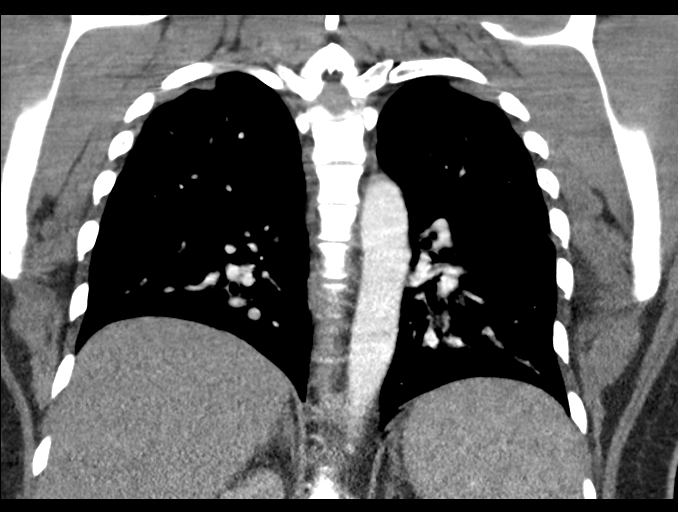

[19 of 46 positions shown; findings below may reference images not displayed]

FINDINGS: Cardiovascular: Satisfactory opacification of the pulmonary arteries
to the segmental level. No evidence of pulmonary embolism. Normal
heart size. No pericardial effusion.

Mediastinum/Nodes: No enlarged mediastinal, hilar, or axillary lymph
nodes. Thyroid gland, trachea, and esophagus demonstrate no
significant findings.

Lungs/Pleura: Lungs are clear. No pleural effusion or pneumothorax.

Upper Abdomen: Negative.

Musculoskeletal: Negative.

Review of the MIP images confirms the above findings.
IMPRESSION: Negative for pulmonary embolus.  Negative chest CT.

## 2022-08-10 ENCOUNTER — Ambulatory Visit: Payer: Medicaid Other | Attending: Family Medicine

## 2022-08-18 ENCOUNTER — Ambulatory Visit: Payer: Medicaid Other

## 2022-09-02 ENCOUNTER — Other Ambulatory Visit: Payer: Self-pay | Admitting: Family Medicine

## 2022-09-02 NOTE — Telephone Encounter (Signed)
Dc'd 05/30/22 Louanna Raw CMA   Requested Prescriptions  Refused Prescriptions Disp Refills   ibuprofen (ADVIL) 600 MG tablet [Pharmacy Med Name: IBUPROFEN 600 MG TABLET] 30 tablet     Sig: TAKE 1 TABLET BY MOUTH EVERY 6 HOURS     Analgesics:  NSAIDS Failed - 09/02/2022 10:11 AM      Failed - Manual Review: Labs are only required if the patient has taken medication for more than 8 weeks.      Passed - Cr in normal range and within 360 days    Creatinine, Ser  Date Value Ref Range Status  05/30/2022 0.62 0.57 - 1.00 mg/dL Final   Creatinine, Urine  Date Value Ref Range Status  11/13/2020 49 mg/dL Final         Passed - HGB in normal range and within 360 days    Hemoglobin  Date Value Ref Range Status  06/20/2022 12.4 11.1 - 15.9 g/dL Final         Passed - PLT in normal range and within 360 days    Platelets  Date Value Ref Range Status  06/20/2022 364 150 - 450 x10E3/uL Final         Passed - HCT in normal range and within 360 days    Hematocrit  Date Value Ref Range Status  06/20/2022 39.8 34.0 - 46.6 % Final         Passed - eGFR is 30 or above and within 360 days    GFR calc Af Amer  Date Value Ref Range Status  02/03/2020 129 >59 mL/min/1.73 Final    Comment:    **[facility] currently reports eGFR in compliance with the current**   recommendations of the [employer]. [facility] will   update reporting as new guidelines are published from the NKF-ASN   Task force.    GFR, Estimated  Date Value Ref Range Status  11/13/2020 >60 >60 mL/min Final    Comment:    (NOTE) Calculated using the CKD-EPI Creatinine Equation (2021)    eGFR  Date Value Ref Range Status  05/30/2022 120 >59 mL/min/1.73 Final         Passed - Patient is not pregnant      Passed - Valid encounter within last 12 months    Recent Outpatient Visits           1 month ago [place] P, DO   2 months ago Rotator cuff  impingement syndrome, left   [place] P, DO   2 months ago Rotator cuff impingement syndrome, left   [facility] [place], Earley Abide, MD   3 months ago Routine general medical examination at a health care facility   [place], [place], DO   2 years ago Less than [redacted] weeks gestation of pregnancy   [place], Megan P, DO       Future Appointments             In 3 months Wynetta Emery, Barb Merino, DO [place], [facility]

## 2022-09-06 ENCOUNTER — Other Ambulatory Visit: Payer: Self-pay | Admitting: Family Medicine

## 2022-09-07 NOTE — Telephone Encounter (Signed)
Unable to refill per protocol, Rx was discontinued 40/81/44, duplicate request. Will refuse.  Requested Prescriptions  Pending Prescriptions Disp Refills   ibuprofen (ADVIL) 600 MG tablet [Pharmacy Med Name: IBUPROFEN 600 MG TABLET] 30 tablet     Sig: TAKE 1 TABLET BY MOUTH EVERY 6 HOURS     Analgesics:  NSAIDS Failed - 09/06/2022 12:17 PM      Failed - Manual Review: Labs are only required if the patient has taken medication for more than 8 weeks.      Passed - Cr in normal range and within 360 days    Creatinine, Ser  Date Value Ref Range Status  05/30/2022 0.62 0.57 - 1.00 mg/dL Final   Creatinine, Urine  Date Value Ref Range Status  11/13/2020 49 mg/dL Final         Passed - HGB in normal range and within 360 days    Hemoglobin  Date Value Ref Range Status  06/20/2022 12.4 11.1 - 15.9 g/dL Final         Passed - PLT in normal range and within 360 days    Platelets  Date Value Ref Range Status  06/20/2022 364 150 - 450 x10E3/uL Final         Passed - HCT in normal range and within 360 days    Hematocrit  Date Value Ref Range Status  06/20/2022 39.8 34.0 - 46.6 % Final         Passed - eGFR is 30 or above and within 360 days    GFR calc Af Amer  Date Value Ref Range Status  02/03/2020 129 >59 mL/min/1.73 Final    Comment:    **[facility] currently reports eGFR in compliance with the current**   recommendations of the [employer]. [facility] will   update reporting as new guidelines are published from the NKF-ASN   Task force.    GFR, Estimated  Date Value Ref Range Status  11/13/2020 >60 >60 mL/min Final    Comment:    (NOTE) Calculated using the CKD-EPI Creatinine Equation (2021)    eGFR  Date Value Ref Range Status  05/30/2022 120 >59 mL/min/1.73 Final         Passed - Patient is not pregnant      Passed - Valid encounter within last 12 months    Recent Outpatient Visits           1 month ago [place] P, DO   2 months ago Rotator cuff impingement syndrome, left   [place] P, DO   3 months ago Rotator cuff impingement syndrome, left   [facility] [place], Earley Abide, MD   3 months ago Routine general medical examination at a health care facility   [place], [place], DO   2 years ago Less than [redacted] weeks gestation of pregnancy   [place], Megan P, DO       Future Appointments             In 3 months Wynetta Emery, Barb Merino, DO [place], [facility]

## 2022-10-09 DIAGNOSIS — Z793 Long term (current) use of hormonal contraceptives: Secondary | ICD-10-CM | POA: Diagnosis not present

## 2022-10-09 DIAGNOSIS — T783XXA Angioneurotic edema, initial encounter: Secondary | ICD-10-CM | POA: Diagnosis not present

## 2022-10-09 DIAGNOSIS — J029 Acute pharyngitis, unspecified: Secondary | ICD-10-CM | POA: Diagnosis not present

## 2022-10-09 DIAGNOSIS — J020 Streptococcal pharyngitis: Secondary | ICD-10-CM | POA: Diagnosis not present

## 2022-10-09 DIAGNOSIS — J45909 Unspecified asthma, uncomplicated: Secondary | ICD-10-CM | POA: Diagnosis not present

## 2022-10-09 DIAGNOSIS — R221 Localized swelling, mass and lump, neck: Secondary | ICD-10-CM | POA: Diagnosis not present

## 2022-10-09 DIAGNOSIS — F17210 Nicotine dependence, cigarettes, uncomplicated: Secondary | ICD-10-CM | POA: Diagnosis not present

## 2022-10-09 DIAGNOSIS — J392 Other diseases of pharynx: Secondary | ICD-10-CM | POA: Diagnosis not present

## 2022-10-09 DIAGNOSIS — Z5329 Procedure and treatment not carried out because of patient's decision for other reasons: Secondary | ICD-10-CM | POA: Diagnosis not present

## 2022-10-09 DIAGNOSIS — Z79899 Other long term (current) drug therapy: Secondary | ICD-10-CM | POA: Diagnosis not present

## 2022-10-20 ENCOUNTER — Ambulatory Visit: Payer: Medicaid Other | Admitting: Family Medicine

## 2022-10-20 ENCOUNTER — Encounter: Payer: Self-pay | Admitting: Family Medicine

## 2022-10-20 VITALS — BP 105/63 | HR 76 | Temp 100.0°F | Ht 65.0 in | Wt 153.4 lb

## 2022-10-20 DIAGNOSIS — Z23 Encounter for immunization: Secondary | ICD-10-CM | POA: Diagnosis not present

## 2022-10-20 DIAGNOSIS — M6208 Separation of muscle (nontraumatic), other site: Secondary | ICD-10-CM

## 2022-10-20 NOTE — Progress Notes (Signed)
BP 105/63   Pulse 76   Temp 100 F (37.8 C) (Oral)   Ht '5\' 5"'$  (1.651 m)   Wt 153 lb 6.4 oz (69.6 kg)   SpO2 98%   BMI 25.53 kg/m    Subjective:    Patient ID: Caitlin Ortega, female    DOB: October 09, 1985, 37 y.o.   MRN: [id number]  HPI: Caitlin Ortega is a 37 y.o. female  Chief Complaint  Patient presents with   Mass    Patient says she has noticed a lump near her navel area and says she notices it more when she lays down. Patient says she would like to discuss possible hernia.    LUMP Duration: about month Location: just above her belly button Onset: sudden Painful: no Discomfort: no Status:  smaller Trauma: no Redness: no Bruising: no Recent infection: no Swollen lymph nodes: no Requesting removal: no History of cancer: no Family history of cancer: no History of the same: no Associated signs and symptoms:  Relevant past medical, surgical, family and social history reviewed and updated as indicated. Interim medical history since our last visit reviewed. Allergies and medications reviewed and updated.  Review of Systems  Constitutional: Negative.   Respiratory: Negative.    Cardiovascular: Negative.   Gastrointestinal: Negative.   Musculoskeletal: Negative.   Neurological: Negative.   Psychiatric/Behavioral: Negative.      Per HPI unless specifically indicated above     Objective:    BP 105/63   Pulse 76   Temp 100 F (37.8 C) (Oral)   Ht '5\' 5"'$  (1.651 m)   Wt 153 lb 6.4 oz (69.6 kg)   SpO2 98%   BMI 25.53 kg/m   Wt Readings from Last 3 Encounters:  10/20/22 153 lb 6.4 oz (69.6 kg)  06/20/22 162 lb 9.6 oz (73.8 kg)  06/07/22 164 lb (74.4 kg)    Physical Exam Vitals and nursing note reviewed.  Constitutional:      General: She is not in acute distress.    Appearance: Normal appearance. She is not ill-appearing, toxic-appearing or diaphoretic.  HENT:     Head: Normocephalic and atraumatic.     Right Ear: External ear normal.     Left Ear:  External ear normal.     Nose: Nose normal.     Mouth/Throat:     Mouth: Mucous membranes are moist.     Pharynx: Oropharynx is clear.  Eyes:     General: No scleral icterus.       Right eye: No discharge.        Left eye: No discharge.     Extraocular Movements: Extraocular movements intact.     Conjunctiva/sclera: Conjunctivae normal.     Pupils: Pupils are equal, round, and reactive to light.  Cardiovascular:     Rate and Rhythm: Normal rate and regular rhythm.     Pulses: Normal pulses.     Heart sounds: Normal heart sounds. No murmur heard.    No friction rub. No gallop.  Pulmonary:     Effort: Pulmonary effort is normal. No respiratory distress.     Breath sounds: Normal breath sounds. No stridor. No wheezing, rhonchi or rales.  Chest:     Chest wall: No tenderness.  Musculoskeletal:        General: Normal range of motion.     Cervical back: Normal range of motion and neck supple.  Skin:    General: Skin is warm and dry.     Capillary  Refill: Capillary refill takes less than 2 seconds.     Coloration: Skin is not jaundiced or pale.     Findings: No bruising, erythema, lesion or rash.  Neurological:     General: No focal deficit present.     Mental Status: She is alert and oriented to person, place, and time. Mental status is at baseline.  Psychiatric:        Mood and Affect: Mood normal.        Behavior: Behavior normal.        Thought Content: Thought content normal.        Judgment: Judgment normal.     Results for orders placed or performed in visit on 06/20/22  CBC with Differential/Platelet  Result Value Ref Range   WBC 6.8 3.4 - 10.8 x10E3/uL   RBC 5.13 3.77 - 5.28 x10E6/uL   Hemoglobin 12.4 11.1 - 15.9 g/dL   Hematocrit 39.8 34.0 - 46.6 %   MCV 78 (L) 79 - 97 fL   MCH 24.2 (L) 26.6 - 33.0 pg   MCHC 31.2 (L) 31.5 - 35.7 g/dL   RDW 15.3 11.7 - 15.4 %   Platelets 364 150 - 450 x10E3/uL   Neutrophils 61 Not Estab. %   Lymphs 31 Not Estab. %   Monocytes  5 Not Estab. %   Eos 2 Not Estab. %   Basos 1 Not Estab. %   Neutrophils Absolute 4.1 1.4 - 7.0 x10E3/uL   Lymphocytes Absolute 2.1 0.7 - 3.1 x10E3/uL   Monocytes Absolute 0.3 0.1 - 0.9 x10E3/uL   EOS (ABSOLUTE) 0.1 0.0 - 0.4 x10E3/uL   Basophils Absolute 0.1 0.0 - 0.2 x10E3/uL   Immature Granulocytes 0 Not Estab. %   Immature Grans (Abs) 0.0 0.0 - 0.1 x10E3/uL  Thyroid Panel With TSH  Result Value Ref Range   TSH 0.343 (L) 0.450 - 4.500 uIU/mL   T4, Total 9.2 4.5 - 12.0 ug/dL   T3 Uptake Ratio 27 24 - 39 %   Free Thyroxine Index 2.5 1.2 - 4.9      Assessment & Plan:   Problem List Items Addressed This Visit   None Visit Diagnoses     Diastasis recti    -  Primary   Reassured patient. Information provided. Call with any concerns.   Need for diphtheria-tetanus-pertussis (Tdap) vaccine       Tdap given today.   Relevant Orders   Tdap vaccine greater than or equal to 7yo IM        Follow up plan: Return October Physical.

## 2022-12-08 ENCOUNTER — Ambulatory Visit: Payer: Self-pay

## 2022-12-08 NOTE — Telephone Encounter (Signed)
Appt, virtual OK 

## 2022-12-08 NOTE — Telephone Encounter (Signed)
LVM for patient to call office back to get scheduled regarding her concerns. Also put CRM.

## 2022-12-08 NOTE — Telephone Encounter (Signed)
Pt called asking if Dr. Laural Benes can up her medication for anxiety.  She was tearful on the phone but did not want to speak to anyone .  She said she just wanted to know if she could take more than more plll.   Chief Complaint: Pt. Feeling anxious last night and today, tearful."Nothing happened . I'm just having an episode. I really don't want to talk much now." Asking how often she can take hydroxyzine. "I only take it at night, but I know I can take it every 8 hours."  Asking how often she should take her medication.  Symptoms: Above Frequency: Last night Pertinent Negatives: Patient denies  Disposition: [] ED /[] Urgent Care (no appt availability in office) / [] Appointment(In office/virtual)/ []  [facility]/ [] Home Care/ [] Refused Recommended Disposition /[] [facility]/ [x]  Follow-up with PCP Additional Notes: Please advise pt.  Answer Assessment - Initial Assessment Questions 1. CONCERN: "Did anything happen that prompted you to call today?"      Anxiety 2. ANXIETY SYMPTOMS: "Can you describe how you (your loved one; patient) have been feeling?" (e.g., tense, restless, panicky, anxious, keyed up, overwhelmed, sense of impending doom).      Anxiety 3. ONSET: "How long have you been feeling this way?" (e.g., hours, days, weeks)     Today 4. SEVERITY: "How would you rate the level of anxiety?" (e.g., 0 - 10; or mild, moderate, severe).     Severe 5. FUNCTIONAL IMPAIRMENT: "How have these feelings affected your ability to do daily activities?" "Have you had more difficulty than usual doing your normal daily activities?" (e.g., getting better, same, worse; self-care, school, work, interactions)     No 6. HISTORY: "Have you felt this way before?" "Have you ever been diagnosed with an anxiety problem in the past?" (e.g., generalized anxiety disorder, panic attacks, PTSD). If Yes, ask: "How was this problem treated?" (e.g., medicines, counseling, etc.)     Yes 7. RISK OF HARM -  SUICIDAL IDEATION: "Do you ever have thoughts of hurting or killing yourself?" If Yes, ask:  "Do you have these feelings now?" "Do you have a plan on how you would do this?"     No 8. TREATMENT:  "What has been done so far to treat this anxiety?" (e.g., medicines, relaxation strategies). "What has helped?"     Medication 9. TREATMENT - THERAPIST: "Do you have a counselor or therapist? Name?"     No 10. POTENTIAL TRIGGERS: "Do you drink caffeinated beverages (e.g., coffee, colas, teas), and how much daily?" "Do you drink alcohol or use any drugs?" "Have you started any new medicines recently?"       No 11. PATIENT SUPPORT: "Who is with you now?" "Who do you live with?" "Do you have family or friends who you can talk to?"        Has family, friends 72. OTHER SYMPTOMS: "Do you have any other symptoms?" (e.g., feeling depressed, trouble concentrating, trouble sleeping, trouble breathing, palpitations or fast heartbeat, chest pain, sweating, nausea, or diarrhea)       No 13. PREGNANCY: "Is there any chance you are pregnant?" "When was your last menstrual period?"       No  Protocols used: Anxiety and Panic Attack-A-AH

## 2022-12-19 ENCOUNTER — Encounter: Payer: Medicaid Other | Admitting: Family Medicine

## 2023-03-15 ENCOUNTER — Emergency Department (HOSPITAL_COMMUNITY)
Admission: EM | Admit: 2023-03-15 | Discharge: 2023-03-16 | Disposition: A | Discharge status: Other Acute Inpatient Hospital | Payer: Medicaid Other | Source: Home / Self Care | Attending: Emergency Medicine | Admitting: Emergency Medicine

## 2023-03-15 ENCOUNTER — Encounter (HOSPITAL_COMMUNITY): Payer: Self-pay

## 2023-03-15 DIAGNOSIS — J45909 Unspecified asthma, uncomplicated: Secondary | ICD-10-CM | POA: Diagnosis not present

## 2023-03-15 DIAGNOSIS — F29 Unspecified psychosis not due to a substance or known physiological condition: Secondary | ICD-10-CM | POA: Insufficient documentation

## 2023-03-15 DIAGNOSIS — Z87891 Personal history of nicotine dependence: Secondary | ICD-10-CM | POA: Diagnosis not present

## 2023-03-15 DIAGNOSIS — F22 Delusional disorders: Secondary | ICD-10-CM | POA: Insufficient documentation

## 2023-03-15 DIAGNOSIS — F1210 Cannabis abuse, uncomplicated: Secondary | ICD-10-CM | POA: Insufficient documentation

## 2023-03-15 DIAGNOSIS — F99 Mental disorder, not otherwise specified: Secondary | ICD-10-CM

## 2023-03-15 LAB — COMPREHENSIVE METABOLIC PANEL
ALT: 21 U/L (ref 0–44)
AST: 23 U/L (ref 15–41)
Albumin: 4.3 g/dL (ref 3.5–5.0)
Alkaline Phosphatase: 44 U/L (ref 38–126)
Anion gap: 14 (ref 5–15)
BUN: 18 mg/dL (ref 6–20)
CO2: 17 mmol/L — ABNORMAL LOW (ref 22–32)
Calcium: 9.1 mg/dL (ref 8.9–10.3)
Chloride: 103 mmol/L (ref 98–111)
Creatinine, Ser: 0.93 mg/dL (ref 0.44–1.00)
GFR, Estimated: >60 mL/min (ref >60)
Glucose, Bld: 206 mg/dL — ABNORMAL HIGH (ref 70–99)
Potassium: 3 mmol/L — ABNORMAL LOW (ref 3.5–5.1)
Sodium: 134 mmol/L — ABNORMAL LOW (ref 135–145)
Total Bilirubin: 0.9 mg/dL (ref 0.3–1.2)
Total Protein: 7.8 g/dL (ref 6.5–8.1)

## 2023-03-15 LAB — CBC
HCT: 36.5 % (ref 36.0–46.0)
Hemoglobin: 11.5 g/dL — ABNORMAL LOW (ref 12.0–15.0)
MCH: 23.7 pg — ABNORMAL LOW (ref 26.0–34.0)
MCHC: 31.5 g/dL (ref 30.0–36.0)
MCV: 75.3 fL — ABNORMAL LOW (ref 80.0–100.0)
Platelets: 344 10*3/uL (ref 150–400)
RBC: 4.85 MIL/uL (ref 3.87–5.11)
RDW: 15.1 % (ref 11.5–15.5)
WBC: 12.1 10*3/uL — ABNORMAL HIGH (ref 4.0–10.5)
nRBC: 0 % (ref 0.0–0.2)

## 2023-03-15 LAB — HCG, QUANTITATIVE, PREGNANCY: hCG, Beta Chain, Quant, S: <1 m[IU]/mL (ref <5)

## 2023-03-15 LAB — ACETAMINOPHEN LEVEL: Acetaminophen (Tylenol), Serum: <10 ug/mL — ABNORMAL LOW (ref 10–30)

## 2023-03-15 LAB — SALICYLATE LEVEL: Salicylate Lvl: <7 mg/dL — ABNORMAL LOW (ref 7.0–30.0)

## 2023-03-15 LAB — ETHANOL: Alcohol, Ethyl (B): <10 mg/dL (ref <10)
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
## 2023-03-16 ENCOUNTER — Inpatient Hospital Stay (HOSPITAL_COMMUNITY)
Admission: AD | Admit: 2023-03-16 | Discharge: 2023-03-21 | DRG: 885 | Disposition: A | Discharge status: Home / Self Care | Payer: Medicaid Other | Source: Intra-hospital | Attending: Psychiatry | Admitting: Psychiatry

## 2023-03-16 ENCOUNTER — Encounter (HOSPITAL_COMMUNITY): Payer: Self-pay | Admitting: Psychiatry

## 2023-03-16 DIAGNOSIS — G4700 Insomnia, unspecified: Secondary | ICD-10-CM | POA: Diagnosis present

## 2023-03-16 DIAGNOSIS — R41843 Psychomotor deficit: Secondary | ICD-10-CM | POA: Diagnosis present

## 2023-03-16 DIAGNOSIS — F22 Delusional disorders: Secondary | ICD-10-CM

## 2023-03-16 DIAGNOSIS — F32A Depression, unspecified: Secondary | ICD-10-CM | POA: Diagnosis present

## 2023-03-16 DIAGNOSIS — Z87891 Personal history of nicotine dependence: Secondary | ICD-10-CM

## 2023-03-16 DIAGNOSIS — F23 Brief psychotic disorder: Principal | ICD-10-CM | POA: Diagnosis present

## 2023-03-16 DIAGNOSIS — F4010 Social phobia, unspecified: Secondary | ICD-10-CM | POA: Diagnosis present

## 2023-03-16 DIAGNOSIS — Z8632 Personal history of gestational diabetes: Secondary | ICD-10-CM

## 2023-03-16 DIAGNOSIS — F1210 Cannabis abuse, uncomplicated: Secondary | ICD-10-CM | POA: Diagnosis not present

## 2023-03-16 DIAGNOSIS — Z79899 Other long term (current) drug therapy: Secondary | ICD-10-CM | POA: Diagnosis not present

## 2023-03-16 MED ORDER — RISPERIDONE 1 MG PO TABS
1.0000 mg | ORAL_TABLET | Freq: Every day | ORAL | Status: DC
  Administered 2023-03-16: 1 mg via ORAL
  Filled 2023-03-16: qty 1

## 2023-03-16 MED ORDER — POTASSIUM CHLORIDE CRYS ER 20 MEQ PO TBCR
40.0000 meq | EXTENDED_RELEASE_TABLET | Freq: Once | ORAL | Status: AC
  Administered 2023-03-16: 40 meq via ORAL
  Filled 2023-03-16: qty 2

## 2023-03-16 MED ORDER — RISPERIDONE 1 MG PO TABS: 1.0000 mg | ORAL_TABLET | Freq: Every day | ORAL | Status: DC
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
## 2023-03-16 NOTE — ED Notes (Signed)
Spoke w/ pt about urine that she had in a cup and informed her that there wasn't another order for urine. Because this RN was going to throw it away. Pt said she would keep it for her for evidence. Pt alert and oriented x 3, disoriented to situation. Pt w/ paranoid delusions.

## 2023-03-16 NOTE — Progress Notes (Signed)
Pt meets inpatient BH placement per Rockney Ghee, NP. CSW requested Night [facility] Edythe Clarity, RN to review.   Maryjean Ka, MSW, [facility] 03/16/2023 3:54 AM
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
## 2023-03-16 NOTE — ED Notes (Addendum)
Belongings placed in locker 4 

## 2023-03-16 NOTE — Progress Notes (Signed)
Pt has been accepted to [facility] TODAY 03/16/2023, pending signed voluntary consent faxed to [phone]. Bed assignment: 500-1  Pt meets inpatient criteria per Arsenio Loader, NP  Attending Physician will be Phineas Inches, MD  Report can be called to: - Adult unit: [phone]  Pt can arrive after 2000  Care Team Notified: [facility] Malva Limes, RN, Denton Ar, RN, and Arsenio Loader, NP  Cathie Beams, LCSW  03/16/2023 3:01 PM

## 2023-03-16 NOTE — Progress Notes (Signed)
Patient transported off unit with security.

## 2023-03-16 NOTE — ED Notes (Signed)
Pts personal cell phone locked up with security

## 2023-03-16 NOTE — ED Notes (Signed)
Pt participating in TTS at this time.

## 2023-03-17 ENCOUNTER — Encounter (HOSPITAL_COMMUNITY): Payer: Self-pay | Admitting: Psychiatry

## 2023-03-17 ENCOUNTER — Encounter (HOSPITAL_COMMUNITY): Payer: Self-pay

## 2023-03-17 ENCOUNTER — Other Ambulatory Visit: Payer: Self-pay

## 2023-03-17 DIAGNOSIS — F22 Delusional disorders: Secondary | ICD-10-CM | POA: Diagnosis not present

## 2023-03-17 LAB — LIPID PANEL
Cholesterol: 137 mg/dL (ref 0–200)
HDL: 48 mg/dL (ref >40)
LDL Cholesterol: 83 mg/dL (ref 0–99)
Total CHOL/HDL Ratio: 2.9 RATIO
Triglycerides: 29 mg/dL (ref <150)
VLDL: 6 mg/dL (ref 0–40)

## 2023-03-17 LAB — HEMOGLOBIN A1C
Hgb A1c MFr Bld: 5.5 % (ref 4.8–5.6)
Mean Plasma Glucose: 111.15 mg/dL

## 2023-03-17 MED ORDER — ACETAMINOPHEN 325 MG PO TABS: 650.0000 mg | ORAL_TABLET | Freq: Four times a day (QID) | ORAL | Status: DC | PRN

## 2023-03-17 MED ORDER — MAGNESIUM HYDROXIDE 400 MG/5ML PO SUSP: 30.0000 mL | Freq: Every day | ORAL | Status: DC | PRN

## 2023-03-17 MED ORDER — DIPHENHYDRAMINE HCL 25 MG PO CAPS: 50.0000 mg | ORAL_CAPSULE | Freq: Three times a day (TID) | ORAL | Status: DC | PRN

## 2023-03-17 MED ORDER — LORAZEPAM 1 MG PO TABS: 2.0000 mg | ORAL_TABLET | Freq: Three times a day (TID) | ORAL | Status: DC | PRN

## 2023-03-17 MED ORDER — HALOPERIDOL 5 MG PO TABS: 5.0000 mg | ORAL_TABLET | Freq: Three times a day (TID) | ORAL | Status: DC | PRN

## 2023-03-17 MED ORDER — TRAZODONE HCL 50 MG PO TABS
50.0000 mg | ORAL_TABLET | Freq: Every evening | ORAL | Status: DC | PRN
  Filled 2023-03-17: qty 1

## 2023-03-17 MED ORDER — ALUM & MAG HYDROXIDE-SIMETH 200-200-20 MG/5ML PO SUSP: 30.0000 mL | ORAL | Status: DC | PRN

## 2023-03-17 MED ORDER — HALOPERIDOL LACTATE 5 MG/ML IJ SOLN: 5.0000 mg | Freq: Three times a day (TID) | INTRAMUSCULAR | Status: DC | PRN

## 2023-03-17 MED ORDER — RISPERIDONE 1 MG PO TABS
1.0000 mg | ORAL_TABLET | Freq: Every day | ORAL | Status: DC
  Administered 2023-03-17 – 2023-03-21 (×5): 1 mg via ORAL
  Filled 2023-03-17 (×8): qty 1

## 2023-03-17 MED ORDER — DIPHENHYDRAMINE HCL 50 MG/ML IJ SOLN: 50.0000 mg | Freq: Three times a day (TID) | INTRAMUSCULAR | Status: DC | PRN

## 2023-03-17 MED ORDER — LORAZEPAM 2 MG/ML IJ SOLN: 2.0000 mg | Freq: Three times a day (TID) | INTRAMUSCULAR | Status: DC | PRN

## 2023-03-17 NOTE — Tx Team (Signed)
Initial Treatment Plan 03/17/2023 12:36 AM Caitlin Ortega Caitlin Ortega WNU:[id number]    PATIENT STRESSORS: Substance abuse   Traumatic event     PATIENT STRENGTHS: Capable of independent living  Financial means  Physical Health  Supportive family/friends  Work skills    PATIENT IDENTIFIED PROBLEMS: Paranoia  Anxiety  "Protection for me and my children"                 DISCHARGE CRITERIA:  Ability to meet basic life and health needs Improved stabilization in mood, thinking, and/or behavior Medical problems require only outpatient monitoring Motivation to continue treatment in a less acute level of care  PRELIMINARY DISCHARGE PLAN: Attend PHP/IOP Outpatient therapy Return to previous living arrangement Return to previous work or school arrangements  PATIENT/FAMILY INVOLVEMENT: This treatment plan has been presented to and reviewed with the patient, Caitlin Ortega, and/or family member.  The patient and family have been given the opportunity to ask questions and make suggestions.  Bethann Punches, RN 03/17/2023, 12:36 AM
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
## 2023-03-17 NOTE — Plan of Care (Signed)
  Problem: Education: Goal: Knowledge of [facility] Education information/materials will improve Outcome: Progressing   Problem: Activity: Goal: Interest or engagement in activities will improve Outcome: Progressing   Problem: Coping: Goal: Ability to verbalize frustrations and anger appropriately will improve Outcome: Progressing   Problem: Health Behavior/Discharge Planning: Goal: Identification of resources available to assist in meeting health care needs will improve Outcome: Progressing
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
## 2023-03-17 NOTE — Progress Notes (Signed)
D- Patient alert and oriented. Denies SI, HI, AVH, and pain.   A- Support and encouragement provided.  Routine safety checks conducted every 15 minutes.  Patient informed to notify staff with problems or concerns.  R- Patient contracts for safety at this time. Patient compliant with medications and treatment plan. Patient receptive, calm, and cooperative. Patient interacts well with others on the unit.  Patient remains safe at this time.     03/17/23 2000  Psych Admission Type (Psych Patients Only)  Admission Status Voluntary  Psychosocial Assessment  Patient Complaints Worrying  Eye Contact Fair  Facial Expression Animated  Affect Appropriate to circumstance  Speech Logical/coherent  Interaction Assertive  Motor Activity Other (Comment) (WDL)  Appearance/Hygiene Unremarkable  Behavior Characteristics Cooperative;Appropriate to situation  Mood Pleasant  Thought Process  Coherency Blocking  Content Preoccupation  Delusions None reported or observed  Perception WDL  Hallucination None reported or observed  Judgment Poor  Confusion None  Danger to Self  Current suicidal ideation? Denies  Agreement Not to Harm Self Yes  Description of Agreement Verbal  Danger to Others  Danger to Others None reported or observed

## 2023-03-17 NOTE — Plan of Care (Signed)
°  Problem: Education: °Goal: Emotional status will improve °Outcome: Progressing °Goal: Mental status will improve °Outcome: Progressing °Goal: Verbalization of understanding the information provided will improve °Outcome: Progressing °  °

## 2023-03-17 NOTE — BHH Group Notes (Signed)
Spirituality group facilitated by Chaplain Matthew Stalnaker, MDiv, B[facility].  Group Description:  Group focused on topic of hope.  Patients participated in facilitated discussion around topic, connecting with one another around experiences and definitions for hope.  Group members engaged with visual explorer photos, reflecting on what hope looks like for them today.  Group engaged in discussion around how their definitions of hope are present today in hospital.   Modalities: Psycho-social ed, Adlerian, Narrative, MI Patient Progress: DID NOT ATTEND  

## 2023-03-17 NOTE — Plan of Care (Signed)
  Problem: Education: Goal: Knowledge of [facility] Education information/materials will improve Outcome: Progressing   Problem: Activity: Goal: Interest or engagement in activities will improve Outcome: Progressing   Problem: Coping: Goal: Ability to verbalize frustrations and anger appropriately will improve Outcome: Progressing   Problem: Physical Regulation: Goal: Ability to maintain clinical measurements within normal limits will improve Outcome: Progressing   Problem: Safety: Goal: Periods of time without injury will increase Outcome: Progressing

## 2023-03-17 NOTE — [facility] Suicide Risk Assessment (Signed)
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  She was admitted voluntarily for further treatment.  Continued Clinical Symptoms:  Alcohol Use Disorder Identification Test Final Score (AUDIT): 0 The "Alcohol Use Disorders Identification Test", Guidelines for Use in Primary Care, Second Edition.  World Science writer [facility]). Score between 0-7:  no or low risk or alcohol related problems. Score between 8-15:  moderate risk of alcohol related problems. Score between 16-19:  high risk of alcohol related problems. Score 20 or above:  warrants further diagnostic evaluation for alcohol dependence and treatment.   CLINICAL FACTORS:   Severe Anxiety and/or Agitation Personality Disorders:   Cluster C Currently Psychotic   Musculoskeletal: Strength & Muscle Tone: within normal limits Gait & Station: normal Patient leans: N/A  Psychiatric Specialty Exam:  Presentation  General Appearance:  Other (comment) (Atypical interpersonal style with paranoid edge)  Eye Contact: Other (comment) (Animated)  Speech: Normal Rate; Clear and Coherent  Speech Volume: Normal  Handedness: Right   Mood and Affect  Mood: -- ("Fearful")  Affect: Other (comment) (Inappropriate, odd, and atypical smiling and brightening to euthymic)   Thought Process  Thought Processes: Other (comment) (Superficially  linear to circumstantial to tangential; significant disjointed narratives with significant paranoid content)  Descriptions of Associations:Tangential  Orientation:Full (Time, Place and Person)  Thought Content:Paranoid Ideation; Rumination; Perseveration; Delusions  History of Schizophrenia/Schizoaffective disorder:No  Duration of Psychotic Symptoms:Greater than six months  Hallucinations:Hallucinations: Other (comment) (Appears to be RTIS)  Ideas of Reference:Paranoia; Percusatory; Delusions  Suicidal Thoughts:Suicidal Thoughts: No  Homicidal Thoughts:Homicidal Thoughts: No   Sensorium  Memory: Immediate Fair; Recent Fair; Remote Fair  Judgment: Impaired  Insight: Lacking   Executive Functions  Concentration: Fair  Attention Span: Fair  Recall: [employer] of Knowledge: Fair  Language: Fair   Psychomotor Activity  Psychomotor Activity: Psychomotor Activity: Normal   Assets  Assets: Manufacturing systems engineer; Desire for Improvement; Financial Resources/Insurance; Housing; Leisure Time; Physical Health; Resilience; Social Support; Talents/Skills; Transportation; Vocational/Educational   Sleep  Sleep: Sleep: Fair    Physical Exam: Physical Exam Constitutional:      Appearance: Normal appearance.  HENT:     Head: Normocephalic.  Neurological:     General: No focal deficit present.     Mental Status: She is alert and oriented to person, place, and time. Mental status is at baseline.  Psychiatric:        Mood and Affect: Mood normal.        Behavior: Behavior normal.        Thought Content: Thought content normal.    Review of Systems  Psychiatric/Behavioral:  Positive for hallucinations. The patient is nervous/anxious.   All other systems reviewed and are negative.  Blood pressure 105/83, pulse 87, temperature 99.5 F (37.5 C), temperature source Oral, resp. rate 20, height 5\' 6"  (1.676 m), weight 68.2 kg, last menstrual period 03/16/2023,  SpO2 99%, currently breastfeeding. Body mass index is 24.28 kg/m.   COGNITIVE FEATURES THAT CONTRIBUTE TO RISK:  Closed-mindedness and Polarized thinking    SUICIDE RISK:   Mild:  Suicidal ideation of limited frequency, intensity, duration, and specificity.  There are no identifiable plans, no associated intent, mild dysphoria and related symptoms, good self-control (both objective and subjective assessment), few other risk factors, and identifiable protective factors, including available and accessible social support.  PLAN OF CARE: Please see admission assessment for further information.  Patient is admitted for further stabilization and diagnostic clarification.  I certify that inpatient services furnished can reasonably be expected to improve the patient's condition.   Rex Kras, MD 03/17/2023, 11:56 AM

## 2023-03-17 NOTE — BH IP Treatment Plan (Signed)
Interdisciplinary Treatment and Diagnostic Plan Update  03/17/2023 Time of Session: 11:30 AM  LORELEE Ortega MRN: [id number]  Principal Diagnosis: Paranoid type delusional disorder [facility]  Secondary Diagnoses: Principal Problem:   Paranoid type delusional disorder (HCC)   Current Medications:  Current Facility-Administered Medications  Medication Dose Route Frequency Provider Last Rate Last Admin   acetaminophen (TYLENOL) tablet 650 mg  650 mg Oral Q6H PRN Lenox Ponds, NP       alum & mag hydroxide-simeth (MAALOX/MYLANTA) 200-200-20 MG/5ML suspension 30 mL  30 mL Oral Q4H PRN Lenox Ponds, NP       diphenhydrAMINE (BENADRYL) capsule 50 mg  50 mg Oral TID PRN Lenox Ponds, NP       Or   diphenhydrAMINE (BENADRYL) injection 50 mg  50 mg Intramuscular TID PRN Lenox Ponds, NP       haloperidol (HALDOL) tablet 5 mg  5 mg Oral TID PRN Lenox Ponds, NP       Or   haloperidol lactate (HALDOL) injection 5 mg  5 mg Intramuscular TID PRN Lenox Ponds, NP       LORazepam (ATIVAN) tablet 2 mg  2 mg Oral TID PRN Lenox Ponds, NP       Or   LORazepam (ATIVAN) injection 2 mg  2 mg Intramuscular TID PRN Lenox Ponds, NP       magnesium hydroxide (MILK OF MAGNESIA) suspension 30 mL  30 mL Oral Daily PRN Lenox Ponds, NP       risperiDONE (RISPERDAL) tablet 1 mg  1 mg Oral Daily Lenox Ponds, NP   1 mg at 03/17/23 0730   traZODone (DESYREL) tablet 50 mg  50 mg Oral QHS PRN Lenox Ponds, NP       PTA Medications: Medications Prior to Admission  Medication Sig Dispense Refill Last Dose   hydrOXYzine (ATARAX) 25 MG tablet Take 1 tablet (25 mg total) by mouth every 8 (eight) hours as needed. 60 tablet 3     Patient Stressors: Substance abuse   Traumatic event    Patient Strengths: Capable of independent living  Contractor  Physical Health  Supportive family/friends  Work skills   Treatment Modalities: Medication Management, Group therapy, Case  management,  1 to 1 session with clinician, Psychoeducation, Recreational therapy.   Physician Treatment Plan for Primary Diagnosis: Paranoid type delusional disorder (HCC) Long Term Goal(s):     Short Term Goals:    Medication Management: Evaluate patient's response, side effects, and tolerance of medication regimen.  Therapeutic Interventions: 1 to 1 sessions, Unit Group sessions and Medication administration.  Evaluation of Outcomes: Not Progressing  Physician Treatment Plan for Secondary Diagnosis: Principal Problem:   Paranoid type delusional disorder (HCC)  Long Term Goal(s):     Short Term Goals:       Medication Management: Evaluate patient's response, side effects, and tolerance of medication regimen.  Therapeutic Interventions: 1 to 1 sessions, Unit Group sessions and Medication administration.  Evaluation of Outcomes: Not Progressing   RN Treatment Plan for Primary Diagnosis: Paranoid type delusional disorder (HCC) Long Term Goal(s): Knowledge of disease and therapeutic regimen to maintain health will improve  Short Term Goals: Ability to remain free from injury will improve, Ability to verbalize frustration and anger appropriately will improve, Ability to demonstrate self-control, Ability to participate in decision making will improve, Ability to verbalize feelings will improve, Ability to disclose and discuss suicidal ideas, Ability to identify and develop  effective coping behaviors will improve, and Compliance with prescribed medications will improve  Medication Management: RN will administer medications as ordered by provider, will assess and evaluate patient's response and provide education to patient for prescribed medication. RN will report any adverse and/or side effects to prescribing provider.  Therapeutic Interventions: 1 on 1 counseling sessions, Psychoeducation, Medication administration, Evaluate responses to treatment, Monitor vital signs and CBGs as  ordered, Perform/monitor CIWA, COWS, AIMS and Fall Risk screenings as ordered, Perform wound care treatments as ordered.  Evaluation of Outcomes: Not Progressing   LCSW Treatment Plan for Primary Diagnosis: Paranoid type delusional disorder (HCC) Long Term Goal(s): Safe transition to appropriate next level of care at discharge, Engage patient in therapeutic group addressing interpersonal concerns.  Short Term Goals: Engage patient in aftercare planning with referrals and resources, Increase social support, Increase ability to appropriately verbalize feelings, Increase emotional regulation, Facilitate acceptance of mental health diagnosis and concerns, Facilitate patient progression through stages of change regarding substance use diagnoses and concerns, Identify triggers associated with mental health/substance abuse issues, and Increase skills for wellness and recovery  Therapeutic Interventions: Assess for all discharge needs, 1 to 1 time with Social worker, Explore available resources and support systems, Assess for adequacy in community support network, Educate family and significant other(s) on suicide prevention, Complete Psychosocial Assessment, Interpersonal group therapy.  Evaluation of Outcomes: Not Progressing   Progress in Treatment: Attending groups: No. Participating in groups: No. Taking medication as prescribed: Yes. Toleration medication: Yes. Family/Significant other contact made: No, will contact:  Whoever patient give CSW permission to contact  Patient understands diagnosis: No. Discussing patient identified problems/goals with staff: Yes. Medical problems stabilized or resolved: Yes. Denies suicidal/homicidal ideation: Yes. Issues/concerns per patient self-inventory: No.   New problem(s) identified: No, Describe:  None reported   New Short Term/Long Term Goal(s): medication stabilization, elimination of SI thoughts, development of comprehensive mental wellness plan.     Patient Goals:  " get better and not feel paranoid "   Discharge Plan or Barriers: Patient recently admitted. CSW will continue to follow and assess for appropriate referrals and possible discharge planning.    Reason for Continuation of Hospitalization: Anxiety Delusions  Depression Medication stabilization Other; describe Paranoid   Estimated Length of Stay: 5-7 days   Last 3 [place] Suicide Severity Risk Score: Flowsheet Row [facility] (Current[facility]2023 [facility] ED from 03/07/2022 in [facility] Emergency Department at [facility]  C-SSRS RISK CATEGORY No Risk No Risk No Risk       Last PHQ 2/9 Scores:    10/20/2022    4:18 PM 07/19/2022    3:59 PM 05/30/2022    3:27 PM  Depression screen PHQ 2/9  Decreased Interest 0 0 0  Down, Depressed, Hopeless 1 0 0  PHQ - 2 Score 1 0 0  Altered sleeping 0 2 0  Tired, decreased energy 0 2 2  Change in appetite 0 0 1  Feeling bad or failure about yourself  0 0 0  Trouble concentrating 2 0 3  Moving slowly or fidgety/restless 2 0 3  Suicidal thoughts 0 0 0  PHQ-9 Score 5 4 9   Difficult doing work/chores Not difficult at all Somewhat difficult Somewhat difficult    Scribe for Treatment Team: Isabella Bowens, Theresia Majors 03/17/2023 12:10 PM

## 2023-03-17 NOTE — Progress Notes (Addendum)
Patient denies SI, HI and AVH. Patient is calm, pleasant and cooperative throughout shift, reading in room and trying to locate her car which was left somewhere in [facility] when she got picked up by the police. Patient denies paranoia but talks about someone trying to get her. EKG obtained and placed in patient chart. Q 15 minute safety checks ongoing.   03/17/23 1100  Psych Admission Type (Psych Patients Only)  Admission Status Voluntary  Psychosocial Assessment  Patient Complaints Suspiciousness;Nervousness  Eye Contact Fair  Facial Expression Animated  Affect Preoccupied  Speech Logical/coherent  Interaction Assertive  Motor Activity Other (Comment) (wnl)  Appearance/Hygiene In scrubs  Behavior Characteristics Appropriate to situation;Cooperative;Calm  Mood Pleasant;Preoccupied  Thought Process  Coherency Blocking  Content Preoccupation;Paranoia  Delusions Persecutory  Perception WDL  Hallucination None reported or observed  Judgment Poor  Confusion None  Danger to Self  Current suicidal ideation? Denies  Agreement Not to Harm Self Yes  Description of Agreement verbal  Danger to Others  Danger to Others None reported or observed

## 2023-03-17 NOTE — Group Note (Signed)
Recreation Therapy Group Note   Group Topic:General Recreation  Group Date: 03/17/2023 Start Time: 1005 End Time: 1045 Facilitators: Amiri Riechers-McCall, LRT,CTRS Location: 500 Hall Dayroom   Goal Area(s) Addresses:  Patient will use appropriate interactions in play with peers.   Patient will follow directions on first prompt.  Group Description: Trivia. LRT and patients participated in a game of trivia. Patients were to randomly answer the questions as they were presented to the group. LRT provided clues to the questions to if was going to make it a little easier for patients to guess the answers.      Affect/Mood: N/A   Participation Level: Did not attend    Clinical Observations/Individualized Feedback:     Plan: Continue to engage patient in RT group sessions 2-3x/week.   Kashana Breach-McCall, LRT,CTRS  03/17/2023 11:36 AM

## 2023-03-18 DIAGNOSIS — F22 Delusional disorders: Secondary | ICD-10-CM | POA: Diagnosis not present

## 2023-03-18 NOTE — [facility] Suicide Risk Assessment (Signed)
[facility] INPATIENT:  Family/Significant Other Suicide Prevention Education  Suicide Prevention Education:  Education Completed; 03-18-2023, Caitlin Ortega (Friend) [phone] has been identified by the patient as the family member/significant other with whom the patient will be residing, and identified as the person(s) who will aid the patient in the event of a mental health crisis (suicidal ideations/suicide attempt).  With written consent from the patient, the family member/significant other has been provided the following suicide prevention education, prior to the and/or following the discharge of the patient.  The suicide prevention education provided includes the following: Suicide risk factors Suicide prevention and interventions National Suicide Hotline telephone number [facility] assessment telephone number [facility] Emergency Assistance 911 [facility] and/or Residential Mobile Crisis Unit telephone number  Request made of family/significant other to: Remove weapons (e.g., guns, rifles, knives), all items previously/currently identified as safety concern.   Remove drugs/medications (over-the-counter, prescriptions, illicit drugs), all items previously/currently identified as a safety concern.  Caitlin Ortega [place]) [phone] verbalizes understanding of the suicide prevention education information provided.  The family member/significant other agrees to remove the items of safety concern listed above.  Caitlin Ortega 03/18/2023, 3:54 PM

## 2023-03-18 NOTE — [facility] Group Notes (Signed)
[facility] Group Notes:  (Nursing/MHT/Case Management/Adjunct)  Date:  03/18/2023  Time:  8:47 PM  Type of Therapy:  The focus of this group is to help patients review their daily goal of treatment and discuss progress on daily workbooks.   Participation Level:  Active  Participation Quality:  Appropriate, Sharing, and Supportive  Affect:  Appropriate and Excited  Cognitive:  Alert and Appropriate  Insight:  Appropriate, Good, and Improving  Engagement in Group:  Developing/Improving, Engaged, and Supportive  Modes of Intervention:  Discussion, Socialization, and Support  Summary of Progress/Problems: Today's group questions were " What is your favorite childhood memory? And Why is it important for me to do this? (This program)." Pt was very enaged in group today and willing to share a lot into where her recovery is at and where she would like for it to go. Pt stated " my favorite childhood memory is spending time with my brothers and sisters on vacation where we bond and do fun things growing up. Why is it important I'm here?  To focus on myself more, I need self care and I'm learning self care is very valuable to me. Im not use to it and its different for me". Writer went into asking more detail as to how pt feel now that she is focusing more on herself?. Pt replied " its new and sometimes I dont know what to do. Like I started reading a book which I dont normally do and it felt good". Writer suggested pt to look into support groups in order to get out of comfort zone. Pt was hesitate about advice and stated " I dont trust easily". Writer gave advice "part of healing is learning to trust within self and get out of your comfort zone. Educate not only yourself but others to build more support". Pt received suggestion.   Granville Lewis 03/18/2023, 8:47 PM
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
## 2023-03-18 NOTE — Progress Notes (Signed)
   03/18/23 1610  15 Minute Checks  Location Bedroom  Visual Appearance Calm  Behavior Sleeping  Sleep (Behavioral Health Patients Only)  Calculate sleep? (Click Yes once per 24 hr at 0600 safety check) Yes  Documented sleep last 24 hours 6.5

## 2023-03-18 NOTE — Plan of Care (Signed)
Problem: Activity: Goal: Interest or engagement in activities will improve Outcome: Progressing   Problem: Coping: Goal: Ability to demonstrate self-control will improve Outcome: Progressing   Problem: Safety: Goal: Periods of time without injury will increase Outcome: Progressing  Pt A & O to self, place and events. Denies SI, HI, AVH and pain when assessed; none evident thus far. Presents animated with fair eye contact, logical, circumstantial speech regarding incidents leading to admission and ambulatory in milieu with a steady gait. Per pt "I'm fine right now. I'm just here because I went on a date with someone I met online. When he got there, it was a different person from the picture so I checked myself in here". Visible in milieu / dayroom at brief intervals for activities, forwards on interactions. Reports she slept well with good appetite. Off unit for meals, returned without issues. Compliant with scheduled morning medication. Emotional support, encouragement and reassurance offered to pt. Safety checks maintained at Q 15 minutes intervals without incident to note at this time.

## 2023-03-19 DIAGNOSIS — F22 Delusional disorders: Secondary | ICD-10-CM | POA: Diagnosis not present

## 2023-03-19 MED ORDER — TRAZODONE HCL 50 MG PO TABS
50.0000 mg | ORAL_TABLET | Freq: Every day | ORAL | Status: DC
  Administered 2023-03-19: 50 mg via ORAL
  Filled 2023-03-19 (×5): qty 1

## 2023-03-19 NOTE — Progress Notes (Signed)
   03/19/23 0600  15 Minute Checks  Location Bedroom  Visual Appearance Calm  Behavior Sleeping  Sleep (Behavioral Health Patients Only)  Calculate sleep? (Click Yes once per 24 hr at 0600 safety check) Yes  Documented sleep last 24 hours 4.75

## 2023-03-19 NOTE — Plan of Care (Signed)
Problem: Activity: Goal: Interest or engagement in activities will improve Outcome: Progressing   Problem: Safety: Goal: Periods of time without injury will increase Outcome: Progressing  Pt A & O X 3. Denies SI, HI, AVh and pain when assessed. Observed to  be animated, guarded but pleasant on interactions. Confirms poor sleep last night "I had trouble falling asleep last night. I woke up at 1:00 AM and I had trouble fall back to sleep". Remains medication compliant without adverse drug reactions. Treatment team made aware of sleep disturbance. New order received for scheduled Trazodone 50 mg PO in conjunction with PRN Trazodone 50 mg. Pt made aware, verbalized understanding. Safety checks maintained at Q 15 minutes intervals without outburst. Emotional support, encouragement and reassurance offered.

## 2023-03-19 NOTE — Plan of Care (Signed)
  Problem: Education: Goal: Emotional status will improve Outcome: Progressing Goal: Mental status will improve Outcome: Progressing   Problem: Activity: Goal: Interest or engagement in activities will improve Outcome: Progressing   Problem: Coping: Goal: Ability to verbalize frustrations and anger appropriately will improve Outcome: Progressing   

## 2023-03-19 NOTE — Progress Notes (Signed)
   03/18/23 2000  Psych Admission Type (Psych Patients Only)  Admission Status Voluntary  Psychosocial Assessment  Patient Complaints None  Eye Contact Fair  Facial Expression Animated  Affect Appropriate to circumstance  Speech Logical/coherent  Interaction Assertive  Motor Activity Fidgety;Restless  Appearance/Hygiene Unremarkable  Behavior Characteristics Cooperative  Mood Pleasant  Thought Process  Coherency Circumstantial  Content Preoccupation  Delusions None reported or observed  Perception WDL  Hallucination None reported or observed  Judgment Poor  Confusion None  Danger to Self  Current suicidal ideation? Denies  Agreement Not to Harm Self Yes  Description of Agreement verbal  Danger to Others  Danger to Others None reported or observed   Pt is pleasant and cooperative. Pt was offered support and encouragement. Q 15 minute checks were done for safety. Pt attended group and interacts well with peers and staff. Pt has no complaints.Pt receptive to treatment and safety maintained on unit.
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
## 2023-03-19 NOTE — [facility] Group Notes (Signed)
Adult Psychoeducational Group Note  Date:  03/19/2023 Time:  8:18 PM  Group Topic/Focus:  Wrap-Up Group:   The focus of this group is to help patients review their daily goal of treatment and discuss progress on daily workbooks.  Participation Level:  Active  Participation Quality:  Appropriate  Affect:  Appropriate  Cognitive:  Appropriate  Insight: Appropriate  Engagement in Group:  Engaged  Modes of Intervention:  Discussion  Additional Comments:  Pt states that she had a good day today and was able to watch a lot of movies and social. Pt states she's been walking around the unit to help get her exercise for the day. Pt called her kids and spoke with her doctor today. Pt denies everything   Vevelyn Pat 03/19/2023, 8:18 PM

## 2023-03-20 DIAGNOSIS — F23 Brief psychotic disorder: Principal | ICD-10-CM

## 2023-03-20 DIAGNOSIS — F22 Delusional disorders: Secondary | ICD-10-CM

## 2023-03-20 LAB — CBC WITH DIFFERENTIAL/PLATELET
Abs Immature Granulocytes: 0.01 10*3/uL (ref 0.00–0.07)
Basophils Absolute: 0.1 10*3/uL (ref 0.0–0.1)
Basophils Relative: 1 %
Eosinophils Absolute: 0.3 10*3/uL (ref 0.0–0.5)
Eosinophils Relative: 5 %
HCT: 38.7 % (ref 36.0–46.0)
Hemoglobin: 11.8 g/dL — ABNORMAL LOW (ref 12.0–15.0)
Immature Granulocytes: 0 %
Lymphocytes Relative: 36 %
Lymphs Abs: 2.3 10*3/uL (ref 0.7–4.0)
MCH: 23.9 pg — ABNORMAL LOW (ref 26.0–34.0)
MCHC: 30.5 g/dL (ref 30.0–36.0)
MCV: 78.5 fL — ABNORMAL LOW (ref 80.0–100.0)
Monocytes Absolute: 0.4 10*3/uL (ref 0.1–1.0)
Monocytes Relative: 7 %
Neutro Abs: 3.3 10*3/uL (ref 1.7–7.7)
Neutrophils Relative %: 51 %
Platelets: 344 10*3/uL (ref 150–400)
RBC: 4.93 MIL/uL (ref 3.87–5.11)
RDW: 15.1 % (ref 11.5–15.5)
WBC: 6.3 10*3/uL (ref 4.0–10.5)
nRBC: 0 % (ref 0.0–0.2)

## 2023-03-20 LAB — BASIC METABOLIC PANEL
Anion gap: 9 (ref 5–15)
BUN: 13 mg/dL (ref 6–20)
CO2: 24 mmol/L (ref 22–32)
Calcium: 9 mg/dL (ref 8.9–10.3)
Chloride: 103 mmol/L (ref 98–111)
Creatinine, Ser: 0.82 mg/dL (ref 0.44–1.00)
GFR, Estimated: >60 mL/min (ref >60)
Glucose, Bld: 97 mg/dL (ref 70–99)
Potassium: 3.6 mmol/L (ref 3.5–5.1)
Sodium: 136 mmol/L (ref 135–145)

## 2023-03-20 NOTE — Progress Notes (Signed)
   03/20/23 1400  Psych Admission Type (Psych Patients Only)  Admission Status Voluntary  Psychosocial Assessment  Patient Complaints None  Eye Contact Fair  Facial Expression Animated  Affect Appropriate to circumstance  Speech Logical/coherent  Interaction Assertive  Motor Activity Other (Comment) (wnl)  Appearance/Hygiene Unremarkable  Behavior Characteristics Cooperative  Mood Pleasant  Thought Process  Coherency WDL  Content WDL  Delusions None reported or observed  Perception WDL  Hallucination None reported or observed  Judgment Impaired  Confusion None  Danger to Self  Current suicidal ideation? Denies

## 2023-03-20 NOTE — Progress Notes (Signed)
   03/20/23 0555  15 Minute Checks  Location Bedroom  Visual Appearance Calm  Behavior Composed  Sleep (Behavioral Health Patients Only)  Calculate sleep? (Click Yes once per 24 hr at 0600 safety check) Yes  Documented sleep last 24 hours 7

## 2023-03-20 NOTE — Progress Notes (Cosign Needed Addendum)
[facility] MD Progress Note  03/20/2023 4:04 PM Caitlin Ortega Caitlin Ortega  MRN:  [id number] Subjective:   Caitlin Ortega is a 37 yr old female who presented on 8/7 to [facility] with bizarre behavior and paranoia.  PPHx is significant for Anxiety, and no history of Suicide Attempts  or Psychiatric Hospitalizations.   Case was discussed in the multidisciplinary team. MAR was reviewed and patient was compliant with medications.  She did not receive any PRN's yesterday.   Psychiatric Team made the following recommendations yesterday: -No changes to medications      On interview today patient reports she slept good last night.  She reports her appetite is doing good.  She reports no SI, HI, or AVH.  She reports no Paranoia or Ideas of Reference.  She reports no issues with her medications other than some mild dizziness this morning.  Discussed potential discharge with her and who Social Work could Lobbyist.  She reports that she would be staying with her God Mother after discharge and she could be contacted.  Discussed we would not make any changes to her medications at this time.  She reports no other concerns at this time.  Principal Problem: Brief psychotic disorder (HCC) Diagnosis: Principal Problem:   Brief psychotic disorder (HCC) Active Problems:   Paranoid type delusional disorder (HCC)  Total Time spent with patient:  I personally spent 35 minutes on the unit in direct patient care. The direct patient care time included face-to-face time with the patient, reviewing the patient's chart, communicating with other professionals, and coordinating care. Greater than 50% of this time was spent in counseling or coordinating care with the patient regarding goals of hospitalization, psycho-education, and discharge planning needs.   Past Psychiatric History: Anxiety, and no history of Suicide Attempts  or Psychiatric Hospitalizations.  Past Medical History:  Past Medical History:  Diagnosis  Date   Allergy    Anemia    Asthma    Gestational diabetes     Past Surgical History:  Procedure Laterality Date   NO PAST SURGERIES     WISDOM TOOTH EXTRACTION     Family History:  Family History  Problem Relation Age of Onset   Hypertension Mother    Hypertension Sister    Heart disease Maternal Grandfather    Family Psychiatric  History: Reports None Social History:  Social History   Substance and Sexual Activity  Alcohol Use Not Currently     Social History   Substance and Sexual Activity  Drug Use Yes   Types: Marijuana    Social History   Socioeconomic History   Marital status: Single    Spouse name: Not on file   Number of children: Not on file   Years of education: Not on file   Highest education level: Not on file  Occupational History   Not on file  Tobacco Use   Smoking status: Former   Smokeless tobacco: Never  Vaping Use   Vaping status: Never Used  Substance and Sexual Activity   Alcohol use: Not Currently   Drug use: Yes    Types: Marijuana   Sexual activity: Yes    Birth control/protection: Condom  Other Topics Concern   Not on file  Social History Narrative   Pt states good support system at home   Social Determinants of Health   Financial Resource Strain: Not on file  Food Insecurity: No Food Insecurity (03/17/2023)   Hunger Vital Sign    Worried About  Running Out of Food in the Last Year: Never true    Ran Out of Food in the Last Year: Never true  Transportation Needs: No Transportation Needs (03/17/2023)   PRAPARE - Administrator, Civil Service (Medical): No    Lack of Transportation (Non-Medical): No  Physical Activity: Not on file  Stress: Not on file  Social Connections: Not on file   Additional Social History:                         Sleep: Good  Appetite:  Good  Current Medications: Current Facility-Administered Medications  Medication Dose Route Frequency Provider Last Rate Last Admin    acetaminophen (TYLENOL) tablet 650 mg  650 mg Oral Q6H PRN Lenox Ponds, NP       alum & mag hydroxide-simeth (MAALOX/MYLANTA) 200-200-20 MG/5ML suspension 30 mL  30 mL Oral Q4H PRN Lenox Ponds, NP       diphenhydrAMINE (BENADRYL) capsule 50 mg  50 mg Oral TID PRN Lenox Ponds, NP       Or   diphenhydrAMINE (BENADRYL) injection 50 mg  50 mg Intramuscular TID PRN Lenox Ponds, NP       haloperidol (HALDOL) tablet 5 mg  5 mg Oral TID PRN Lenox Ponds, NP       Or   haloperidol lactate (HALDOL) injection 5 mg  5 mg Intramuscular TID PRN Lenox Ponds, NP       LORazepam (ATIVAN) tablet 2 mg  2 mg Oral TID PRN Lenox Ponds, NP       Or   LORazepam (ATIVAN) injection 2 mg  2 mg Intramuscular TID PRN Lenox Ponds, NP       magnesium hydroxide (MILK OF MAGNESIA) suspension 30 mL  30 mL Oral Daily PRN Lenox Ponds, NP       risperiDONE (RISPERDAL) tablet 1 mg  1 mg Oral Daily Lenox Ponds, NP   1 mg at 03/20/23 0850   traZODone (DESYREL) tablet 50 mg  50 mg Oral QHS PRN Lenox Ponds, NP       traZODone (DESYREL) tablet 50 mg  50 mg Oral QHS Rex Kras, MD   50 mg at 03/19/23 2032    Lab Results: No results found for this or any previous visit (from the past 48 hour(s)).  Blood Alcohol level:  Lab Results  Component Value Date   ETH <10 03/15/2023    Metabolic Disorder Labs: Lab Results  Component Value Date   HGBA1C 5.5 03/17/2023   MPG 111.15 03/17/2023   MPG 102.54 11/10/2020   No results found for: "PROLACTIN" Lab Results  Component Value Date   CHOL 137 03/17/2023   TRIG 29 03/17/2023   HDL 48 03/17/2023   CHOLHDL 2.9 03/17/2023   VLDL 6 03/17/2023   LDLCALC 83 03/17/2023   LDLCALC 84 05/30/2022    Physical Findings: AIMS: Facial and Oral Movements Muscles of Facial Expression: None, normal Lips and Perioral Area: None, normal Jaw: None, normal Tongue: None, normal,Extremity Movements Upper (arms, wrists, hands, fingers):  None, normal Lower (legs, knees, ankles, toes): None, normal, Trunk Movements Neck, shoulders, hips: None, normal, Overall Severity Severity of abnormal movements (highest score from questions above): None, normal Incapacitation due to abnormal movements: None, normal Patient's awareness of abnormal movements (rate only patient's report): No Awareness, Dental Status Current problems with teeth and/or dentures?: No Does patient usually wear dentures?: No  CIWA:    COWS:     Musculoskeletal: Strength & Muscle Tone: within normal limits Gait & Station:  laying in bed Patient leans: N/A  Psychiatric Specialty Exam:  Presentation  General Appearance:  Appropriate for Environment; Casual  Eye Contact: Good  Speech: Clear and Coherent; Normal Rate  Speech Volume: Decreased  Handedness: Right   Mood and Affect  Mood: Dysphoric  Affect: Congruent   Thought Process  Thought Processes: Coherent; Linear  Descriptions of Associations:Intact  Orientation:Full (Time, Place and Person)  Thought Content:Logical; WDL  History of Schizophrenia/Schizoaffective disorder:No  Duration of Psychotic Symptoms:N/A  Hallucinations:Hallucinations: None  Ideas of Reference:None  Suicidal Thoughts:Suicidal Thoughts: No  Homicidal Thoughts:Homicidal Thoughts: No   Sensorium  Memory: Immediate Fair; Recent Fair  Judgment: Fair  Insight: Fair   Art therapist  Concentration: Good  Attention Span: Good  Recall: Good  Fund of Knowledge: Good  Language: Good   Psychomotor Activity  Psychomotor Activity: Psychomotor Activity: Normal   Assets  Assets: Communication Skills; Desire for Improvement; Housing; Social Support; Resilience   Sleep  Sleep: Sleep: Good Number of Hours of Sleep: 7    Physical Exam: Physical Exam Vitals and nursing note reviewed.  Constitutional:      General: She is not in acute distress.    Appearance: Normal  appearance. She is normal weight. She is not ill-appearing or toxic-appearing.  HENT:     Head: Normocephalic and atraumatic.  Pulmonary:     Effort: Pulmonary effort is normal.  Musculoskeletal:        General: Normal range of motion.  Neurological:     General: No focal deficit present.     Mental Status: She is alert.    Review of Systems  Respiratory:  Negative for cough and shortness of breath.   Cardiovascular:  Negative for chest pain.  Gastrointestinal:  Negative for abdominal pain, constipation, diarrhea, nausea and vomiting.  Neurological:  Positive for dizziness (mild in the morning). Negative for weakness and headaches.  Psychiatric/Behavioral:  Negative for depression, hallucinations and suicidal ideas. The patient is not nervous/anxious.    Blood pressure 111/67, pulse (!) 53, temperature 97.7 F (36.5 C), temperature source Oral, resp. rate 16, height 5\' 6"  (1.676 m), weight 68.2 kg, last menstrual period 03/16/2023, SpO2 100%, currently breastfeeding. Body mass index is 24.28 kg/m.   Treatment Plan Summary: Daily contact with patient to assess and evaluate symptoms and progress in treatment and Medication management  Caitlin Ortega is a 37 yr old female who presented on 8/7 to [facility] with bizarre behavior and paranoia.  PPHx is significant for Anxiety, and no history of Suicide Attempts  or Psychiatric Hospitalizations.   Caitlin Ortega appears to be responding well to the Risperdal as she is not reporting any paranoia today.  We will not make any changes to her medications at this time.  We will plan for discharge in the next day or 2.  Social Work has spoken with God Mother and done safety planning.  We will redraw BMP and CBC to follow up on labs drawn at admission.  We will continue to monitor.   Brief Psychotic Disorder: -Continue Risperdal 1 mg QHS -Continue Agitation Protocol: Haldol/Ativan/Benadryl   -Continue Trazodone 50 mg QHS for insomnia. -Continue PRN's:  Tylenol, Maalox, Atarax, Milk of Magnesia, Trazodone   Labs: On Admission- CMP: WNL except Na: 134, K: 3.0, CO2: 17,  CBC: WNL except  WBC: 12.1,  Hem: 11.5,  MCV: 75.3,  MCH: 23.7,  Lipid  Panel: WNL,  A1c: 5.5,  EKG: Sinus Bradycardia with Qtc: 431.   Caitlin Franklin, MD 03/20/2023, 4:04 PM

## 2023-03-20 NOTE — Plan of Care (Signed)
  Problem: Education: Goal: Emotional status will improve Outcome: Progressing Goal: Mental status will improve Outcome: Progressing   Problem: Activity: Goal: Interest or engagement in activities will improve Outcome: Progressing   Problem: Coping: Goal: Ability to verbalize frustrations and anger appropriately will improve Outcome: Progressing   

## 2023-03-20 NOTE — [facility] Group Notes (Signed)
Pt was encouraged but refused to attended wrap up group

## 2023-03-20 NOTE — Progress Notes (Signed)
   03/19/23 2030  Psych Admission Type (Psych Patients Only)  Admission Status Voluntary  Psychosocial Assessment  Patient Complaints None  Eye Contact Fair  Facial Expression Animated  Affect Appropriate to circumstance  Speech Logical/coherent  Interaction Assertive  Motor Activity Fidgety  Appearance/Hygiene Unremarkable  Behavior Characteristics Cooperative;Appropriate to situation  Mood Pleasant  Thought Process  Coherency WDL  Content WDL  Delusions None reported or observed  Perception WDL  Hallucination None reported or observed  Judgment Impaired  Confusion None  Danger to Self  Current suicidal ideation? Denies  Agreement Not to Harm Self Yes  Description of Agreement verbal  Danger to Others  Danger to Others None reported or observed   Pt was offered support and encouragement. Pt was given scheduled medications. Pt was encourage to attend groups. Q 15 minute checks were done for safety. Pt attended group and interacts well with peers and staff. Pt has no complaints.Pt receptive to treatment and safety maintained on unit.

## 2023-03-20 NOTE — Group Note (Signed)
LCSW Group Therapy Note  Group Date: 03/20/2023 Start Time: 1300 End Time: 1400   Type of Therapy and Topic:  Group Therapy: Anger Cues and Responses  Participation Level:  Active   Description of Group:   In this group, patients learned how to recognize the physical, cognitive, emotional, and behavioral responses they have to anger-provoking situations.  They identified a recent time they became angry and how they reacted.  They analyzed how their reaction was possibly beneficial and how it was possibly unhelpful.  The group discussed a variety of healthier coping skills that could help with such a situation in the future.  Focus was placed on how helpful it is to recognize the underlying emotions to our anger, because working on those can lead to a more permanent solution as well as our ability to focus on the important rather than the urgent.  Therapeutic Goals: Patients will remember their last incident of anger and how they felt emotionally and physically, what their thoughts were at the time, and how they behaved. Patients will identify how their behavior at that time worked for them, as well as how it worked against them. Patients will explore possible new behaviors to use in future anger situations. Patients will learn that anger itself is normal and cannot be eliminated, and that healthier reactions can assist with resolving conflict rather than worsening situations.  Summary of Patient Progress:  Patient was active during the group. Patient shared a recent occurrence wherein feeling of being taken " advantage of" due to not allowing things to led her anger. Patient then demonstrated positive insight into the subject matter, was respectful of peers, and participated throughout the entire session.  Therapeutic Modalities:   Cognitive Behavioral Therapy    Caitlin Ortega 03/20/2023  2:23 PM

## 2023-03-21 MED ORDER — RISPERIDONE 1 MG PO TABS: 1.0000 mg | ORAL_TABLET | Freq: Every day | ORAL | 0 refills | Status: AC

## 2023-03-21 NOTE — Progress Notes (Signed)
  [facility] Adult Case Management Discharge Plan :  Will you be returning to the same living situation after discharge:  Yes,  Pt will be returning to her home At discharge, do you have transportation home?: Yes,   Mariel Kansky (Friend) [phone] Do you have the ability to pay for your medications: Yes,  Insured [employer]/MEDICAID  Release of information consent forms completed and in the chart;  Patient's signature needed at discharge.  Patient to Follow up at:  Follow-up Information     [facility] Go on 03/24/2023.   Why: You have a hospital follow up appointment 03/24/23 at 9:00 am.   The appointment will be held in person.  Following the initial appointment you will be scheduled for a clinical assessment, to obtain necessary therapy and medication management services. Contact information: [address] [place] [address] [phone]                 Next level of care provider has access to [facility] Link:no  Safety Planning and Suicide Prevention discussed: Yes,   Mariel Kansky (Friend) [phone]     Has patient been referred to the Quitline?: Patient refused referral for treatment  Patient has been referred for addiction treatment: Yes, the patient will follow up with an outpatient provider for substance use disorder. Psychiatrist/APP: appointment made and Therapist: appointment made Patient to continue working towards treatment goals after discharge. Patient no longer meets criteria for inpatient criteria per attending physician. Continue taking medications as prescribed, nursing to provide instructions at discharge. Follow up with all scheduled appointments.   Tyashia Morrisette S Eustacio Ellen, LCSW 03/21/2023, 9:40 AM

## 2023-03-21 NOTE — Progress Notes (Signed)
   03/21/23 0600  15 Minute Checks  Location Bedroom  Visual Appearance Calm  Behavior Composed  Sleep (Behavioral Health Patients Only)  Calculate sleep? (Click Yes once per 24 hr at 0600 safety check) Yes  Documented sleep last 24 hours 6.75

## 2023-03-21 NOTE — Progress Notes (Signed)
Pt discharged at this time. Pt left facility accompanied by her sister. Pt removed all belongings, prescription, and verbalized understanding of medications and discharge instructions. Pt denies SI/HI/AVH.

## 2023-03-21 NOTE — [facility] Suicide Risk Assessment (Signed)
Suicide Risk Assessment  Discharge Assessment    [facility] Discharge Suicide Risk Assessment   Principal Problem: Brief psychotic disorder [facility]) Discharge Diagnoses: Principal Problem:   Brief psychotic disorder (HCC) Active Problems:   Paranoid type delusional disorder (HCC)  During the patient's hospitalization, patient had extensive initial psychiatric evaluation, and follow-up psychiatric evaluations every day.  Psychiatric diagnoses provided upon initial assessment: Paranoid Type Delusional Disorder.  This was changed to Brief Psychotic Disorder.  Patient's psychiatric medications were adjusted on admission: Started on Risperdal.  During the hospitalization, other adjustments were made to the patient's psychiatric medication regimen: No changes were made during hospitalization.  Gradually, patient started adjusting to milieu.   Patient's care was discussed during the interdisciplinary team meeting every day during the hospitalization.  The patient is not having side effects to prescribed psychiatric medication.  The patient reports their target psychiatric symptoms of paranoia and bizarre behavior responded well to the psychiatric medications, and the patient reports overall benefit other psychiatric hospitalization. Supportive psychotherapy was provided to the patient. The patient also participated in regular group therapy while admitted.   Labs were reviewed with the patient, and abnormal results were discussed with the patient.  The patient denied having suicidal thoughts more than 48 hours prior to discharge.  Patient denies having homicidal thoughts.  Patient denies having auditory hallucinations.  Patient denies any visual hallucinations.  Patient denies having paranoid thoughts.  The patient is able to verbalize their individual safety plan to this provider.  It is recommended to the patient to continue psychiatric medications as prescribed, after discharge from the hospital.     It is recommended to the patient to follow up with your outpatient psychiatric provider and PCP.  Discussed with the patient, the impact of alcohol, drugs, tobacco have been there overall psychiatric and medical wellbeing, and total abstinence from substance use was recommended the patient.  Total Time spent with patient: 20 minutes  Musculoskeletal: Strength & Muscle Tone: within normal limits Gait & Station: normal Patient leans: N/A  Psychiatric Specialty Exam  Presentation  General Appearance:  Appropriate for Environment; Casual  Eye Contact: Good  Speech: Clear and Coherent; Normal Rate  Speech Volume: Normal  Handedness: Right   Mood and Affect  Mood: -- ("good")  Duration of Depression Symptoms: No data recorded Affect: Congruent; Appropriate   Thought Process  Thought Processes: Coherent; Goal Directed  Descriptions of Associations:Intact  Orientation:Full (Time, Place and Person)  Thought Content:Logical; WDL  History of Schizophrenia/Schizoaffective disorder:No  Duration of Psychotic Symptoms:N/A  Hallucinations:Hallucinations: None  Ideas of Reference:None  Suicidal Thoughts:Suicidal Thoughts: No  Homicidal Thoughts:Homicidal Thoughts: No   Sensorium  Memory: Immediate Fair; Recent Fair  Judgment: Good  Insight: Good   Executive Functions  Concentration: Good  Attention Span: Good  Recall: Good  Fund of Knowledge: Good  Language: Good   Psychomotor Activity  Psychomotor Activity: Psychomotor Activity: Normal   Assets  Assets: Communication Skills; Desire for Improvement; Resilience; Social Support; Housing   Sleep  Sleep: Sleep: Good Number of Hours of Sleep: 6.75   Physical Exam: Physical Exam Vitals and nursing note reviewed.  Constitutional:      General: She is not in acute distress.    Appearance: Normal appearance. She is normal weight. She is not ill-appearing or toxic-appearing.   HENT:     Head: Normocephalic and atraumatic.  Pulmonary:     Effort: Pulmonary effort is normal.  Musculoskeletal:        General: Normal range of  motion.  Neurological:     General: No focal deficit present.     Mental Status: She is alert.    Review of Systems  Respiratory:  Negative for cough and shortness of breath.   Cardiovascular:  Negative for chest pain.  Gastrointestinal:  Negative for abdominal pain, constipation, diarrhea, nausea and vomiting.  Neurological:  Negative for dizziness, weakness and headaches.  Psychiatric/Behavioral:  Negative for depression, hallucinations and suicidal ideas. The patient is not nervous/anxious.    Blood pressure 105/70, pulse 71, temperature 98.4 F (36.9 C), temperature source Oral, resp. rate 16, height 5\' 6"  (1.676 m), weight 68.2 kg, last menstrual period 03/16/2023, SpO2 100%, currently breastfeeding. Body mass index is 24.28 kg/m.  Mental Status Per Nursing Assessment::   On Admission:  NA  Demographic Factors:  Living alone  Loss Factors: NA  Historical Factors: NA  Risk Reduction Factors:   Responsible for children under 79 years of age, Employed, Living with another person, especially a relative, and Positive social support  Continued Clinical Symptoms:  None  Cognitive Features That Contribute To Risk:  None    Suicide Risk:  Mild:  Suicidal ideation of limited frequency, intensity, duration, and specificity.  There are no identifiable plans, no associated intent, mild dysphoria and related symptoms, good self-control (both objective and subjective assessment), few other risk factors, and identifiable protective factors, including available and accessible social support.   Follow-up Information     [facility] Go on 03/24/2023.   Why: You have a hospital follow up appointment 03/24/23 at 9:00 am.   The appointment will be held in person.  Following the initial appointment you will be scheduled for a  clinical assessment, to obtain necessary therapy and medication management services. Contact information: [address] [place] [address] [phone]                 Plan Of Care/Follow-up recommendations:  Activity: as tolerated  Diet: heart healthy  Other: -Follow-up with your outpatient psychiatric provider -instructions on appointment date, time, and address (location) are provided to you in discharge paperwork.  -Take your psychiatric medications as prescribed at discharge - instructions are provided to you in the discharge paperwork  -Follow-up with outpatient primary care doctor and other specialists -for management of chronic medical disease, including: Routine Health Maintenance  -Testing: Follow-up with outpatient provider for abnormal lab results: None  -Recommend abstinence from alcohol, tobacco, and other illicit drug use at discharge.   -If your psychiatric symptoms recur, worsen, or if you have side effects to your psychiatric medications, call your outpatient psychiatric provider, [phone] or go to the nearest emergency department.  -If suicidal thoughts recur, call your outpatient psychiatric provider, 911, [phone] or go to the nearest emergency department.   Lauro Franklin, MD 03/21/2023, 9:17 AM
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
## 2023-03-21 NOTE — Plan of Care (Signed)
  Problem: Education: Goal: Emotional status will improve Outcome: Progressing Goal: Mental status will improve Outcome: Progressing   Problem: Coping: Goal: Ability to verbalize frustrations and anger appropriately will improve Outcome: Progressing   Problem: Safety: Goal: Periods of time without injury will increase Outcome: Progressing

## 2023-03-21 NOTE — Progress Notes (Signed)
   03/20/23 2110  Psych Admission Type (Psych Patients Only)  Admission Status Voluntary  Psychosocial Assessment  Patient Complaints None  Eye Contact Fair  Facial Expression Animated  Affect Appropriate to circumstance  Speech Logical/coherent  Interaction Minimal  Motor Activity Other (Comment) (WNL)  Appearance/Hygiene Unremarkable  Behavior Characteristics Cooperative;Appropriate to situation  Mood Pleasant  Thought Process  Coherency WDL  Content WDL  Delusions None reported or observed  Perception WDL  Hallucination None reported or observed  Judgment Impaired  Confusion None  Danger to Self  Current suicidal ideation? Denies  Agreement Not to Harm Self Yes  Description of Agreement verbal  Danger to Others  Danger to Others None reported or observed   Pt remained in bedroom for majority of shift. Pt was offered support and encouragement. Pt refused scheduled Trazodone, reported that she has been sleeping okay without the medication. Pt was encouraged to attend group. Pt did not attend group. Minimally interactive with staff and peers. Q 15 minute checks were done for safety.  Pt has no complaints.Pt receptive to treatment and safety maintained on unit.

## 2023-03-28 ENCOUNTER — Telehealth: Payer: Self-pay | Admitting: Family Medicine

## 2023-03-28 NOTE — Telephone Encounter (Signed)
error 

## 2023-05-01 ENCOUNTER — Telehealth: Payer: Self-pay

## 2023-05-01 NOTE — Telephone Encounter (Signed)
Medicaid Managed Care   Unsuccessful Outreach Note  05/01/2023 Name: Caitlin Ortega MRN: [id number] DOB: 1986-04-14  Referred by: Dorcas Carrow, DO Reason for referral : No chief complaint on file.   An unsuccessful telephone outreach was attempted today. The patient was referred to the case management team for assistance with care management and care coordination.   Follow Up Plan: If patient returns call to provider office, please advise to call Embedded Care Management Care Guide Nicholes Rough* at [phone]*  Nicholes Rough, CMA Care Guide [employer]

## 2023-06-01 ENCOUNTER — Encounter: Payer: Medicaid Other | Admitting: Family Medicine

## 2023-07-23 ENCOUNTER — Encounter: Payer: Self-pay | Admitting: Emergency Medicine

## 2023-07-23 ENCOUNTER — Other Ambulatory Visit: Payer: Self-pay

## 2023-07-23 ENCOUNTER — Emergency Department
Admission: EM | Admit: 2023-07-23 | Discharge: 2023-07-25 | Disposition: A | Discharge status: Other Acute Inpatient Hospital | Payer: Medicaid Other | Attending: Emergency Medicine | Admitting: Emergency Medicine

## 2023-07-23 DIAGNOSIS — F29 Unspecified psychosis not due to a substance or known physiological condition: Secondary | ICD-10-CM | POA: Insufficient documentation

## 2023-07-23 DIAGNOSIS — J45909 Unspecified asthma, uncomplicated: Secondary | ICD-10-CM | POA: Insufficient documentation

## 2023-07-23 DIAGNOSIS — Z046 Encounter for general psychiatric examination, requested by authority: Secondary | ICD-10-CM

## 2023-07-23 HISTORY — DX: Schizophrenia, unspecified: F20.9

## 2023-07-23 LAB — CBC
HCT: 36.6 % (ref 36.0–46.0)
Hemoglobin: 11.6 g/dL — ABNORMAL LOW (ref 12.0–15.0)
MCH: 24.7 pg — ABNORMAL LOW (ref 26.0–34.0)
MCHC: 31.7 g/dL (ref 30.0–36.0)
MCV: 77.9 fL — ABNORMAL LOW (ref 80.0–100.0)
Platelets: 364 10*3/uL (ref 150–400)
RBC: 4.7 MIL/uL (ref 3.87–5.11)
RDW: 14.7 % (ref 11.5–15.5)
WBC: 9.1 10*3/uL (ref 4.0–10.5)
nRBC: 0 % (ref 0.0–0.2)

## 2023-07-23 LAB — URINE DRUG SCREEN, QUALITATIVE (ARMC ONLY)
Amphetamines, Ur Screen: NOT DETECTED
Barbiturates, Ur Screen: NOT DETECTED
Benzodiazepine, Ur Scrn: NOT DETECTED
Cannabinoid 50 Ng, Ur ~~LOC~~: NOT DETECTED
Cocaine Metabolite,Ur ~~LOC~~: NOT DETECTED
MDMA (Ecstasy)Ur Screen: NOT DETECTED
Methadone Scn, Ur: NOT DETECTED
Opiate, Ur Screen: NOT DETECTED
Phencyclidine (PCP) Ur S: NOT DETECTED
Tricyclic, Ur Screen: NOT DETECTED

## 2023-07-23 LAB — COMPREHENSIVE METABOLIC PANEL
ALT: 16 U/L (ref 0–44)
AST: 22 U/L (ref 15–41)
Albumin: 4.9 g/dL (ref 3.5–5.0)
Alkaline Phosphatase: 52 U/L (ref 38–126)
Anion gap: 8 (ref 5–15)
BUN: 9 mg/dL (ref 6–20)
CO2: 25 mmol/L (ref 22–32)
Calcium: 9.4 mg/dL (ref 8.9–10.3)
Chloride: 105 mmol/L (ref 98–111)
Creatinine, Ser: 0.59 mg/dL (ref 0.44–1.00)
GFR, Estimated: >60 mL/min (ref >60)
Glucose, Bld: 108 mg/dL — ABNORMAL HIGH (ref 70–99)
Potassium: 3.2 mmol/L — ABNORMAL LOW (ref 3.5–5.1)
Sodium: 138 mmol/L (ref 135–145)
Total Bilirubin: 1.1 mg/dL (ref <1.2)
Total Protein: 8.5 g/dL — ABNORMAL HIGH (ref 6.5–8.1)

## 2023-07-23 LAB — ACETAMINOPHEN LEVEL: Acetaminophen (Tylenol), Serum: <10 ug/mL — ABNORMAL LOW (ref 10–30)

## 2023-07-23 LAB — SALICYLATE LEVEL: Salicylate Lvl: <7 mg/dL — ABNORMAL LOW (ref 7.0–30.0)

## 2023-07-23 LAB — ETHANOL: Alcohol, Ethyl (B): <10 mg/dL (ref <10)

## 2023-07-23 LAB — POC URINE PREG, ED: Preg Test, Ur: NEGATIVE

## 2023-07-23 MED ORDER — POTASSIUM CHLORIDE CRYS ER 20 MEQ PO TBCR
20.0000 meq | EXTENDED_RELEASE_TABLET | Freq: Once | ORAL | Status: AC
  Administered 2023-07-23: 20 meq via ORAL
  Filled 2023-07-23: qty 1

## 2023-07-23 NOTE — ED Provider Notes (Signed)
[facility] Provider Note    None    (approximate)   History   Involuntary commitment  HPI  Caitlin Ortega is a 37 y.o. female with history of prior brief psychotic disorder in August  Patient is here under IVC.  She is very calm, she relates a situation where she woke up this morning in her bedroom window was open.  This caused her alarm as she knew she had closed it, she felt an intruder may be in the home.  She describes that she went outside was telling her daughter to come outside quite frantically because she thought and treatment might still be in the home.  The police were called earlier, but then she relates that the police had to come back at this time and she was placed under IVC  IVC paperwork denotes that the patient was causing concern and is untreated with a history of 'bipolar schizophrenia'  Patient does report she suffers anxiety and takes hydroxyzine.  Denies illicit drug use.  Does not want to harm herself or anyone else.  She advises she was very concerned that someone had broke into the house and was trying to get her daughter out frantically which caused her daughter to get scared, and eventually led to this evaluation     Physical Exam   Triage Vital Signs: ED Triage Vitals  Encounter Vitals Group     BP 07/23/23 1249 (!) 152/104     Systolic BP Percentile --      Diastolic BP Percentile --      Pulse Rate 07/23/23 1249 83     Resp 07/23/23 1249 16     Temp 07/23/23 1249 98.9 F (37.2 C)     Temp Source 07/23/23 1249 Oral     SpO2 07/23/23 1249 100 %     Weight 07/23/23 1250 150 lb 5.7 oz (68.2 kg)     Height --      Head Circumference --      Peak Flow --      Pain Score 07/23/23 1250 0     Pain Loc --      Pain Education --      Exclude from Growth Chart --     Most recent vital signs: Vitals:   07/23/23 1249  BP: (!) 152/104  Pulse: 83  Resp: 16  Temp: 98.9 F (37.2 C)  SpO2: 100%     General: Awake, no  distress.  CV:  Good peripheral perfusion.  Normal tones and rate Resp:  Normal effort.  Abd:  No distention.  Other:  She is calm.  Fully oriented.  Does not desire to harm herself or anyone else.  At times she seems slightly pressured in her speech   ED Results / Procedures / Treatments   Labs (all labs ordered are listed, but only abnormal results are displayed) Labs Reviewed  COMPREHENSIVE METABOLIC PANEL - Abnormal; Notable for the following components:      Result Value   Potassium 3.2 (*)    Glucose, Bld 108 (*)    Total Protein 8.5 (*)    All other components within normal limits  SALICYLATE LEVEL - Abnormal; Notable for the following components:   Salicylate Lvl <7.0 (*)    All other components within normal limits  ACETAMINOPHEN LEVEL - Abnormal; Notable for the following components:   Acetaminophen (Tylenol), Serum <10 (*)    All other components within normal limits  CBC - Abnormal; Notable  for the following components:   Hemoglobin 11.6 (*)    MCV 77.9 (*)    MCH 24.7 (*)    All other components within normal limits  ETHANOL  URINE DRUG SCREEN, QUALITATIVE ([facility] ONLY)  POC URINE PREG, ED   Psychiatric screening labs reviewed very mild anemia.  Mild hypokalemia PROCEDURES:  Critical Care performed: No  Procedures   MEDICATIONS ORDERED IN ED: Medications  potassium chloride SA (KLOR-CON M) CR tablet 20 mEq (has no administration in time range)     IMPRESSION / MDM / ASSESSMENT AND PLAN / ED COURSE  I reviewed the triage vital signs and the nursing notes.                              Differential diagnosis includes, but is not limited to, possible psychoses, paranoia, delusion, or perhaps just a misunderstanding of the events that occurred today.  She does not have any acute medical complaint her vital signs are reassuring.  She is alert well-oriented does not desire to harm herself or others but is under involuntary commitment.  I will place a consult to  psychiatry, in her case it is somewhat hard for me to discern but I think additional information gathering by our TTS and psychiatry team will be helpful in understanding the events of today and whether the patient may be suffering from a somewhat acute delusion or psychoses.  She does not have any symptoms that would suggest an acute metabolic central neurologic or medical pathology.  She does have a history of some psychiatric hospitalization and psychiatric diagnoses from August also noted  Patient's presentation is most consistent with acute complicated illness / injury requiring diagnostic workup.   ----------------------------------------- 2:36 PM on 07/23/2023 ----------------------------------------- Patient is medically clear for psychiatric consult at this time       FINAL CLINICAL IMPRESSION(S) / ED DIAGNOSES   Final diagnoses:  Involuntary commitment     Rx / DC Orders   ED Discharge Orders     None        Note:  This document was prepared using Dragon voice recognition software and may include unintentional dictation errors.   Sharyn Creamer, MD 07/23/23 [phone]

## 2023-07-23 NOTE — ED Triage Notes (Signed)
Pt is currently on her menstrual cycle. Supplies where given as she waits for a room

## 2023-07-23 NOTE — ED Triage Notes (Addendum)
Pt willing dressed out . Pt belongings : blue jeans , blue sweater, pink sneakers, silver stud earrings , license , [employer] member card , debt card. Pt also has an iphone

## 2023-07-23 NOTE — ED Notes (Signed)
This tech obtained vital signs on pt.  

## 2023-07-23 NOTE — BH Assessment (Addendum)
Comprehensive Clinical Assessment (CCA) Note  07/23/2023 Caitlin Ortega [id number]  Chief Complaint: No chief complaint on file.  Visit Diagnosis: Psychotic Disorder   Discussed patient with Psychiatric Nurse Practitioner Fredna Dow S.), patient recommended for inpatient treatment.  HQIONG M. Ortega is a 37 year old female who presents to the ER via law enforcement, under IVC. Per the report of the patient, she doesn't know why she was brought to the ER. However, she does know it was because of her mother petitioned for her to be "court ordered." Throughout the interview, she denied SI/HI and AV/H. However, according to the IVC, the patient's daughter contacted the patient's mother because she was afraid. She hided in the bathroom and the patient broke the window. She was responding to internal stimuli, yelling and running through the house. The patient's mother and sister report they noticed a change in her behaviors approximately two to three years ago and thought it was due to postpartum. She has a two-year-old child and the oldest is eleven. She was depressed and isolated from them. However, they are now hearing more about her behaviors and witnessing them for themselves. The patient has told them in the recent past the universe tell her to do things, and she receive messages from rocks and other objects. Recently she told her two children they were moving to [place]. The same morning, she said it, she rode a train to [place] with no plans, with only the clothes they were wearing. They returned home the same day because they didn't have anywhere to live. It's unclear whether or not the patient is taking her medications and the oldest child has told the grandmother, she doesn't like staying at home, because her mother (patient) is crazy.  CCA Screening, Triage and Referral (STR)  Patient Reported Information How did you hear about [place]? Family/Friend  What Is the Reason for Your Visit/Call Today?  Patient family having concerns about her current state of her mental health.  How Long Has This Been Causing You Problems? > than 6 months  What Do You Feel Would Help You the Most Today? Treatment for Depression or other mood problem; Alcohol or Drug Use Treatment   Have You Recently Had Any Thoughts About Hurting Yourself? No  Are You Planning to Commit Suicide/Harm Yourself At This time? No   Flowsheet Row ED from 07/23/2023 in [facility] Emergency Department at [facility] Admission (Discharged) from 03/16/2023 in [facility] INPATIENT AD[facility] ED from 03/15/2023 in [facility] Emergency Department at [facility]  C-SSRS RISK CATEGORY No Risk No Risk No Risk       Have you Recently Had Thoughts About Hurting Someone Caitlin Ortega? No  Are You Planning to Harm Someone at This Time? No  Explanation: No SI or HI.   Have You Used Any Alcohol or Drugs in the Past 24 Hours? No  What Did You Use and How Much? Pt denies use.   Do You Currently Have a Therapist/Psychiatrist? Yes  Name of Therapist/Psychiatrist:    Have You Been Recently Discharged From Any Office Practice or Programs? No  Explanation of Discharge From Practice/Program: No recent discharges     CCA Screening Triage Referral Assessment Type of Contact: Face-to-Face  Telemedicine Service Delivery:   Is this Initial or Reassessment?   Date Telepsych consult ordered in [facility]:    Time Telepsych consult ordered in [facility]:    Location of Assessment: [facility] ED  Provider Location: [facility] ED   Collateral Involvement: Pt refuses  Does Patient Have a Automotive engineer Guardian? No  Legal Guardian Contact Information: Pt has no legal guardian  Copy of Legal Guardianship Form: -- (Pt has no legal guardian)  Legal Guardian Notified of Arrival: -- (Pt has no legal guardian)  Legal Guardian Notified of Pending Discharge: -- (Pt has no legal guardian)  If Minor and Not Living with Parent(s), Who  has Custody? Pt is a adult  Is CPS involved or ever been involved? Never  Is APS involved or ever been involved? Never   Patient Determined To Be At Risk for Harm To Self or Others Based on Review of Patient Reported Information or Presenting Complaint? Yes, for Harm to Others  Method: No Plan  Availability of Means: No access or NA  Intent: Vague intent or NA  Notification Required: No need or identified person  Additional Information for Danger to Others Potential: Family history of violence  Additional Comments for Danger to Others Potential: no danger to others, no HI.  Are There Guns or Other Weapons in Your Home? No  Types of Guns/Weapons: Denies having access to guns.  Are These Weapons Safely Secured?                            No  Who Could Verify You Are Able To Have These Secured: No one  Do You Have any Outstanding Charges, Pending Court Dates, Parole/Probation? NOne  Contacted To Inform of Risk of Harm To Self or Others: Other: Comment (Patient has no HI.)    Does Patient Present under Involuntary Commitment? Yes    [place] of Residence: [place]   Patient Currently Receiving the Following Services: Medication Management   Determination of Need: Emergent (2 hours)   Options For Referral: ED Visit; Inpatient Hospitalization     CCA Biopsychosocial Patient Reported Schizophrenia/Schizoaffective Diagnosis in Past: Yes   Strengths: Have a support system, stable housing and polite.   Mental Health Symptoms Depression:  Change in energy/activity   Duration of Depressive symptoms: Duration of Depressive Symptoms: Greater than two weeks   Mania:  Increased Energy; Overconfidence; Racing thoughts; Recklessness (Per the report of patient's family.)   Anxiety:   Restlessness   Psychosis:  Other negative symptoms   Duration of Psychotic symptoms: Duration of Psychotic Symptoms: Greater than six months   Trauma:  N/A   Obsessions:  N/A    Compulsions:  N/A   Inattention:  N/A   Hyperactivity/Impulsivity:  N/A   Oppositional/Defiant Behaviors:  N/A   Emotional Irregularity:  N/A   Other Mood/Personality Symptoms:  Generalized Anxiety D/O    Mental Status Exam Appearance and self-care  Stature:  Average   Weight:  Average weight   Clothing:  Neat/clean; Age-appropriate   Grooming:  Normal   Cosmetic use:  Age appropriate   Posture/gait:  Normal   Motor activity:  -- (Within normal range.)   Sensorium  Attention:  Normal   Concentration:  Normal   Orientation:  X5   Recall/memory:  Normal   Affect and Mood  Affect:  Appropriate   Mood:  Anxious   Relating  Eye contact:  Normal   Facial expression:  Anxious   Attitude toward examiner:  Cooperative   Thought and Language  Speech flow: Clear and Coherent   Thought content:  Appropriate to Mood and Circumstances   Preoccupation:  None   Hallucinations:  None   Organization:  Goal-directed   [employer]  of Knowledge:  Fair   Intelligence:  Average   Abstraction:  Functional   Judgement:  Impaired   Reality Testing:  Adequate   Insight:  Poor   Decision Making:  Impulsive   Social Functioning  Social Maturity:  Impulsive   Social Judgement:  Heedless; "Street Smart"; Naive   Stress  Stressors:  Other (Comment)   Coping Ability:  Exhausted   Skill Deficits:  Responsibility; Decision making   Supports:  Family     Religion: Religion/Spirituality Are You A Religious Person?: No  Leisure/Recreation: Leisure / Recreation Do You Have Hobbies?: No  Exercise/Diet: Exercise/Diet Do You Exercise?: No Have You Gained or Lost A Significant Amount of Weight in the Past Six Months?: No Do You Follow a Special Diet?: No Do You Have Any Trouble Sleeping?: No   CCA Employment/Education Employment/Work Situation: Employment / Work Situation Has Patient ever Been in Equities trader?:  No  Education: Education Is Patient Currently Attending School?: No Did You Have An Individualized Education Program (IIEP): No Did You Have Any Difficulty At [employer]?: No Patient's Education Has Been Impacted by Current Illness: No   CCA Family/Childhood History Family and Relationship History: Family history Marital status: Single Does patient have children?: Yes How many children?: 2 How is patient's relationship with their children?: States she has a good relationship with her children.  Childhood History:  Childhood History By whom was/is the patient raised?: Mother Did patient suffer any verbal/emotional/physical/sexual abuse as a child?: No Did patient suffer from severe childhood neglect?: No Has patient ever been sexually abused/assaulted/raped as an adolescent or adult?: No Was the patient ever a victim of a crime or a disaster?: No Witnessed domestic violence?: No Has patient been affected by domestic violence as an adult?: No   CCA Substance Use Alcohol/Drug Use: Alcohol / Drug Use Pain Medications: See MAR Prescriptions: See MAR Over the Counter: See MAR History of alcohol / drug use?: No history of alcohol / drug abuse Longest period of sobriety (when/how long): n/a      ASAM's:  Six Dimensions of Multidimensional Assessment  Dimension 1:  Acute Intoxication and/or Withdrawal Potential:      Dimension 2:  Biomedical Conditions and Complications:      Dimension 3:  Emotional, Behavioral, or Cognitive Conditions and Complications:     Dimension 4:  Readiness to Change:     Dimension 5:  Relapse, Continued use, or Continued Problem Potential:     Dimension 6:  Recovery/Living Environment:     ASAM Severity Score:    ASAM Recommended Level of Treatment:     Substance use Disorder (SUD)   Recommendations for Services/Supports/Treatments:   Discharge Disposition:   DSM5 Diagnoses: Patient Active Problem List   Diagnosis Date Noted   Brief  psychotic disorder (HCC) 03/20/2023   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   Cannabis use disorder, mild, abuse 03/16/2023   Anxiety 07/19/2022   Vitamin D deficiency 06/24/2022   Fibroid 06/20/2022   Rotator cuff impingement syndrome, left 06/07/2022   Biceps tendinitis, left 06/07/2022   Cervical paraspinal muscle spasm 06/07/2022   Left arm pain 05/30/2022   Asthma 10/01/2020    Referrals to Alternative Service(s): Referred to Alternative Service(s):   Place:   Date:   Time:    Referred to Alternative Service(s):   Place:   Date:   Time:    Referred to Alternative Service(s):   Place:   Date:   Time:    Referred to Alternative  Service(s):   Place:   Date:   Time:     Lilyan Gilford MS, LCAS, [facility], [facility] Therapeutic Triage Specialist 07/23/2023 6:23 PM

## 2023-07-23 NOTE — BH Assessment (Signed)
Per [facility] Richelle Ito S.), patient to be referred out of system.  Referral information for Psychiatric Hospitalization faxed to;   [facility] [phone]- [phone]), no appropriate bed.  [facility] [phone]),   [address] [phone]),  [place] [phone], [phone], [phone] or [phone]),   [place] [phone]--- [phone]--- [phone]--- [phone])  [phone]),   [facility] [phone] -or- [phone]),   [facility] [phone] phone-- [phone])  [facility] [phone]),  [place] [phone])  [facility] [phone]).  [facility] [phone]).

## 2023-07-23 NOTE — ED Notes (Signed)
Hospital meal provided, pt tolerated w/o complaints.  Waste discarded appropriately.  

## 2023-07-23 NOTE — BH Assessment (Signed)
Writer called and left a HIPPA Compliant message with patient's mother Allyzon Dubbs [phone]), requesting a return phone call.

## 2023-07-23 NOTE — ED Notes (Signed)
Pt given snack. 

## 2023-07-23 NOTE — BH Assessment (Signed)
At 6:35pm, TTS attempted to file a CPS report with [facility] DSS. He spoke with [employer] [phone]) and left a message with the dispatcher to have DSS on-call social worker to contact him.

## 2023-07-23 NOTE — ED Notes (Signed)
Patient belongings:  Pair pink sneakers Jeans [employer] Bra Blue sweatshirt 2 silver colored ball earring studs Nose ring in left nare, unable to remove

## 2023-07-23 NOTE — ED Notes (Signed)
Patient is IVC pending consult °

## 2023-07-23 NOTE — ED Triage Notes (Signed)
Arrives with [employer].  Patient states she called the police today because there had been a break in today, early this morning.  Patient states she was trying to get her kids out of the house.  States her daughter did not know what was going on and daughter went into the bathroom, locked herself in and called patient's mother.  Patient states she woke up and saw her window cracked open. States she was scared that someone was in the house.   AAOx3.  Skin warm and dry. NAD.  Denies hallucinations. Denies SI/HI. States she has been taking her medications.  Also reports has been seeing her therapist.

## 2023-07-23 NOTE — BH Assessment (Signed)
Received return phone call from DSS Maralyn Sago), submitted CPS report.

## 2023-07-23 NOTE — ED Notes (Signed)
Pt calm, cooperative in room quietly watching television

## 2023-07-23 NOTE — ED Notes (Signed)
Pt. Transferred to [facility] , room# 4 from ED .Patient was screened by security before entering the unit. Received Report and Recommendations from Erskine Squibb, [place] . Pt. Oriented to unit including Q15 minute rounding as well as locked bathroom protocol, meal / snack schedule, and  the security cameras in place for their protection. Patient is A/O x 4, warm / dry.  Showing no acute signs of distress. Staff to monitor as ordered

## 2023-07-24 DIAGNOSIS — F29 Unspecified psychosis not due to a substance or known physiological condition: Secondary | ICD-10-CM | POA: Diagnosis not present

## 2023-07-24 MED ORDER — RISPERIDONE 0.25 MG PO TABS
0.5000 mg | ORAL_TABLET | Freq: Two times a day (BID) | ORAL | Status: DC
Start: 2023-07-24 — End: 2023-07-25
  Administered 2023-07-24 (×2): 0.5 mg via ORAL
  Filled 2023-07-24 (×2): qty 2

## 2023-07-24 NOTE — ED Provider Notes (Signed)
Emergency Medicine Observation Re-evaluation Note  Caitlin Ortega is a 37 y.o. female, seen on rounds today.  Pt initially presented to the ED for complaints of No chief complaint on file.  Currently, the patient is calm, no acute complaints.  Physical Exam  Blood pressure 125/74, pulse 64, temperature 98.4 F (36.9 C), temperature source Oral, resp. rate 17, weight 68.2 kg, last menstrual period 07/23/2023, SpO2 99%, currently breastfeeding. Physical Exam General: NAD Lungs: CTAB Psych: not agitated  ED Course / MDM  EKG:    I have reviewed the labs performed to date as well as medications administered while in observation.  Recent changes in the last 24 hours include no acute events overnight.    Plan  Current plan is for psych eval. Patient is under full IVC at this time.   Sharman Cheek, MD 07/24/23 [phone]

## 2023-07-24 NOTE — ED Notes (Signed)
Ivc prior to arrival.

## 2023-07-24 NOTE — BH Assessment (Signed)
Patient has been accepted to [facility].  Accepting physician is Dr. Sherrian Divers.  Call report to [phone].  Representative was [employer].   ER Staff is aware of it:  Adair Laundry, ER Secretary  Dr. Roxan Hockey, ER MD  Pattricia Boss, Patient's Nurse     Patient can arrive at facility 07/25/23 after 10 AM.

## 2023-07-24 NOTE — BH Assessment (Signed)
Writer spoke to [facility] at [place] [phone] to confirm admission on tomm 07/25/23. Verlon Au states patient is first on wait list; however they are unable to state what time bed would be available.

## 2023-07-24 NOTE — ED Notes (Signed)
Pt received dinner and water .

## 2023-07-24 NOTE — ED Notes (Signed)
Pt standing in shower staring at ceiling. Requested be to put her clothes on and come out of shower. Pt verbalizes understanding.

## 2023-07-24 NOTE — ED Notes (Addendum)
Caitlin Ortega with [place] DSS, CPS at bedside to do an evaluation. Phone number [phone]

## 2023-07-24 NOTE — ED Notes (Signed)
Agreeable to take a shower, but states not at this time as she is still resting. Encouraged patient to get up and change clothes, states not at this time.

## 2023-07-24 NOTE — ED Notes (Signed)
IVC  GOING TO  [facility]  ON 07/25/23

## 2023-07-24 NOTE — ED Notes (Signed)
This tech asked pt if they wanted a snack, but the pt said no.
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
## 2023-07-25 NOTE — ED Notes (Signed)
Pt up to restroom with steady gait.

## 2023-07-25 NOTE — ED Provider Notes (Signed)
Emergency Medicine Observation Re-evaluation Note  Caitlin Ortega is a 37 y.o. female, awaiting psych dispo  Physical Exam  BP 134/64 (BP Location: Left Arm)   Pulse 80   Temp 98.9 F (37.2 C) (Oral)   Resp (!) 24   Wt 68.2 kg   LMP 07/23/2023   SpO2 100%   BMI 24.27 kg/m  Physical Exam General: resting calmly   ED Course / MDM  No new labs in past 24 hours  Plan  Current plan is for psych dispo.    Phineas Semen, MD 07/25/23 [phone]

## 2023-07-25 NOTE — ED Notes (Signed)
IVC/ Accepted to [facility] for today 07/25/23 will be transport via [facility]

## 2023-07-25 NOTE — ED Notes (Signed)
Spoke to [employer]" @ [place] to give report, she asked that we go ahead and call transport

## 2023-07-25 NOTE — ED Notes (Signed)
[facility]  dept  called  for transport  to  [facility]

## 2023-07-25 NOTE — ED Notes (Signed)
Pt is A/Ox 3, Caitlin Ortega declines any SI/HI stated that she is not currently having A/V hallucinations, however appears to be responding at times.  Discharge instructions reviewed with PT, tshe verbalized understanding.  All Belongings accounted for and given to [facility] for safe transport  Pt left ambulatory via [facility]

## 2024-04-18 ENCOUNTER — Ambulatory Visit: Payer: Self-pay

## 2024-04-18 NOTE — Telephone Encounter (Signed)
 Please reach out to patient and schedule appointment for evaluation.

## 2024-04-18 NOTE — Telephone Encounter (Signed)
 FYI Only or Action Required?: Action required by provider: request for appointment.  Patient was last seen in primary care on 10/20/2022 by Vicci Duwaine SQUIBB, DO.  Called Nurse Triage reporting Dizziness.  Symptoms began several months ago.  Symptoms are: gradually worsening.  Triage Disposition: See PCP When Office is Open (Within 3 Days)  Patient/caregiver understands and will follow disposition?: Yes, but only want's to see Dr. Vicci and cannot come in for available appointment 9/12      Copied from CRM #[id number]. Topic: Clinical - Red Word Triage >> Apr 18, 2024  4:15 PM Turkey B wrote: [facility] that prompted transfer to Nurse Triage: patient passed out last week, says getting worst, with head issues, patient says can't multitask at work     Reason for Disposition  [1] MODERATE dizziness (e.g., interferes with normal activities) AND [2] has been evaluated by doctor (or NP/PA) for this  Answer Assessment - Initial Assessment Questions Patient requesting to only see Dr. Vicci, but is unable to come in tomorrow for an appointment. Next available is 10/16. Patient would like to see if there is any way to be seen before then. Please advise.      1. DESCRIPTION: Describe your dizziness.     I feel like I'm in a bubble I can't get out of  2. LIGHTHEADED: Do you feel lightheaded? (e.g., somewhat faint, woozy, weak upon standing)     Yes 3. VERTIGO: Do you feel like either you or the room is spinning or tilting? (i.e., vertigo)     No 4. SEVERITY: How bad is it?  Do you feel like you are going to faint? Can you stand and walk?     Having difficulty functioning  5. ONSET:  When did the dizziness begin?     A few months ago  6. AGGRAVATING FACTORS: Does anything make it worse? (e.g., standing, change in head position)     No 7. HEART RATE: Can you tell me your heart rate? How many beats in 15 seconds?  (Note: Not all patients can do this.)       No 8.  CAUSE: What do you think is causing the dizziness? (e.g., decreased fluids or food, diarrhea, emotional distress, heat exposure, new medicine, sudden standing, vomiting; unknown)     Unsure  9. RECURRENT SYMPTOM: Have you had dizziness before? If Yes, ask: When was the last time? What happened that time?     No 10. OTHER SYMPTOMS: Do you have any other symptoms? (e.g., fever, chest pain, vomiting, diarrhea, bleeding)       I've been seeing things in my eyesight when I'm talking about it  Protocols used: Dizziness - Lightheadedness-A-AH

## 2024-04-18 NOTE — Telephone Encounter (Signed)
 Scheduled with Dr Herold. Pt needed an afternoon appt.

## 2024-04-19 ENCOUNTER — Ambulatory Visit: Payer: MEDICAID | Admitting: Pediatrics

## 2024-06-04 ENCOUNTER — Ambulatory Visit: Payer: Self-pay
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
## 2024-06-10 ENCOUNTER — Ambulatory Visit: Payer: MEDICAID | Admitting: Family Medicine

## 2024-06-20 ENCOUNTER — Emergency Department
Admission: EM | Admit: 2024-06-20 | Discharge: 2024-06-20 | Disposition: A | Discharge status: Home / Self Care | Payer: MEDICAID | Attending: Emergency Medicine | Admitting: Emergency Medicine

## 2024-06-20 ENCOUNTER — Other Ambulatory Visit: Payer: Self-pay

## 2024-06-20 DIAGNOSIS — G44029 Chronic cluster headache, not intractable: Secondary | ICD-10-CM | POA: Diagnosis not present

## 2024-06-20 DIAGNOSIS — G44209 Tension-type headache, unspecified, not intractable: Secondary | ICD-10-CM

## 2024-06-20 DIAGNOSIS — R519 Headache, unspecified: Secondary | ICD-10-CM

## 2024-06-20 HISTORY — DX: Benign intracranial hypertension: G93.2

## 2024-06-20 MED ORDER — IBUPROFEN 600 MG PO TABS
600.0000 mg | ORAL_TABLET | Freq: Once | ORAL | Status: AC
  Administered 2024-06-20: 600 mg via ORAL
  Filled 2024-06-20: qty 1

## 2024-06-20 MED ORDER — ACETAMINOPHEN 500 MG PO TABS
1000.0000 mg | ORAL_TABLET | Freq: Once | ORAL | Status: AC
  Administered 2024-06-20: 1000 mg via ORAL
  Filled 2024-06-20: qty 2

## 2024-06-20 NOTE — ED Provider Triage Note (Signed)
 Emergency Medicine Provider Triage Evaluation Note  Caitlin Ortega , a 38 y.o. female  was evaluated in triage.  Pt complains of right eye swelling, mild headache   Physical Exam  BP 125/79 (BP Location: Right Arm)   Pulse 64   Temp 99.5 F (37.5 C) (Oral)   Resp 17   Ht 1.676 m (5' 6)   Wt 75.8 kg   LMP  (LMP Unknown)   SpO2 100%   BMI 26.95 kg/m  Gen:   Awake, no distress   Resp:  Normal effort  MSK:   Moves extremities without difficulty  Other:  Mild right upper eyelid swelling, mild erythema, EOMI  Medical Decision Making  Medically screening exam initiated at 1:33 PM.  Appropriate orders placed.  Dyjxpj Caitlin Ortega was informed that the remainder of the evaluation will be completed by another provider, this initial triage assessment does not replace that evaluation, and the importance of remaining in the ED until their evaluation is complete.     Caitlin Charleston, MD 06/20/24 [phone]
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
## 2024-06-20 NOTE — ED Provider Notes (Signed)
 [facility] Provider Note    Event Date/Time   First MD Initiated Contact with Patient 06/20/24 1419     (approximate)   History   Headache   HPI  Caitlin Ortega is a 38 y.o. female who presents to the ED for evaluation of Headache   Review a [facility] neurology clinic visit from 10 days ago.  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  Patient presents to the ED for evaluation of a few hours of occipital headache.  Reports pain to the top of the neck/base of the skull that radiates up and over the top of her head.  Has not yet taken any medications for headache  Also reporting 1 year of intermittent vertiginous dizziness that she attributes to her IIH.  Reports compliance with her acetazolamide.  No vision changes, syncope, fever   Physical Exam   Triage Vital Signs: ED Triage Vitals  Encounter Vitals Group     BP 06/20/24 1325 125/79     Girls Systolic BP Percentile --      Girls Diastolic BP Percentile --      Boys Systolic BP Percentile --      Boys Diastolic BP Percentile --      Pulse Rate 06/20/24 1325 64     Resp 06/20/24 1325 17     Temp 06/20/24 1325 99.5 F (37.5 C)     Temp Source 06/20/24 1325 Oral     SpO2 06/20/24 1325 100 %     Weight 06/20/24 1328 167 lb (75.8 kg)     Height 06/20/24 1328 5' 6 (1.676 m)     Head Circumference --      Peak Flow --      Pain Score 06/20/24 1326 7     Pain Loc --      Pain Education --      Exclude from Growth Chart --     Most recent vital signs: Vitals:   06/20/24 1325  BP: 125/79  Pulse: 64  Resp: 17  Temp: 99.5 F (37.5 C)  SpO2: 100%    General: Awake, no distress.  Mild tenderness to palpation of the paraspinal neck without skin changes, rash or swelling.  Tenderness to the occiput Sitting upright, pleasant and conversational in full sentences.  Fully ranging head and neck CV:  Good peripheral perfusion.  Resp:  Normal effort.  Abd:  No distention.   MSK:  No deformity noted.  Neuro:  No focal deficits appreciated. Cranial nerves II through XII intact 5/5 strength and sensation in all 4 extremities Other:  No meningeal features   ED Results / Procedures / Treatments   Labs (all labs ordered are listed, but only abnormal results are displayed) Labs Reviewed - No data to display  EKG   RADIOLOGY   Official radiology report(s): No results found.  PROCEDURES and INTERVENTIONS:  Procedures  Medications  acetaminophen  (TYLENOL ) tablet 1,000 mg (1,000 mg Oral Given 06/20/24 1559)  ibuprofen  (ADVIL ) tablet 600 mg (600 mg Oral Given 06/20/24 1600)     IMPRESSION / MDM / ASSESSMENT AND PLAN / ED COURSE  I reviewed the triage vital signs and the nursing notes.  Differential diagnosis includes, but is not limited to, tension headache, migraine headache, meningitis or encephalitis, torticollis, ICH, hydrocephalus  {Patient presents with symptoms of an acute illness or injury that is potentially life-threatening.  Patient presents with symptoms of a tension headache suitable for outpatient management.  Reassuring exam without meningeal features, neurodeficits, signs of trauma or other concerning features.  Discussed ED return precautions and care at home.     FINAL CLINICAL IMPRESSION(S) / ED DIAGNOSES   Final diagnoses:  Acute non intractable tension-type headache  Bad headache     Rx / DC Orders   ED Discharge Orders     None        Note:  This document was prepared using Dragon voice recognition software and may include unintentional dictation errors.   Claudene Rover, MD 06/20/24 [phone]

## 2024-06-20 NOTE — ED Notes (Signed)
 Pt discharged to home.  AVS reviewed and pt verbalized understanding.  Pt has no questions at this time.

## 2024-06-20 NOTE — Discharge Instructions (Signed)
Please take Tylenol and ibuprofen/Advil for your pain.  It is safe to take them together, or to alternate them every few hours.  Take up to 1000mg of Tylenol at a time, up to 4 times per day.  Do not take more than 4000 mg of Tylenol in 24 hours.  For ibuprofen, take 400-600 mg, 3 - 4 times per day.
